# Patient Record
Sex: Male | Born: 1940 | Race: White | Hispanic: No | Marital: Married | Smoking: Former smoker
Health system: Southern US, Community
[De-identification: ages and names within clinical notes are randomized; demographics above are authoritative.]

## PROBLEM LIST (undated history)

## (undated) DIAGNOSIS — G8194 Hemiplegia, unspecified affecting left nondominant side: Secondary | ICD-10-CM

## (undated) DIAGNOSIS — I4891 Unspecified atrial fibrillation: Secondary | ICD-10-CM

## (undated) DIAGNOSIS — J45909 Unspecified asthma, uncomplicated: Secondary | ICD-10-CM

## (undated) DIAGNOSIS — N200 Calculus of kidney: Secondary | ICD-10-CM

## (undated) DIAGNOSIS — G4730 Sleep apnea, unspecified: Secondary | ICD-10-CM

## (undated) DIAGNOSIS — R0600 Dyspnea, unspecified: Secondary | ICD-10-CM

## (undated) DIAGNOSIS — I10 Essential (primary) hypertension: Secondary | ICD-10-CM

## (undated) DIAGNOSIS — J449 Chronic obstructive pulmonary disease, unspecified: Secondary | ICD-10-CM

## (undated) DIAGNOSIS — G839 Paralytic syndrome, unspecified: Secondary | ICD-10-CM

## (undated) DIAGNOSIS — F419 Anxiety disorder, unspecified: Secondary | ICD-10-CM

## (undated) DIAGNOSIS — E785 Hyperlipidemia, unspecified: Secondary | ICD-10-CM

## (undated) DIAGNOSIS — M1990 Unspecified osteoarthritis, unspecified site: Secondary | ICD-10-CM

## (undated) DIAGNOSIS — R519 Headache, unspecified: Secondary | ICD-10-CM

## (undated) DIAGNOSIS — I63239 Cerebral infarction due to unspecified occlusion or stenosis of unspecified carotid arteries: Secondary | ICD-10-CM

## (undated) DIAGNOSIS — Z950 Presence of cardiac pacemaker: Secondary | ICD-10-CM

## (undated) DIAGNOSIS — E119 Type 2 diabetes mellitus without complications: Secondary | ICD-10-CM

## (undated) DIAGNOSIS — I639 Cerebral infarction, unspecified: Secondary | ICD-10-CM

## (undated) DIAGNOSIS — F0390 Unspecified dementia without behavioral disturbance: Secondary | ICD-10-CM

## (undated) DIAGNOSIS — K219 Gastro-esophageal reflux disease without esophagitis: Secondary | ICD-10-CM

## (undated) DIAGNOSIS — R251 Tremor, unspecified: Secondary | ICD-10-CM

## (undated) HISTORY — DX: Essential (primary) hypertension: I10

## (undated) HISTORY — PX: PEG TUBE PLACEMENT: SUR1034

## (undated) HISTORY — PX: CORONARY ANGIOPLASTY: SHX604

## (undated) HISTORY — DX: Tremor, unspecified: R25.1

## (undated) HISTORY — DX: Hemiplegia, unspecified affecting left nondominant side: G81.94

## (undated) HISTORY — PX: APPENDECTOMY: SHX54

## (undated) HISTORY — PX: HERNIA REPAIR: SHX51

## (undated) HISTORY — PX: ESOPHAGOGASTRODUODENOSCOPY: SHX1529

## (undated) HISTORY — DX: Cerebral infarction due to unspecified occlusion or stenosis of unspecified carotid artery: I63.239

## (undated) HISTORY — DX: Hyperlipidemia, unspecified: E78.5

## (undated) HISTORY — PX: PACEMAKER INSERTION: SHX728

---

## 2006-04-06 ENCOUNTER — Ambulatory Visit: Payer: Self-pay | Admitting: Surgery

## 2006-04-11 ENCOUNTER — Other Ambulatory Visit: Payer: Self-pay

## 2006-04-11 ENCOUNTER — Ambulatory Visit: Payer: Self-pay | Admitting: Surgery

## 2007-11-20 ENCOUNTER — Ambulatory Visit: Payer: Self-pay | Admitting: Surgery

## 2008-12-31 ENCOUNTER — Ambulatory Visit: Payer: Self-pay | Admitting: Internal Medicine

## 2009-01-20 ENCOUNTER — Ambulatory Visit: Payer: Self-pay | Admitting: Internal Medicine

## 2009-02-18 DIAGNOSIS — E785 Hyperlipidemia, unspecified: Secondary | ICD-10-CM

## 2009-02-18 HISTORY — DX: Hyperlipidemia, unspecified: E78.5

## 2011-01-28 ENCOUNTER — Inpatient Hospital Stay: Payer: Self-pay | Admitting: Surgery

## 2011-01-31 ENCOUNTER — Ambulatory Visit: Payer: Self-pay | Admitting: Surgery

## 2011-01-31 LAB — PATHOLOGY REPORT

## 2011-05-24 ENCOUNTER — Ambulatory Visit: Payer: Self-pay | Admitting: Internal Medicine

## 2011-06-13 ENCOUNTER — Ambulatory Visit: Payer: Self-pay | Admitting: Internal Medicine

## 2011-08-02 LAB — CBC
HGB: 13.7 g/dL (ref 13.0–18.0)
MCH: 30.1 pg (ref 26.0–34.0)
MCHC: 33.7 g/dL (ref 32.0–36.0)
MCV: 90 fL (ref 80–100)
RBC: 4.54 10*6/uL (ref 4.40–5.90)
RDW: 14.9 % — ABNORMAL HIGH (ref 11.5–14.5)

## 2011-08-02 LAB — CK TOTAL AND CKMB (NOT AT ARMC)
CK, Total: 83 U/L (ref 35–232)
CK-MB: 1.4 ng/mL (ref 0.5–3.6)

## 2011-08-02 LAB — BASIC METABOLIC PANEL
Calcium, Total: 8.6 mg/dL (ref 8.5–10.1)
Chloride: 108 mmol/L — ABNORMAL HIGH (ref 98–107)
EGFR (African American): >60
EGFR (Non-African Amer.): >60
Glucose: 118 mg/dL — ABNORMAL HIGH (ref 65–99)
Osmolality: 283 (ref 275–301)
Potassium: 4.5 mmol/L (ref 3.5–5.1)
Sodium: 141 mmol/L (ref 136–145)

## 2011-08-02 LAB — TROPONIN I: Troponin-I: <0.02 ng/mL

## 2011-08-03 ENCOUNTER — Inpatient Hospital Stay: Payer: Self-pay | Admitting: Internal Medicine

## 2011-08-03 LAB — URINALYSIS, COMPLETE
Bacteria: NONE SEEN
Glucose,UR: NEGATIVE mg/dL (ref 0–75)
Ketone: NEGATIVE
Ph: 6 (ref 4.5–8.0)
Protein: NEGATIVE
RBC,UR: <1 /HPF (ref 0–5)
Specific Gravity: 1.005 (ref 1.003–1.030)
Squamous Epithelial: NONE SEEN

## 2011-08-03 LAB — PROTIME-INR
INR: 1.8
Prothrombin Time: 21.2 secs — ABNORMAL HIGH (ref 11.5–14.7)

## 2011-08-03 LAB — TROPONIN I: Troponin-I: <0.02 ng/mL

## 2011-08-03 LAB — TSH: Thyroid Stimulating Horm: 5.6 u[IU]/mL — ABNORMAL HIGH

## 2011-08-04 LAB — PROTIME-INR: INR: 1.9

## 2011-09-19 ENCOUNTER — Inpatient Hospital Stay: Payer: Self-pay | Admitting: Internal Medicine

## 2011-09-19 LAB — COMPREHENSIVE METABOLIC PANEL
Albumin: 3.9 g/dL (ref 3.4–5.0)
Anion Gap: 8 (ref 7–16)
BUN: 15 mg/dL (ref 7–18)
Co2: 26 mmol/L (ref 21–32)
Glucose: 102 mg/dL — ABNORMAL HIGH (ref 65–99)
Potassium: 4.1 mmol/L (ref 3.5–5.1)
SGOT(AST): 29 U/L (ref 15–37)
Total Protein: 7.4 g/dL (ref 6.4–8.2)

## 2011-09-19 LAB — CK TOTAL AND CKMB (NOT AT ARMC)
CK, Total: 135 U/L (ref 35–232)
CK-MB: 1.3 ng/mL (ref 0.5–3.6)

## 2011-09-19 LAB — CBC
HGB: 12.4 g/dL — ABNORMAL LOW (ref 13.0–18.0)
MCH: 30.4 pg (ref 26.0–34.0)
MCV: 92 fL (ref 80–100)
RBC: 4.07 10*6/uL — ABNORMAL LOW (ref 4.40–5.90)

## 2011-09-19 LAB — PROTIME-INR
INR: 2.1
Prothrombin Time: 24 secs — ABNORMAL HIGH (ref 11.5–14.7)

## 2011-09-19 LAB — TROPONIN I: Troponin-I: <0.02 ng/mL

## 2011-09-20 LAB — CBC WITH DIFFERENTIAL/PLATELET
Basophil #: 0.1 10*3/uL (ref 0.0–0.1)
Basophil %: 1.1 %
Eosinophil #: 0.1 10*3/uL (ref 0.0–0.7)
Eosinophil %: 2.1 %
HGB: 11.7 g/dL — ABNORMAL LOW (ref 13.0–18.0)
Lymphocyte #: 1.8 10*3/uL (ref 1.0–3.6)
Lymphocyte %: 32.6 %
MCHC: 33.8 g/dL (ref 32.0–36.0)
Neutrophil %: 53.7 %
Platelet: 138 10*3/uL — ABNORMAL LOW (ref 150–440)
RBC: 3.83 10*6/uL — ABNORMAL LOW (ref 4.40–5.90)
RDW: 15.2 % — ABNORMAL HIGH (ref 11.5–14.5)

## 2011-09-20 LAB — LIPID PANEL
Cholesterol: 109 mg/dL (ref 0–200)
HDL Cholesterol: 42 mg/dL (ref 40–60)
Ldl Cholesterol, Calc: 56 mg/dL (ref 0–100)
Triglycerides: 54 mg/dL (ref 0–200)
VLDL Cholesterol, Calc: 11 mg/dL (ref 5–40)

## 2011-09-20 LAB — BASIC METABOLIC PANEL
Anion Gap: 10 (ref 7–16)
Calcium, Total: 8.6 mg/dL (ref 8.5–10.1)
Chloride: 108 mmol/L — ABNORMAL HIGH (ref 98–107)
Creatinine: 0.87 mg/dL (ref 0.60–1.30)
EGFR (African American): >60
EGFR (Non-African Amer.): >60
Glucose: 106 mg/dL — ABNORMAL HIGH (ref 65–99)
Osmolality: 286 (ref 275–301)

## 2011-09-20 LAB — MAGNESIUM: Magnesium: 2 mg/dL

## 2011-09-20 LAB — HEMOGLOBIN A1C: Hemoglobin A1C: 7.5 % — ABNORMAL HIGH (ref 4.2–6.3)

## 2013-10-13 DIAGNOSIS — M0579 Rheumatoid arthritis with rheumatoid factor of multiple sites without organ or systems involvement: Secondary | ICD-10-CM | POA: Insufficient documentation

## 2013-10-13 DIAGNOSIS — E7800 Pure hypercholesterolemia, unspecified: Secondary | ICD-10-CM | POA: Insufficient documentation

## 2013-10-16 DIAGNOSIS — Z79899 Other long term (current) drug therapy: Secondary | ICD-10-CM | POA: Insufficient documentation

## 2014-01-24 DIAGNOSIS — Z7901 Long term (current) use of anticoagulants: Secondary | ICD-10-CM | POA: Insufficient documentation

## 2014-07-06 NOTE — Consult Note (Signed)
PATIENT NAME:  Oscar, Brock MR#:  [id number] DATE OF BIRTH:  [birth year]  DATE OF CONSULTATION:  08/03/2011  REFERRING PHYSICIAN:   CONSULTING PHYSICIAN:  Marcina Millard, MD  PRIMARY CARE PHYSICIAN: Mickey Farber, MD   PRIMARY CARDIOLOGIST: Arnoldo Hooker, MD   ELECTROPHYSIOLOGIST: Dr. Tedra Senegal, [facility]   CHIEF COMPLAINT: Palpitations.   HISTORY OF PRESENT ILLNESS: The patient is a 74 year old gentleman with known history of paroxysmal atrial fibrillation. The patient has had a longstanding history of paroxysmal atrial fibrillation. He has undergone previous cardioversions. He has most recently been on Tikosyn for the past three years. He has done relatively well with intermittent palpitations. He presented on 08/03/2011 with tachycardia with the patient knew was atrial fibrillation. When he presented to [facility] Emergency Room, EKG revealed atrial fibrillation with a rapid ventricular response with rates in the 120's to 130 bpm. The patient was admitted to telemetry where his rate has improved on his current outpatient medications including Cardizem. The patient still feels that he is in atrial fibrillation, appears to be symptomatic. He is anticoagulated with warfarin.   PAST MEDICAL HISTORY:  1. Paroxysmal atrial fibrillation.  2. Hypertension.  3. History of coronary artery disease.  4. Hyperlipidemia.  5. Rheumatoid arthritis.   MEDICATIONS:  1. Aspirin 81 mg daily.  2. Coumadin 7 mg daily.  3. Atorvastatin 20 mg daily.  4. Tikosyn 500 mg b.i.d.  5. Benadryl 25 mg t.i.d. p.r.n.  6. Finasteride 5 mg daily.  7. Magnesium gluconate 250 mg b.i.d.  8. Methotrexate 2.5 mg 7 tablets q. weekly.  9. Vicodin 5/500 1 to 2 tablets q.6 p.r.n.   SOCIAL HISTORY: The patient is married. Lives with his wife. He denies tobacco or EtOH abuse.   FAMILY HISTORY: Mother and father both with a history of congestive heart failure.   REVIEW OF SYSTEMS: CONSTITUTIONAL: No fever or chills.  EYES: No blurry vision. EARS: No hearing loss. RESPIRATORY: No shortness of breath. CARDIOVASCULAR: Palpitations and atrial fibrillation as stated above. GI: No nausea, vomiting, diarrhea, or constipation. GU: No dysuria or hematuria. ENDOCRINE: No polyuria or polydipsia. MUSCULOSKELETAL: No arthralgias or myalgias. NEUROLOGICAL: No focal muscle weakness or numbness. PSYCHOLOGICAL: No depression or anxiety.   PHYSICAL EXAMINATION:   VITAL SIGNS: Blood pressure 116/70, pulse 72, respirations 18, temperature 98.6, pulse oximetry 96%.   HEENT: Pupils equal and reactive to light and accommodation.   NECK: Supple without thyromegaly.   LUNGS: Clear.   CARDIOVASCULAR: Normal JVP. Normal PMI. Irregular irregular rhythm. Normal S1, S2. No appreciable gallop, murmur, or rub.   ABDOMEN: Soft, nontender. Pulses were intact bilaterally.   MUSCULOSKELETAL: Normal muscle tone.   NEUROLOGIC: The patient is alert and oriented x3. Motor and sensory both grossly intact.   IMPRESSION: This is a 74 year old gentleman with a history of paroxysmal atrial fibrillation who presents with a prolonged episode of atrial fibrillation. The patient has ruled out for myocardial infarction by CPK isoenzymes and troponin. The patient is subtherapeutic on warfarin with an INR of 1.8.   RECOMMENDATIONS:  1. Adjust warfarin dose to achieve target INR of 2 to 3. 2. Agree with low dose Cardizem which appears to be beneficial in improving ventricular rate.  3. Consider repeat cardioversion while patient is still on Tikosyn with the addition of low dose Cardizem. 4. Consider referral to Dr. Tedra Senegal for possible catheter ablation of atrial fibrillation which has failed Tikosyn.  5. Will discuss further options with Dr. Gwen Pounds who is his primary cardiologist.  ____________________________  Marcina Millard, MD ap:drc D: 08/03/2011 13:53:20 ET T: 08/03/2011 14:20:18 ET JOB#: [id number]  cc: Marcina Millard, MD,  <Dictator> Marcina Millard MD ELECTRONICALLY SIGNED 08/25/2011 17:39

## 2014-07-06 NOTE — Consult Note (Signed)
PATIENT NAME:  Oscar Brock, Oscar Brock MR#:  [id number] DATE OF BIRTH:  03/14/1941  DATE OF CONSULTATION:  09/20/2011  REFERRING PHYSICIAN:  Dr. Shaune Pollack  CONSULTING PHYSICIAN:  Hemang K. Sherryll Burger, MD  PRIMARY CARE PHYSICIAN: Dr. Mickey Farber CARDIOLOGIST: Dr. Gwen Pounds  REASON FOR CONSULTATION: Left upper extremity weakness.   HISTORY OF PRESENT ILLNESS: Mr. Bangs is a 74 year old Caucasian gentleman with a history of proximal atrial fibrillation, had an acute onset of left upper extremity unusual sensation such as something running through his vein around 11:30 on 09/19/2011.   All of a sudden his left upper extremity became limp and he could not move it for almost 20 minutes and he then started having some strength back.   At that time he also had some blurred vision and felt a little bit dizzy.   He did not have any palpitation sensation at that time. His hand stayed a little bit weak all day but the next morning it has been almost back to normal.   Patient has been on anticoagulation with Coumadin due to paroxysmal atrial fibrillation since around 2003 or so.   He also takes 81 mg aspirin but he has frequent bruising.   Patient denied any history suggestive of transient ischemic attack or stroke before that.   PAST MEDICAL HISTORY:  1. Paroxysmal atrial fibrillation on Tikosyn. 2. Hypertension. 3. Coronary artery disease. 4. Rheumatoid arthritis.  5. Hyperlipidemia.  6. Diet-controlled diabetes. 7. Valvular heart disease.   PAST SURGICAL HISTORY:  1. Hernia repair.  2. Appendectomy.  3. History of angioplasty.   SOCIAL HISTORY: Significant that he is married. He lives with his wife. He is retired from [employer]. He does not use any tobacco, alcohol or drugs.   FAMILY HISTORY: Significant for both the parents with congestive heart failure, brother with a stroke, and sister with transient ischemic attack.   ALLERGIES: He is allergic to Darvocet and it causes distress.    MEDICATIONS: I reviewed home medication list.   REVIEW OF SYSTEMS: I asked him 10 system review of system which was negative except history of present illness.   PHYSICAL EXAMINATION:  GENERAL: He is tall Caucasian gentleman here with his wife wearing glasses in a hospital gown.   VITAL SIGNS: Temperature 98, pulse 48, respiratory rate 16, blood pressure 126/68, pulse oximetry 98%.   LUNGS: Clear to auscultation.   HEART: S1, S2 heart sounds, regular. Carotid exam did not reveal any bruit.   Funduscopic exam was attempted.   MENTAL STATUS EXAMINATION: He was alert, oriented. He followed two-step inverted commands. His language seems to be intact. His attention, concentration, and memory seem to be appropriate for his age and medical condition.   On his cranial nerves his pupils were equal, round, and reactive. Extraocular movements are intact. His visual fields seemed full. He has decreased hearing bilaterally. His face was symmetric. Tongue was midline. Facial sensations were intact.   His shoulder shrug was 5/5.   On his motor examination, he has normal tone and strength of 5/5 including his left upper extremity. He was not able to snap his fingers because of his long-standing arthritis.   His sensations were intact to light touch except some decreased sensation in both feet and hammer toes.   His reflexes were intact except he has decreased ankle jerk bilaterally.   His gait was slow and a little bit unsteady but otherwise unremarkable and he feels like his baseline.   LABORATORY, DIAGNOSTIC AND RADIOLOGICAL DATA: I  reviewed his MRI of the brain which showed a very small punctate area of DWI hyperintensity corresponding with ADC hypointensity.   He also has some other white matter microvascular ischemic changes.   I reviewed his other radiological data which was unremarkable.   His labs were also okay.   He has mild elevation of his chloride. His hemoglobin A1c was 7.5.    He is mildly anemic.   His INR was 2.1.   ASSESSMENT AND PLAN:  1. Ischemic stroke, acute onset of left upper extremity weakness lasting for 20 minutes, complete paralysis but having some apraxia and some weakness for almost more than 12 hours likely represent stroke. He also has a very small DWI change on his MRI of the brain which will classify him as a "minor stroke".    I will hopeful for him that he will recover completely from this injury but I am afraid that he is at high risk of having recurrent infarct.   I talked to the patient about signs and symptoms of stroke and importance of calling 911.   He will be a tough TPA decision because his INR is usually therapeutic and therapeutic INR he won't be able to get TPA even though he has acute onset of strokelike symptoms.   I explained this dilemma to the patient.   Right now his ischemia seemed to be in his right middle cerebral artery territory small branch infarct.   I tried to explain the pathophysiology to the patient of embolic phenomenon with self or spontaneous recanalization, etc.   Patient needs to have better control of his primary risk factors. He is already on Coumadin for his atrial fibrillation and now he is on sinus rhythm.   He still has some bradycardia but I will let his Dr. Gwen Pounds or his hospitalist physician make a decision on Cardizem.   His blood pressure is well controlled. He needs to have tighter control of his diabetes.   He has not been smoking for at least 23 years.   I talked to him about other ways of preventing his recurrent strokes.   2. Patient explained that he has occasional zigzag lines in front of his eyes, happens once a month or so, can last for around 2 to 5 minutes which sounds like migraine accompaniment.   He is already taking magnesium supplement. He can consider taking dolovent.   3. History of concussion. Wife wanted to know whether he has any signs of concussion on his MRI of  the brain but on asking further details it actually happened when he was [age] years of age and he had concussion symptoms for only one day.   I explained to the wife that we might not see any changes on his MRI of the brain now.   I will see him back in the clinic in 1 to 2 months.  Feel free to contact me with any further questions. Patient will continue to take his aspirin and Coumadin. There is no change in medication from my side.   ____________________________ Durene Cal. Sherryll Burger, MD hks:cms D: 09/20/2011 19:16:02 ET T: 09/21/2011 07:39:56 ET JOB#: [id number]  cc: Hemang K. Sherryll Burger, MD, <Dictator> Neomia Dear. Harrington Challenger, MD Lamar Blinks, MD Durene Cal [facility] MD ELECTRONICALLY SIGNED 09/28/2011 7:59

## 2014-07-06 NOTE — Consult Note (Signed)
Past Medical/Surgical Hx:  Hyperlipidemia:   MI - Myocardial Infarct:   Borderline Diabetes:   Rheumatoid Arthritis:   Atrial Fibrillation:   Appendectomy:   Hernia Repair:   Angioplasty:   Home Medications: Medication Instructions Last Modified Date/Time  Tikosyn 500 mcg oral capsule 1 cap(s) orally 2 times a day 08-Jul-13 20:35  atorvastatin 20 mg oral tablet 1 tab(s) orally once a day (at bedtime) 08-Jul-13 20:35  aspirin delayed release capsule 81 mg 1 cap(s) orally once a day 08-Jul-13 20:35  Benadryl Dye Free Allergy 25 mg oral tablet 1 tab(s) orally once a day (at bedtime) and As Needed for allergies  08-Jul-13 20:35  Cardizem CD 120 mg/24 hours oral capsule, extended release 1 cap(s) orally 2 times a day 08-Jul-13 20:35  Coumadin 6 mg oral tablet 1 tab(s) orally once a day 08-Jul-13 20:35  Coumadin 1 mg oral tablet 1 tab(s) orally once a day 08-Jul-13 20:35  finasteride 5 mg oral tablet 1 tab(s) orally once a day 08-Jul-13 20:35  magnesium gluconate 250 mg oral tablet 1 tab(s) orally 3 times a day 08-Jul-13 20:35  methotrexate tablet 2.5 mg 7 tab(s) orally once a week 08-Jul-13 20:35  Vicodin 5 mg-500 mg oral tablet 1 tab(s) orally every 6 hours, As Needed- for Pain  08-Jul-13 20:35  omeprazole 20 mg oral delayed release capsule 1 cap(s) orally once a day 08-Jul-13 20:35   Allergies:  Darvocet - N: GI Distress  Vital Signs: **Vital Signs.:   09-Jul-13 15:56   Temperature Temperature (F) 98   Celsius 36.6   Temperature Source oral   Pulse Pulse 48   Systolic BP Systolic BP 235   Diastolic BP (mmHg) Diastolic BP (mmHg) 68   Mean BP 87   Pulse Ox % Pulse Ox % 98   Pulse Ox Activity Level  At rest   Oxygen Delivery Room Air/ 21 %   Lab Results: Hepatic:  08-Jul-13 14:05    Bilirubin, Total 0.9   Alkaline Phosphatase 69   SGPT (ALT) 41 (12-78 NOTE: NEW REFERENCE RANGE 02/04/2011)   SGOT (AST) 29   Total Protein, Serum 7.4   Albumin, Serum 3.9  Routine Chem:   09-Jul-13 04:33    Glucose, Serum  106   BUN 13   Creatinine (comp) 0.87   Sodium, Serum 143   Potassium, Serum 4.4   Chloride, Serum  108   CO2, Serum 25   Calcium (Total), Serum 8.6   Anion Gap 10   Osmolality (calc) 286   eGFR (African American) >60   eGFR (Non-African American) >60 (eGFR values <60m/min/1.73 m2 may be an indication of chronic kidney disease (CKD). Calculated eGFR is useful in patients with stable renal function. The eGFR calculation will not be reliable in acutely ill patients when serum creatinine is changing rapidly. It is not useful in  patients on dialysis. The eGFR calculation may not be applicable to patients at the low and high extremes of body sizes, pregnant women, and vegetarians.)   Magnesium, Serum 2.0 (1.8-2.4 THERAPEUTIC RANGE: 4-7 mg/dL TOXIC: > 10 mg/dL  -----------------------)   Hemoglobin A1c ([facility])  7.5 (The American Diabetes Association recommends that a primary goal of therapy should be <7% and that physicians should reevaluate the treatment regimen in patients with HbA1c values consistently >8%.)   Cholesterol, Serum 109   Triglycerides, Serum 54   HDL (INHOUSE) 42   VLDL Cholesterol Calculated 11   LDL Cholesterol Calculated 56 (Result(s) reported on 20 Sep 2011 at 05:35AM.)  Cardiac:  08-Jul-13 14:05    Troponin I < 0.02 (0.00-0.05 0.05 ng/mL or less: NEGATIVE  Repeat testing in 3-6 hrs  if clinically indicated. >0.05 ng/mL: POTENTIAL  MYOCARDIAL INJURY. Repeat  testing in 3-6 hrs if  clinically indicated. NOTE: An increase or decrease  of 30% or more on serial  testing suggests a  clinically important change)   CK, Total 135   CPK-MB, Serum 1.3 (Result(s) reported on 19 Sep 2011 at 02:52PM.)  Routine Coag:  08-Jul-13 14:05    Prothrombin  24.0   INR 2.1 (INR reference interval applies to patients on anticoagulant therapy. A single INR therapeutic range for coumarins is not optimal for all indications; however, the  suggested range for most indications is 2.0 - 3.0. Exceptions to the INR Reference Range may include: Prosthetic heart valves, acute myocardial infarction, prevention of myocardial infarction, and combinations of aspirin and anticoagulant. The need for a higher or lower target INR must be assessed individually. Reference: The Pharmacology and Management of the Vitamin K  antagonists: the seventh ACCP Conference on Antithrombotic and Thrombolytic Therapy. BWIOM.3559 Sept:126 (3suppl): [id number]. A HCT value >55% may artifactually increase the PT.  In one study,  the increase was an average of 25%. Reference:  "Effect on Routine and Special Coagulation Testing Values of Citrate Anticoagulant Adjustment in Patients with High HCT Values." American Journal of Clinical Pathology 2006;126:400-405.)  Routine Hem:  09-Jul-13 04:33    WBC (CBC) 5.6   RBC (CBC)  3.83   Hemoglobin (CBC)  11.7   Hematocrit (CBC)  34.6   Platelet Count (CBC)  138   MCV 90   MCH 30.5   MCHC 33.8   RDW  15.2   Neutrophil % 53.7   Lymphocyte % 32.6   Monocyte % 10.5   Eosinophil % 2.1   Basophil % 1.1   Neutrophil # 3.0   Lymphocyte # 1.8   Monocyte # 0.6   Eosinophil # 0.1   Basophil # 0.1 (Result(s) reported on 20 Sep 2011 at 05:15AM.)   Radiology Results: [place]:    08-Jul-13 17:32, US Carotid Doppler Bilateral   US Carotid Doppler Bilateral    REASON FOR EXAM:    eval for stenosis  COMMENTS:       PROCEDURE: [place]  - US CAROTID DOPPLER BILATERAL  - Sep 19 2011  5:32PM     RESULT: Comparison: None    Technique: Gray-scale, color Doppler, and spectral Doppler images were   obtained of the extracranial carotid artery systems and vertebral   arteries in the neck.    Findings:  Mild atherosclerotic plaques are demonstrated in the carotid bulbs and   internal carotid arteries bilaterally. By visual inspection, there is   less than 50% stenosis.The peak systolic velocities are not elevated.     The right  ICA to CCA ratio is 1.3. The left ICA to CCA ratio is 0.7.    The vertebral arteries are patent bilaterally.    IMPRESSION:    No evidence of hemodynamically significant stenosis in the extracranial   carotid arteries.      Dictation Site: 8          Verified By: Gregor Hams, M.D., MD  MRI:    09-Jul-13 13:43, MRI Brain Without Contrast   MRI Brain Without Contrast    REASON FOR EXAM:    CVA  COMMENTS:       PROCEDURE: MR  - MR BRAIN WO CONTRAST  -  Sep 20 2011  1:43PM     RESULT: History: CVA.    Comparison Study: Head CT of 09/19/2011 .    Findings: Multiplanar, multisequence imaging the brain is obtained.   Diffusion-weighted images reveal punctate region of  acute ischemia in   the posterior right parietal cortex seen on image #21. Posterior fossa is   unremarkable. White matter changes noted consistent chronic ischemia   noted.    IMPRESSION:  Punctate area of acute ischemia noted in the posterior right   parietal cortex .          Verified By: Osa Craver, M.D., MD  CT:    08-Jul-13 15:03, CT Head Without Contrast   CT Head Without Contrast    REASON FOR EXAM:    left arm weakness  - on coumadin   eval for cva/bleed  COMMENTS:       PROCEDURE: CT  - CT HEAD WITHOUT CONTRAST  - Sep 19 2011  3:03PM     RESULT: Comparison:  None    Technique: Multiple axial images from the foramen magnum to the vertex   were obtained without IV contrast.    Findings:      There is no evidence of mass effect, midline shift, or extra-axial fluid   collections.  There is no evidence of a space-occupying lesion or   intracranial hemorrhage. There is no evidence of a cortical-based area of     acute infarction.      The ventricles and sulci are appropriate for the patient's age. The basal   cisterns are patent.    Visualized portions of the orbits are unremarkable. The visualized   portions of the paranasal sinuses and mastoid air cells are unremarkable.      The osseous structures are unremarkable.    IMPRESSION:      No acute intracranial process.      Dictation Site: 1          Verified By: Jennette Banker, M.D., MD   Impression/Recommendations:  Recommendations:   Please see my dictation for details.Tt. MCA very small (punctate) infract - causing left UE weakness which has resolved, known A.Fib on coumadin and ASA.of the work up unremarkable.explained pt stroke S/S and need to call 911 if he has any new stroke.He will be difficult tPA decision as he is usually therapeutic dose of comadin. Migraine accompnainmenth/o concussion - stablefor the opportunity to participate in care of your patient.free to contact me with any further questions on this pt, I will follow this patient on PRN basis. pt f/up 1-2 months.  Electronic Signatures: Ray Church (MD)  (Signed 09-Jul-13 17:50)  Authored: PAST MEDICAL/SURGICAL HISTORY, HOME MEDICATIONS, ALLERGIES, NURSING VITAL SIGNS, LAB RESULTS, RADIOLOGY RESULTS, Recommendations   Last Updated: 09-Jul-13 17:50 by Ray Church (MD)

## 2014-07-06 NOTE — H&P (Signed)
PATIENT NAME:  Oscar Brock, GANN MR#:  [id number] DATE OF BIRTH:  Jan 22, 1941  DATE OF ADMISSION:  09/19/2011  REFERRING PHYSICIAN: Jene Every, MD  PRIMARY CARE PHYSICIAN: Mickey Farber, MD  CARDIOLOGIST: Arnoldo Hooker, MD  CHIEF COMPLAINT: Left upper extremity weakness.   HISTORY OF PRESENT ILLNESS: The patient is a pleasant 74 year old Caucasian male with history of paroxysmal atrial fibrillation on Tikosyn and a calcium channel blocker for rate control and on Coumadin as well with therapeutic INR who presents with left upper extremity weakness. The patient started having the weakness about 11:30 and there were no other limbs involved. He also had some numbness. There was no speech disturbances or facial asymmetry per wife who was there. He went to his PCP who noted significant left upper extremity weakness, probably 2 out of 5, per the ER physician. EMS was called and the patient was brought here. He has a CT of the head which is negative for any acute intracranial pathology and INR is 2.1. His troponin is negative. The patient stated that he did feel some palpitations and skipping of his heart rate while he had the symptoms. Symptoms started to improve after 20 minutes and currently is much improved. EKG also shows sinus rhythm. Hospitalist service was contacted for further evaluation and management.   PAST MEDICAL HISTORY:  1. Paroxysmal atrial fibrillation, on Tikosyn.  2. Hypertension. 3. Coronary artery disease. 4. Rheumatoid arthritis.  5. Hyperlipidemia.  6. Diet-controlled diabetes. 7. Valvular heart disease.   PAST SURGICAL HISTORY:  1. Hernia repair. 2. Appendectomy. 3. History of angioplasty.   SOCIAL HISTORY: He is married and living with wife, retired from the [employer]. No tobacco, alcohol, or drug use.   FAMILY HISTORY: Both parents with congestive heart failure. Brother with stroke and sister with a transient ischemic attack.   ALLERGIES: Darvocet causes I  distress.   MEDICATIONS:  1. Aspirin 81 mg daily.  2. Lipitor 20 mg daily.  3. Benadryl dye free 25 mg once a day at bedtime as needed for allergies.  4. Cardizem CD 120 mg twice a day. 5. Coumadin 7 mg once a day. 6. Finasteride 5 mg daily.  7. Magnesium gluconate 250 mg three times daily. 8. Methotrexate 2.5 mg 7 tablets once a week, last took 09/15/2011. 9. Omeprazole 20 mg daily.  10. Tikosyn 500 mcg one cap twice a day. 11. Vicodin 5/500 mg one tab every six hours as needed for pain.   REVIEW OF SYSTEMS: CONSTITUTIONAL: No fever or fatigue. Some global weakness including weakness in the left upper extremity. No weight changes. EYES: Transient blurry vision, resolved. No glaucoma. ENT: No tinnitus or hearing loss. RESPIRATORY: No cough, wheezing, hemoptysis, or chronic obstructive pulmonary disease. CARDIOVASCULAR: No chest pain or orthopnea. Did have some palpitations earlier. Has history of high blood pressure and paroxysmal atrial fibrillation. GI: No nausea, vomiting, diarrhea, abdominal pain, or rectal bleeding. GENITOURINARY: Denies dysuria. Has some nocturia. No incontinence. ENDOCRINE: No polyuria, nocturia, or thyroid problems. HEME/LYMPH: No anemia or easy bruising. SKIN: No new rashes. MUSCULOSKELETAL: Has chronic arthritis and rheumatoid arthritis. NEURO: Numbness in the left upper extremity, much improved. Also weakness in the same limb. No history of cerebrovascular accident or transient ischemic attack. No seizures or dementia. PSYCH: No anxiety. Some insomnia.      PHYSICAL EXAMINATION:   VITAL SIGNS: Temperature on arrival 98.2, pulse rate 59, respiratory rate 18, blood pressure 131/70, and oxygen saturation 96% on room air.   GENERAL: The patient is  a pleasant Caucasian male lying in bed in no obvious distress talking in full sentences.   HEENT: Normocephalic, atraumatic. Pupils are equal and reactive. Extraocular muscles intact. Anicteric sclerae. Moist mucous  membranes.   NECK: Supple. No thyroid tenderness.   CARDIOVASCULAR: S1 and S2 bradycardic. No murmurs appreciated.   LUNGS: Clear to auscultation without wheezing or rales.   ABDOMEN: Soft, nontender, and nondistended. Normoactive bowel sounds in all quadrants. No organomegaly noted.   SKIN: No obvious rashes.   EXTREMITIES: No significant lower extremity edema.   NEUROLOGICAL: Cranial nerves II through XII appear to be grossly intact. Strength in the left upper extremity is 4+/5, otherwise 5 out of 5 in all the other extremities. Sensation is intact to light touch and equivocal. Rapid alternating movements intact. Intact nose-to-finger touch.  PSYCH: Awake, alert, and oriented x3. Pleasant and conversant.  LABORATORY, DIAGNOSTIC AND RADIOLOGIC DATA: Glucose 102, creatinine 1.03, sodium 142, potassium 4.1, chloride 108, and CO2 26. LFTs within normal limits. Troponin negative. CK-MB 1.3. WBC 6.9, hemoglobin 12.4, hematocrit 37.3, and platelets 159. INR is 2.1.   EKG shows sinus bradycardia at rate 54. No acute ST elevations or depressions. QTc 491 ms.   CT of the head without contrast shows no acute intracranial process.  ASSESSMENT AND PLAN: We have a pleasant 74 year old Caucasian male with paroxysmal atrial fibrillation, hypertension, hyperlipidemia, and rheumatoid arthritis presenting with left upper extremity weakness and numbness much improved, cerebrovascular accident likely versus transient ischemic attack. At this point, his strength is much improved, is 4 out of 5 without any other neurological deficits, the face is symmetrical and a CT of the head is negative. The patient is on Coumadin and INR is therapeutic. The patient did have some palpitations at the time of the symptoms. I will place him on telemetry to evaluate any arrhythmias. I would check carotid ultrasounds as well as a MRI of the brain to look for evidence for CVA. I did discuss the case with Dr. Gwen Pounds, his  cardiologist, and at this point he is in sinus, rate controlled, has a therapeutic INR and resolving symptoms. He recommended continuing the Coumadin for now and would also check a MRI as well as ultrasound to look for any blockages and resume the statin. The blood pressure is stable. Would check lipids. Would also check a hemoglobin A1c. We would also consult with cardiology for further recommendations.  CODE STATUS: THE PATIENT IS FULL CODE.   TOTAL TIME SPENT: 50 minutes.  ____________________________ Krystal Eaton, MD sa:slb D: 09/19/2011 16:53:08 ET T: 09/19/2011 17:09:54 ET JOB#: [id number]  cc: Krystal Eaton, MD, <Dictator> Neomia Dear. Harrington Challenger, MD Krystal Eaton MD ELECTRONICALLY SIGNED 10/03/2011 16:16

## 2014-07-06 NOTE — Discharge Summary (Signed)
PATIENT NAME:  Oscar Brock, Oscar Brock MR#:  [id number] DATE OF BIRTH:  June 15, 1940  DATE OF ADMISSION:  08/03/2011 DATE OF DISCHARGE:  08/04/2011  PRESENTING COMPLAINT: Palpitations and chest pain along with lightheadedness.   DISCHARGE DIAGNOSES:  1. Recurrent atrial fibrillation, now in sinus rhythm.    MEDICATIONS: 1. Finasteride 5 mg p.o. daily.  2. Vicodin 5/500, 2 tablets q. 6 p.r.n.  3. Methotrexate 2.5 mg, 7 tablets once a week.  4. Aspirin 81 mg daily.  5. Coumadin 7 mg daily.  6. Tikosyn 500 mcg p.o. b.i.d.  7. Magnesium gluconate 250 mg p.o. twice a day.  8. Atorvastatin 20 mg p.o. at bedtime.  9. Benadryl 25 mg 1 tablet 3 times a day as needed.  10. Cardizem CD 120 mg p.o. daily.   FOLLOWUP: Follow up with Dr. Gwen Pounds Monday June 03 at 3:00 p.m.   PROCEDURES: None.   CONSULTATIONS:  Cardiology consultation with Dr. Gwen Pounds.   LABORATORY DATA: EKG at discharge: Normal sinus rhythm. PT-INR 22.5-1.9. Cardiac enzymes times three negative.  Chest x-ray unremarkable. Urinalysis negative for urinary tract infection. CBC within normal limits. D-dimer less than 0.22.   HISTORY OF PRESENT ILLNESS: Oscar Brock is a pleasant 74 year old Caucasian gentleman with a history of atrial fibrillation in the past who has undergone cardioversion in the past comes in with:  1. Palpitations, dizziness, and lightheadedness: He was found to have recurrence of atrial fibrillation with rapid ventricular response. The patient had been on Tikosyn started by physiologist at [facility] for the last three years. He started having dizziness along with chest pain. He was started on p.o. Cardizem t.i.d. dose, which was increased to Cardizem CD 120 mg along with p.o. Tikosyn, and he was back in normal sinus rhythm. There was no indication for cardioversion for now. The patient was seen by Dr. Gwen Pounds and will follow up with him as an outpatient. He will continue his Coumadin as above, his home dose. His INR was mildly  subtherapeutic. Lovenox was continued in the hospital.  2. Chest pain, which appeared atypical in the setting of atrial fibrillation: Cardiac enzymes remained negative. P.r.n. morphine was given.  3. History of coronary artery disease: The patient remained on aspirin.  4. Hypercholesterolemia: Atorvastatin was continued.  5. Rheumatoid arthritis: The patient is on methotrexate.   Hospital stay otherwise remained stable.   CODE STATUS: The patient remained a FULL CODE.   TIME SPENT: 40 minutes.      ____________________________ Wylie Hail Allena Katz, MD sap:bjt D: 08/04/2011 16:43:50 ET T: 08/08/2011 09:39:06 ET JOB#: [id number]  cc: Thailan Sava A. Allena Katz, MD, <Dictator> Lamar Blinks, MD Willow Ora MD ELECTRONICALLY SIGNED 08/12/2011 22:47

## 2014-07-06 NOTE — H&P (Signed)
PATIENT NAME:  Oscar Brock, Oscar Brock MR#:  [id number] DATE OF BIRTH:  April 15, 1940  DATE OF ADMISSION:  08/03/2011  PRIMARY CARE PHYSICIAN: Dr. Mickey Farber    CHIEF COMPLAINT: Palpitations and chest pain and lightheadedness.   HISTORY OF PRESENT ILLNESS: Oscar Brock is a 74 year old pleasant Caucasian male with past medical history of paroxysmal atrial fibrillation converted chemically to normal sinus rhythm and he maintained his rhythm in sinus for a few years. He reported over the last one month he will have very brief palpitations feeling but it will subside and then he will have regular rhythm, however, since yesterday morning he is feeling palpitations, irregular heartbeats, skipping beats feeling associated with dizziness. He indicates that he will have slight shortness of breath whenever his heart is racing in addition to the dizziness. Additionally, in the late afternoon he also describes some chest pain that starts from the back of the left side of the chest and radiates anteriorly. It is dull type of pain. The severity was 5 on a scale of 10. It is a steady pain. He is unsure if it is related to his arthritis. He denies having any cough. No hemoptysis. No muscular pain. No calf tenderness or swelling and no edema. Patient came to the Emergency Department for evaluation and he was found to be in atrial fibrillation with rapid ventricular rate. Patient was admitted for further evaluation and treatment after receiving intravenous metoprolol.    REVIEW OF SYSTEMS: CONSTITUTIONAL: Denies any fever. No chills. No fatigue. EYES: No blurring of vision. No double vision. ENT: No hearing impairment. No sore throat. No dysphagia. CARDIOVASCULAR: Reports the chest pain. He has shortness of breath only when his heat is racing. No edema. No syncope. RESPIRATORY: No cough. No sputum production. No hemoptysis. He had some chest pain. GASTROINTESTINAL: No abdominal pain. No vomiting. No diarrhea. GENITOURINARY: No dysuria. No  frequency of urination. MUSCULOSKELETAL: No joint swelling but he has arthritis. No muscular pain or swelling. INTEGUMENTARY: No skin rash. No ulcers. NEUROLOGY: No focal weakness. No seizure activity. No headache. PSYCHIATRY: No anxiety. No depression. ENDOCRINE: No heat or cold intolerance. No polyuria or polydipsia.   PAST MEDICAL HISTORY:  1. History of paroxysmal atrial fibrillation maintained in sinus rhythm on Tikosyn. 2. Systemic hypertension. 3. Coronary disease.  4. Rheumatoid arthritis.  5. Hyperlipidemia.   PAST SURGICAL HISTORY:  1. Hernia repair. 2. Appendectomy last year. 3. History of angioplasty.   SOCIAL HABITS: Nonsmoker. No history of alcohol or drug abuse.   SOCIAL HISTORY: He is married, living with his wife. 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   FAMILY HISTORY: Both parents are deceased and they suffered from congestive heart failure.   ADMISSION MEDICATIONS:  1. Aspirin 81 mg a day. 2. Atorvastatin or Lipitor 20 mg a day.  3. Benadryl 25 mg 3 times a day p.r.n.  4. Coumadin 7 mg once a day. 5. Finasteride 5 mg once a day.  6. Magnesium gluconate 250 mg twice a day.  7. Methotrexate 2.5 mg he takes 7 tablets once a week. 8. Tikosyn 500 mg twice a day. 9. Vicodin 5/500, 1 to 2 tablets q.6 hours p.r.n.   ALLERGIES: Darvocet causing some GI upset.   PHYSICAL EXAMINATION:  VITAL SIGNS: Blood pressure 134/77, respiratory rate 16, pulse earlier was 117, now is down to 70 to 80, temperature 98.7, oxygen saturation 96%.   GENERAL APPEARANCE: Elderly male laying in bed in no acute distress.  HEAD AND NECK EXAMINATION: No pallor. No icterus. No cyanosis.   ENT: Hearing was normal. Nasal mucosa, lips, tongue were normal. He has dentures.   EYES: Normal eyelids and conjunctiva. Pupils about 6 mm, equal and reactive to light.   NECK: Supple. Trachea at midline. No thyromegaly. No cervical lymphadenopathy.   HEART: Regular  S1, S2. No S3, S4. No murmur. No gallop. No carotid bruits.   RESPIRATORY: Normal breathing pattern without use of accessory muscles. No rales. No wheezing.   ABDOMEN: Soft without tenderness. No hepatosplenomegaly. No masses. No hernias.   SKIN: No ulcers. No subcutaneous nodules.   MUSCULOSKELETAL: No joint swelling. No clubbing.   NEUROLOGIC: Cranial nerves II through XII are intact. No focal motor deficit.   PSYCHIATRY: Patient is alert, oriented x3. Mood and affect were normal.   LABORATORY, DIAGNOSTIC, AND RADIOLOGICAL DATA: EKG showed atrial fibrillation with rapid ventricular rate at 117 per minute. Tendency towards low voltage. Nonspecific T wave abnormalities in lead 3. Otherwise unremarkable EKG. Chest x-ray showed no acute cardiopulmonary abnormalities, no consolidation, no effusion. Serum glucose 118, BUN 14, creatinine 0.9, sodium 141, potassium 4.5, total CPK 83, troponin less than 0.02. CBC showed white count 7000, hemoglobin 13, hematocrit 40, platelet count 193. D-dimer less than 0.2. Urinalysis was unremarkable. Prothrombin time unfortunately was not done.   ASSESSMENT:  1. Recurrence of atrial fibrillation with rapid ventricular rate. 2. Atypical chest pain.  3. Systemic hypertension.  4. Coronary disease.  5. Rheumatoid arthritis.  6. Hypercholesterolemia.  7. History of appendectomy. 8. Hernia repair.   PLAN: Admit the patient to the telemetry unit. His heart rate is now down in the normal rate after receiving metoprolol. Consult cardiology, Dr. Gwen Pounds, who is the cardiologist of this patient. We need to know whether the patient is adequately anticoagulated or not. I ordered a STAT prothrombin time and INR. If he is subtherapeutic we need to start Lovenox or heparin. Check TSH. I will start him on diltiazem 30 mg 3 times a day and continue the rest of his medications as listed above. I asked the patient if he has any LIVING WILL. He does not have nor had appointed  anybody to have the power of attorney to make decisions.   TIME SPENT ON EVALUATING THIS PATIENT: Took more than 55 minutes.    ____________________________ Carney Corners. Rudene Re, MD amd:cms D: 08/03/2011 03:26:09 ET T: 08/03/2011 08:00:10 ET JOB#: [id number]  cc: Carney Corners. Rudene Re, MD, <Dictator> Neomia Dear. Harrington Challenger, MD Zollie Scale MD ELECTRONICALLY SIGNED 08/04/2011 5:17

## 2014-07-06 NOTE — Discharge Summary (Signed)
PATIENT NAME:  Oscar Brock, Oscar Brock MR#:  [id number] DATE OF BIRTH:  1940-06-08  DATE OF ADMISSION:  09/19/2011 DATE OF DISCHARGE:  09/20/2011  PRIMARY CARE PHYSICIAN: Dr. Mickey Farber.   CONSULTING PHYSICIANS:  1. Dr. Sherryll Burger. 2. Dr. Gwen Pounds.   DISCHARGE DIAGNOSES:  1. Transient ischemic attack and cerebrovascular accident.   2. Hypertension.  3. History of atrial fibrillation on Coumadin.  4. Hyperlipidemia.  5. Coronary artery disease.  6. Diabetes.   CORE MEASURE: Stroke.   HOME MEDICATIONS:  1. Tikosyn 500 mcg p.o. capsule one cap b.i.d.  2. Atorvastatin 20 mg p.o. at bedtime.  3. Aspirin 81 mg p.o. daily.  4. Benadryl Dye Free Allergy 25 mg p.o. tablets 1 tablet p.o. at bedtime p.r.n. for allergies.  5. Cardizem CD 120 mg one cap p.o. twice a day.  6. Coumadin 60 mg p.o. daily.  7. Coumadin 1 mg p.o. daily.  8. Finasteride 5 mg p.o. daily.  9. Magnesium Gluconate 250 mg p.o. one tablet t.i.d.  10. Methotrexate 2.5 mg p.o. seven tablets once a week.  11. Vicodin 5 mg/500 mg p.o. q.6h. p.r.n.  12. Omeprazole 20 mg p.o. one cap daily.   DIET: Low sodium ADA diet.   ACTIVITY: As tolerated.   FOLLOW-UP CARE:  1. Follow-up with PCP within one week.  2. Follow-up INR.  3. Follow-up with Dr. Sherryll Burger p.r.n.  4. Follow-up with Dr. Gwen Pounds.    REASON FOR ADMISSION: Left upper extremity weakness.   HOSPITAL COURSE: The patient is a 74 year old Caucasian male with a history of paroxysmal atrial fibrillation, on Tikosyn and calcium channel blocker for rate control and Coumadin with a supertherapeutic INR who presented to the ED with left upper arm extremity weakness which lasted about 20 minutes and resolved. The patient denies any slurred speech, facial droop or dysphagia. CT scan of head in the ED was negative. The patient's INR was 2.1. Troponin was negative. He was admitted for transient ischemic attack, rule out cerebrovascular accident. After admission, the patient had carotid duplex  which was negative for stenosis. The patient got a MRI which showed punctate acute ischemia. After admission, the patient was treated with Coumadin and aspirin. Dr. Gwen Pounds suggested no further cardiac work-up since the patient's MRI shows acute cerebrovascular accident. We requested Dr. Sherryll Burger for consult. According to Dr. Sherryll Burger, the patient may continue current treatment, discharge to home and follow-up in p.r.n. The patient is clinically stable and was discharged to home last night.   TIME SPENT: About 35 minutes.   ____________________________ Shaune Pollack, MD qc:ap D: 09/21/2011 16:27:17 ET T: 09/22/2011 10:30:06 ET JOB#: [id number]  cc: Shaune Pollack, MD, <Dictator> Neomia Dear. Harrington Challenger, MD Shaune Pollack MD ELECTRONICALLY SIGNED 09/22/2011 14:55

## 2014-07-06 NOTE — Consult Note (Signed)
PATIENT NAME:  Oscar Brock, PORZIO MR#:  [id number] DATE OF BIRTH:  16-Apr-1940  DATE OF CONSULTATION:  09/20/2011  REFERRING PHYSICIAN:  Dr. Jacques Navy  CONSULTING PHYSICIAN:  Lamar Blinks, MD  REASON FOR CONSULTATION: TIA.   CHIEF COMPLAINT: I had weakness.  HISTORY OF PRESENT ILLNESS: This is a 74 year old male with known cardiovascular disease, atrial fibrillation/flutter with hypertension who has had appropriate treatment with anticoagulation. The patient has had new onset of weakness in his left side which completely resolved over a one hour period consistent with a TIA. The patient had a CT scan showing no evidence of significant abnormalities and he feels much better. He has had appropriate heart rate control of atrial flutter and currently in normal sinus rhythm. The patient has had an EKG at this time showing sinus bradycardia with a QTc of 491. The patient has not had any other significant issues at this time. The patient remains with no evidence of further symptoms or TIA today.   Remainder of review of systems negative for vision change, ringing in the ears, hearing loss, cough, congestion, heartburn, nausea, vomiting, diarrhea, bloody stools, stomach pain, extremity pain, leg weakness, cramping of the buttocks, known blood clots, headaches, blackouts, dizzy spells, nosebleed, congestion, trouble swallowing, frequent urination, urination at night, muscle weakness, numbness, anxiety, depression, skin lesions, skin rashes.   PAST MEDICAL HISTORY:  1. TIA.  2. Atrial flutter.  3. Hypertension.  4. Valvular heart disease.   FAMILY HISTORY: No family members with early onset of cardiovascular disease or hypertension.   SOCIAL HISTORY: Currently denies alcohol or tobacco use.   ALLERGIES: He has no known drug allergies.   CURRENT MEDICATIONS: As listed.   PHYSICAL EXAMINATION:   VITAL SIGNS: Blood pressure 126/68 bilaterally, heart rate 72 upright, reclining, and regular.    GENERAL: He is a well appearing male in no acute distress.   HEENT: No icterus, thyromegaly, ulcers, hemorrhage, or xanthelasma.   CARDIOVASCULAR: Regular rate and rhythm. Normal S1, S2 without murmur, gallop, or rub. PMI is normal size and placement. Carotid upstroke normal without bruit. Jugular venous pressure normal.   LUNGS: Few basilar crackles with normal respirations.   ABDOMEN: Soft, nontender without hepatosplenomegaly or masses. Abdominal aorta is normal size without bruit.   EXTREMITIES: 2+ bilateral pulses in dorsal, pedal, radial, and femoral arteries with no lower extremity edema, cyanosis, clubbing, or ulcers.   NEUROLOGIC: He is oriented to time, place, and person with normal mood and affect.   ASSESSMENT: This is a 74 year old male with hypertension, hyperlipidemia, valvular heart disease, previous atrial fibrillation now in normal rhythm with a TIA completely resolved feeling quite well needing further treatment options.   RECOMMENDATIONS:  1. Continue anticoagulation with goal INR between 2 to 3 for further risk reduction in stroke with atrial fibrillation.  2. No treatment or changes in heart rate control and medication management for maintenance of normal sinus rhythm.  3. No intervention including echocardiogram with recent echocardiogram showing normal LV systolic function.  4. Ambulate and follow for improvement of symptoms.    5. Neurologic assessment and possible MRI for possible stroke and further treatment thereof as necessary including aspirin. 6. Further treatment options after above.    ____________________________ Lamar Blinks, MD bjk:drc D: 09/25/2011 08:44:41 ET T: 09/25/2011 11:08:58 ET JOB#: [id number]  cc: Lamar Blinks, MD, <Dictator> Lamar Blinks MD ELECTRONICALLY SIGNED 10/03/2011 16:07

## 2014-12-19 DIAGNOSIS — E032 Hypothyroidism due to medicaments and other exogenous substances: Secondary | ICD-10-CM | POA: Insufficient documentation

## 2016-05-22 ENCOUNTER — Emergency Department
Admission: EM | Admit: 2016-05-22 | Discharge: 2016-05-23 | Disposition: A | Discharge status: Home / Self Care | Payer: Medicare Other | Attending: Emergency Medicine | Admitting: Emergency Medicine

## 2016-05-22 ENCOUNTER — Emergency Department: Payer: Medicare Other

## 2016-05-22 DIAGNOSIS — I10 Essential (primary) hypertension: Secondary | ICD-10-CM | POA: Insufficient documentation

## 2016-05-22 DIAGNOSIS — R509 Fever, unspecified: Secondary | ICD-10-CM | POA: Diagnosis present

## 2016-05-22 DIAGNOSIS — J189 Pneumonia, unspecified organism: Secondary | ICD-10-CM | POA: Diagnosis not present

## 2016-05-22 DIAGNOSIS — J111 Influenza due to unidentified influenza virus with other respiratory manifestations: Secondary | ICD-10-CM | POA: Diagnosis not present

## 2016-05-22 DIAGNOSIS — J449 Chronic obstructive pulmonary disease, unspecified: Secondary | ICD-10-CM | POA: Diagnosis not present

## 2016-05-22 DIAGNOSIS — Z87891 Personal history of nicotine dependence: Secondary | ICD-10-CM | POA: Diagnosis not present

## 2016-05-22 DIAGNOSIS — E119 Type 2 diabetes mellitus without complications: Secondary | ICD-10-CM | POA: Insufficient documentation

## 2016-05-22 HISTORY — DX: Essential (primary) hypertension: I10

## 2016-05-22 HISTORY — DX: Type 2 diabetes mellitus without complications: E11.9

## 2016-05-22 HISTORY — DX: Unspecified osteoarthritis, unspecified site: M19.90

## 2016-05-22 HISTORY — DX: Chronic obstructive pulmonary disease, unspecified: J44.9

## 2016-05-22 HISTORY — DX: Unspecified atrial fibrillation: I48.91

## 2016-05-22 LAB — COMPREHENSIVE METABOLIC PANEL
ALT: 37 U/L (ref 17–63)
ANION GAP: 7 (ref 5–15)
AST: 45 U/L — ABNORMAL HIGH (ref 15–41)
Albumin: 3.6 g/dL (ref 3.5–5.0)
Alkaline Phosphatase: 49 U/L (ref 38–126)
BUN: 16 mg/dL (ref 6–20)
CHLORIDE: 101 mmol/L (ref 101–111)
CO2: 23 mmol/L (ref 22–32)
Calcium: 8 mg/dL — ABNORMAL LOW (ref 8.9–10.3)
Creatinine, Ser: 0.88 mg/dL (ref 0.61–1.24)
GFR calc non Af Amer: >60 mL/min (ref >60)
Glucose, Bld: 214 mg/dL — ABNORMAL HIGH (ref 65–99)
POTASSIUM: 4.1 mmol/L (ref 3.5–5.1)
SODIUM: 131 mmol/L — AB (ref 135–145)
Total Bilirubin: 0.6 mg/dL (ref 0.3–1.2)
Total Protein: 6.7 g/dL (ref 6.5–8.1)

## 2016-05-22 LAB — CBC WITH DIFFERENTIAL/PLATELET
Basophils Absolute: 0 10*3/uL (ref 0–0.1)
Basophils Relative: 0 %
EOS ABS: 0 10*3/uL (ref 0–0.7)
EOS PCT: 0 %
HCT: 36.1 % — ABNORMAL LOW (ref 40.0–52.0)
Hemoglobin: 12.7 g/dL — ABNORMAL LOW (ref 13.0–18.0)
LYMPHS ABS: 0.5 10*3/uL — AB (ref 1.0–3.6)
Lymphocytes Relative: 8 %
MCH: 32.5 pg (ref 26.0–34.0)
MCHC: 35 g/dL (ref 32.0–36.0)
MCV: 92.8 fL (ref 80.0–100.0)
MONO ABS: 0.7 10*3/uL (ref 0.2–1.0)
Monocytes Relative: 10 %
Neutro Abs: 5.5 10*3/uL (ref 1.4–6.5)
Neutrophils Relative %: 82 %
Platelets: 135 10*3/uL — ABNORMAL LOW (ref 150–440)
RBC: 3.89 MIL/uL — AB (ref 4.40–5.90)
RDW: 14.2 % (ref 11.5–14.5)
WBC: 6.7 10*3/uL (ref 3.8–10.6)

## 2016-05-22 LAB — URINALYSIS, COMPLETE (UACMP) WITH MICROSCOPIC
BILIRUBIN URINE: NEGATIVE
Bacteria, UA: NONE SEEN
Glucose, UA: 150 mg/dL — AB
HGB URINE DIPSTICK: NEGATIVE
Ketones, ur: NEGATIVE mg/dL
LEUKOCYTES UA: NEGATIVE
NITRITE: NEGATIVE
PROTEIN: 30 mg/dL — AB
Specific Gravity, Urine: 1.018 (ref 1.005–1.030)
Squamous Epithelial / LPF: NONE SEEN
pH: 8 (ref 5.0–8.0)

## 2016-05-22 LAB — TROPONIN I: TROPONIN I: 0.03 ng/mL — AB (ref <0.03)

## 2016-05-22 MED ORDER — SODIUM CHLORIDE 0.9 % IV SOLN
1000.0000 mL | Freq: Once | INTRAVENOUS | Status: AC
  Administered 2016-05-22: 1000 mL via INTRAVENOUS

## 2016-05-22 NOTE — ED Triage Notes (Signed)
Pt presents to ED via [facility] from home for flu-like symptoms. Pt states symptoms began Tuesday- fever, chills, sore throat, body aches. Pt states dx with flu at doctor on Friday, taken 4 days of tamiflu. Not feeling better. 102.6 fever at home, took tylneol at 9p.

## 2016-05-23 ENCOUNTER — Telehealth: Payer: Self-pay | Admitting: Emergency Medicine

## 2016-05-23 LAB — CK: Total CK: 76 U/L (ref 49–397)

## 2016-05-23 MED ORDER — TRAMADOL HCL 50 MG PO TABS
50.0000 mg | ORAL_TABLET | Freq: Once | ORAL | Status: AC
  Administered 2016-05-23: 50 mg via ORAL

## 2016-05-23 MED ORDER — AZITHROMYCIN 250 MG PO TABS: ORAL_TABLET | ORAL | 0 refills | Status: DC

## 2016-05-23 MED ORDER — TRAMADOL HCL 50 MG PO TABS
ORAL_TABLET | ORAL | Status: AC
  Administered 2016-05-23: 50 mg via ORAL
  Filled 2016-05-23: qty 1

## 2016-05-23 MED ORDER — AZITHROMYCIN 500 MG PO TABS
500.0000 mg | ORAL_TABLET | Freq: Once | ORAL | Status: AC
  Administered 2016-05-23: 500 mg via ORAL
  Filled 2016-05-23: qty 1

## 2016-05-23 NOTE — ED Provider Notes (Addendum)
[facility] Emergency Department Provider Note  ____________________________________________  Time seen: Approximately 1:43 AM  I have reviewed the triage vital signs and the nursing notes.   HISTORY  Chief Complaint Influenza    HPI Oscar Brock is a 76 y.o. male comes to the ED for evaluation due to persistent fever. He was recently diagnosed with influenza 3 days ago by his primary care doctor. His symptoms started 6 days ago. He was started on Tamiflu which she has been taking, but has had persistent fever, nonproductive cough, generalized abdominal discomfort. No chest pain or shortness of breath. No vomiting or diarrhea.     Past Medical History:  Diagnosis Date  . Arthritis   . Atrial fibrillation (HCC)   . COPD (chronic obstructive pulmonary disease) (HCC)   . Diabetes mellitus without complication (HCC)   . Hypertension      There are no active problems to display for this patient.    Past Surgical History:  Procedure Laterality Date  . APPENDECTOMY    . HERNIA REPAIR       Prior to Admission medications   Medication Sig Start Date End Date Taking? Authorizing Provider  azithromycin (ZITHROMAX Z-PAK) 250 MG tablet Take 2 tablets (500 mg) on  Day 1,  followed by 1 tablet (250 mg) once daily on Days 2 through 5. 05/23/16   Sharman Cheek, MD     Allergies Effexor xr [venlafaxine hcl er]; Flovent hfa [fluticasone]; Lisinopril; Propoxyphene; and Symbicort [budesonide-formoterol fumarate]   History reviewed. No pertinent family history.  Social History Social History  Substance Use Topics  . Smoking status: Former Games developer  . Smokeless tobacco: Not on file  . Alcohol use No    Review of Systems  Constitutional:   Positive fever and chills.  ENT:   Positive sore throat. No rhinorrhea. Cardiovascular:   No chest pain. Respiratory:   No dyspnea positive cough. Gastrointestinal:   Positive generalized abdominal pain without  vomiting and diarrhea.  Genitourinary:   Negative for dysuria or difficulty urinating. Musculoskeletal:   Negative for focal pain or swelling. Positive myalgia Neurological:   Negative for headaches 10-point ROS otherwise negative.  ____________________________________________   PHYSICAL EXAM:  VITAL SIGNS: ED Triage Vitals [05/22/16 2221]  Enc Vitals Group     BP (!) 145/67     Pulse Rate 66     Resp 16     Temp (!) 101.6 F (38.7 C)     Temp Source Oral     SpO2 92 %     Weight      Height      Head Circumference      Peak Flow      Pain Score 6     Pain Loc      Pain Edu?      Excl. in GC?     Vital signs reviewed, nursing assessments reviewed.   Constitutional:   Alert and oriented. Well appearing and in no distress. Eyes:   No scleral icterus. No conjunctival pallor. PERRL. EOMI.  No nystagmus. ENT   Head:   Normocephalic and atraumatic.   Nose:   No congestion/rhinnorhea. No septal hematoma   Mouth/Throat:   MMM, no pharyngeal erythema. No peritonsillar mass.    Neck:   No stridor. No SubQ emphysema. No meningismus. Hematological/Lymphatic/Immunilogical:   No cervical lymphadenopathy. Cardiovascular:   RRR. Symmetric bilateral radial and DP pulses.  No murmurs.  Respiratory:   Normal respiratory effort without tachypnea nor retractions.  Coarse expiratory breath sounds diffusely Gastrointestinal:   Soft without focal tenderness. Mildly distended. There is no CVA tenderness.  No rebound, rigidity, or guarding. Genitourinary:   deferred Musculoskeletal:   Normal range of motion in all extremities. No joint effusions.  No lower extremity tenderness.  No edema. Neurologic:   Normal speech and language.  CN 2-10 normal. Motor grossly intact. No gross focal neurologic deficits are appreciated.  Skin:    Skin is warm, dry and intact. No rash noted.  No petechiae, purpura, or bullae.  ____________________________________________    LABS (pertinent  positives/negatives) (all labs ordered are listed, but only abnormal results are displayed) Labs Reviewed  CBC WITH DIFFERENTIAL/PLATELET - Abnormal; Notable for the following:       Result Value   RBC 3.89 (*)    Hemoglobin 12.7 (*)    HCT 36.1 (*)    Platelets 135 (*)    Lymphs Abs 0.5 (*)    All other components within normal limits  COMPREHENSIVE METABOLIC PANEL - Abnormal; Notable for the following:    Sodium 131 (*)    Glucose, Bld 214 (*)    Calcium 8.0 (*)    AST 45 (*)    All other components within normal limits  TROPONIN I - Abnormal; Notable for the following:    Troponin I 0.03 (*)    All other components within normal limits  URINALYSIS, COMPLETE (UACMP) WITH MICROSCOPIC - Abnormal; Notable for the following:    Color, Urine YELLOW (*)    APPearance CLOUDY (*)    Glucose, UA 150 (*)    Protein, ur 30 (*)    All other components within normal limits  CK   ____________________________________________   EKG  Interpreted by me Sinus rhythm rate of 68, normal axis intervals QRS and ST segments. Isolated T-wave inversion in V3 which is nonspecific  ____________________________________________    RADIOLOGY  Dg Abdomen Acute W/chest  Result Date: 05/23/2016 CLINICAL DATA:  Fever, chills, sore throat, body aches. Diagnosed with flu on Friday and has had Tamiflu for 4 days. Not feeling better. Now with fever and abdominal distention. Productive cough. EXAM: DG ABDOMEN ACUTE W/ 1V CHEST COMPARISON:  Chest 09/19/2011 FINDINGS: Normal heart size and pulmonary vascularity. Nodular interstitial pattern to the lung bases may represent interstitial pneumonia or edema. No consolidation or volume loss. No blunting of costophrenic angles. No pneumothorax. Mediastinal contours appear intact. Calcification of the aorta. Cardiac pacemaker. Scattered gas and stool throughout the colon. No small or large bowel distention. No free intra- abdominal air. No abnormal air-fluid levels. No  radiopaque stones. Degenerative changes in the spine and hips. Postoperative changes consistent with inguinal hernia repairs. Surgical clips in the right lower quadrant likely represent appendectomy changes. IMPRESSION: Nodular interstitial infiltrative changes in the lung bases could indicate interstitial pneumonia or edema. Normal nonobstructive bowel gas pattern. Electronically Signed   By: Burman Nieves M.D.   On: 05/23/2016 00:44    ____________________________________________   PROCEDURES Procedures  ____________________________________________   INITIAL IMPRESSION / ASSESSMENT AND PLAN / ED COURSE  Pertinent labs & imaging results that were available during my care of the patient were reviewed by me and considered in my medical decision making (see chart for details).  Patient well appearing no acute distress. Presents for reassessment of flulike symptoms that have been going on for about a week. Has been following up with his doctor. CK normal, x-ray shows possible atypical infiltrate but no clear consolidation. Vital signs otherwise unremarkable. Start  the patient on azithromycin especially due to his underlying lung disease, follow-up with primary care. Return precautions given. Presentation is not consistent with sepsis.Considering the patient's symptoms, medical history, and physical examination today, I have low suspicion for cholecystitis or biliary pathology, pancreatitis, perforation or bowel obstruction, hernia, intra-abdominal abscess, AAA or dissection, volvulus or intussusception, mesenteric ischemia, or appendicitis.  No evidence of soft tissue infection meningitis or encephalitis. Engaged and shared decision making with patient and family at bedside, patient agreeable to outpatient management and close follow-up with primary care   Initial troponin was sent due to triage protocols. It's essentially negative. His symptoms are completely inconsistent with ACS PE  dissection or carditis. Does not require further cardiac workup at this time      ____________________________________________   FINAL CLINICAL IMPRESSION(S) / ED DIAGNOSES  Final diagnoses:  Influenza  Atypical pneumonia      New Prescriptions   AZITHROMYCIN (ZITHROMAX Z-PAK) 250 MG TABLET    Take 2 tablets (500 mg) on  Day 1,  followed by 1 tablet (250 mg) once daily on Days 2 through 5.     Portions of this note were generated with dragon dictation software. Dictation errors may occur despite best attempts at proofreading.    Sharman Cheek, MD 05/23/16 0148    Sharman Cheek, MD 05/23/16 [phone]

## 2016-05-23 NOTE — ED Notes (Signed)
Pt states his pain is increasing to a 8 and is requesting tramadol 50mg  which he reports is his home medication.

## 2016-05-23 NOTE — Telephone Encounter (Signed)
Wife called to clarify azithromycin.  Patient had received 500mg  while in the ED and then was given a z pack rx.  I explained that he should take 1 a day for 4 days and then stop.

## 2016-07-12 DIAGNOSIS — R251 Tremor, unspecified: Secondary | ICD-10-CM | POA: Insufficient documentation

## 2016-07-12 DIAGNOSIS — R2681 Unsteadiness on feet: Secondary | ICD-10-CM | POA: Insufficient documentation

## 2016-07-12 HISTORY — DX: Tremor, unspecified: R25.1

## 2016-09-23 DIAGNOSIS — R262 Difficulty in walking, not elsewhere classified: Secondary | ICD-10-CM | POA: Insufficient documentation

## 2016-10-10 DIAGNOSIS — K5904 Chronic idiopathic constipation: Secondary | ICD-10-CM | POA: Insufficient documentation

## 2016-10-10 DIAGNOSIS — E1142 Type 2 diabetes mellitus with diabetic polyneuropathy: Secondary | ICD-10-CM | POA: Insufficient documentation

## 2017-02-13 DIAGNOSIS — I63239 Cerebral infarction due to unspecified occlusion or stenosis of unspecified carotid arteries: Secondary | ICD-10-CM | POA: Insufficient documentation

## 2017-02-13 HISTORY — DX: Cerebral infarction due to unspecified occlusion or stenosis of unspecified carotid artery: I63.239

## 2017-04-10 ENCOUNTER — Encounter: Payer: Self-pay | Admitting: Emergency Medicine

## 2017-04-10 ENCOUNTER — Observation Stay
Admission: EM | Admit: 2017-04-10 | Discharge: 2017-04-13 | Disposition: A | Discharge status: Skilled Nursing Facility | Payer: Medicare Other | Attending: Internal Medicine | Admitting: Internal Medicine

## 2017-04-10 ENCOUNTER — Other Ambulatory Visit: Payer: Self-pay

## 2017-04-10 DIAGNOSIS — F320 Major depressive disorder, single episode, mild: Secondary | ICD-10-CM | POA: Insufficient documentation

## 2017-04-10 DIAGNOSIS — I4891 Unspecified atrial fibrillation: Secondary | ICD-10-CM | POA: Insufficient documentation

## 2017-04-10 DIAGNOSIS — Z888 Allergy status to other drugs, medicaments and biological substances status: Secondary | ICD-10-CM | POA: Insufficient documentation

## 2017-04-10 DIAGNOSIS — G8929 Other chronic pain: Secondary | ICD-10-CM | POA: Insufficient documentation

## 2017-04-10 DIAGNOSIS — E162 Hypoglycemia, unspecified: Secondary | ICD-10-CM | POA: Diagnosis present

## 2017-04-10 DIAGNOSIS — Z79899 Other long term (current) drug therapy: Secondary | ICD-10-CM | POA: Insufficient documentation

## 2017-04-10 DIAGNOSIS — Z7901 Long term (current) use of anticoagulants: Secondary | ICD-10-CM | POA: Diagnosis not present

## 2017-04-10 DIAGNOSIS — E11649 Type 2 diabetes mellitus with hypoglycemia without coma: Secondary | ICD-10-CM | POA: Diagnosis not present

## 2017-04-10 DIAGNOSIS — Z950 Presence of cardiac pacemaker: Secondary | ICD-10-CM | POA: Insufficient documentation

## 2017-04-10 DIAGNOSIS — I495 Sick sinus syndrome: Secondary | ICD-10-CM | POA: Diagnosis present

## 2017-04-10 DIAGNOSIS — I69354 Hemiplegia and hemiparesis following cerebral infarction affecting left non-dominant side: Secondary | ICD-10-CM | POA: Insufficient documentation

## 2017-04-10 DIAGNOSIS — F321 Major depressive disorder, single episode, moderate: Secondary | ICD-10-CM

## 2017-04-10 DIAGNOSIS — I10 Essential (primary) hypertension: Secondary | ICD-10-CM | POA: Diagnosis not present

## 2017-04-10 DIAGNOSIS — Z87891 Personal history of nicotine dependence: Secondary | ICD-10-CM | POA: Insufficient documentation

## 2017-04-10 DIAGNOSIS — E119 Type 2 diabetes mellitus without complications: Secondary | ICD-10-CM

## 2017-04-10 DIAGNOSIS — Z8673 Personal history of transient ischemic attack (TIA), and cerebral infarction without residual deficits: Secondary | ICD-10-CM

## 2017-04-10 DIAGNOSIS — J449 Chronic obstructive pulmonary disease, unspecified: Secondary | ICD-10-CM | POA: Diagnosis present

## 2017-04-10 HISTORY — DX: Cerebral infarction, unspecified: I63.9

## 2017-04-10 LAB — URINALYSIS, COMPLETE (UACMP) WITH MICROSCOPIC
Bacteria, UA: NONE SEEN
Bilirubin Urine: NEGATIVE
GLUCOSE, UA: 50 mg/dL — AB
Hgb urine dipstick: NEGATIVE
KETONES UR: NEGATIVE mg/dL
Leukocytes, UA: NEGATIVE
NITRITE: NEGATIVE
PH: 6 (ref 5.0–8.0)
Protein, ur: NEGATIVE mg/dL
SPECIFIC GRAVITY, URINE: 1.018 (ref 1.005–1.030)
Squamous Epithelial / LPF: NONE SEEN

## 2017-04-10 LAB — CBC WITH DIFFERENTIAL/PLATELET
BASOS ABS: 0.1 10*3/uL (ref 0–0.1)
Basophils Relative: 0 %
Eosinophils Absolute: 0.1 10*3/uL (ref 0–0.7)
Eosinophils Relative: 1 %
HEMATOCRIT: 37.9 % — AB (ref 40.0–52.0)
HEMOGLOBIN: 12.5 g/dL — AB (ref 13.0–18.0)
Lymphocytes Relative: 14 %
Lymphs Abs: 2 10*3/uL (ref 1.0–3.6)
MCH: 31.2 pg (ref 26.0–34.0)
MCHC: 33 g/dL (ref 32.0–36.0)
MCV: 94.7 fL (ref 80.0–100.0)
Monocytes Absolute: 1.6 10*3/uL — ABNORMAL HIGH (ref 0.2–1.0)
Monocytes Relative: 12 %
NEUTROS ABS: 10 10*3/uL — AB (ref 1.4–6.5)
NEUTROS PCT: 73 %
Platelets: 277 10*3/uL (ref 150–440)
RBC: 4.01 MIL/uL — ABNORMAL LOW (ref 4.40–5.90)
RDW: 15.4 % — ABNORMAL HIGH (ref 11.5–14.5)
WBC: 13.7 10*3/uL — AB (ref 3.8–10.6)

## 2017-04-10 LAB — BASIC METABOLIC PANEL
ANION GAP: 7 (ref 5–15)
BUN: 15 mg/dL (ref 6–20)
CALCIUM: 8.7 mg/dL — AB (ref 8.9–10.3)
CHLORIDE: 103 mmol/L (ref 101–111)
CO2: 27 mmol/L (ref 22–32)
Creatinine, Ser: 0.77 mg/dL (ref 0.61–1.24)
GFR calc non Af Amer: >60 mL/min (ref >60)
Glucose, Bld: 103 mg/dL — ABNORMAL HIGH (ref 65–99)
Potassium: 3.5 mmol/L (ref 3.5–5.1)
Sodium: 137 mmol/L (ref 135–145)

## 2017-04-10 LAB — GLUCOSE, CAPILLARY
Glucose-Capillary: 140 mg/dL — ABNORMAL HIGH (ref 65–99)
Glucose-Capillary: 34 mg/dL — CL (ref 65–99)
Glucose-Capillary: 67 mg/dL (ref 65–99)
Glucose-Capillary: 78 mg/dL (ref 65–99)
Glucose-Capillary: 86 mg/dL (ref 65–99)
Glucose-Capillary: 57 mg/dL — ABNORMAL LOW (ref 65–99)

## 2017-04-10 LAB — TROPONIN I: Troponin I: 0.03 ng/mL (ref <0.03)

## 2017-04-10 MED ORDER — DEXTROSE 50 % IV SOLN
INTRAVENOUS | Status: AC
  Administered 2017-04-10: 50 mL via INTRAVENOUS
  Filled 2017-04-10: qty 50

## 2017-04-10 MED ORDER — DEXTROSE 50 % IV SOLN
1.0000 | Freq: Once | INTRAVENOUS | Status: AC
  Administered 2017-04-10: 50 mL via INTRAVENOUS

## 2017-04-10 MED ORDER — DEXTROSE 5 % IV SOLN
Freq: Once | INTRAVENOUS | Status: AC
  Administered 2017-04-10: 20:00:00 via INTRAVENOUS

## 2017-04-10 MED ORDER — DEXTROSE 50 % IV SOLN
INTRAVENOUS | Status: AC
  Filled 2017-04-10: qty 50

## 2017-04-10 NOTE — ED Triage Notes (Signed)
Pt presents to ED via [facility] with c/o hypoglycemia. Pt has hx of diabetes however up until recent stroke pt was not an insulin dependent diabetic. EMS reports pt CBG 255 earlier today, was given 6 units of Novolin, pt's recheck 49, given 2 cups of cranberry juice, recheck CBG 70, upon EMS arrival CBG 99. While this RN received report from EMS pt became notedly diaphoretic and flushed, initial CBG 39, recheck 34. MD notified and 1amp D50 given, recheck CBG 140.

## 2017-04-10 NOTE — ED Notes (Signed)
Family and pt notified of plan to recheck BS at 2250 and if blood sugar is stable we will d/c. If blood sugar falls again, he will need to be admitted.

## 2017-04-10 NOTE — ED Provider Notes (Signed)
[facility] Emergency Department Provider Note  ____________________________________________   I have reviewed the triage vital signs and the nursing notes.   HISTORY  Chief Complaint Low blood sugar  History limited by: Not Limited   HPI Oscar Brock is a 77 y.o. male who presents to the emergency department today because of concern for low blood sugar. 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 The patient came home from [facility] yesterday. Apparently he has diabetes but had previously been managing it by diet and exercise until the CVA when they put him on insulin. Family states that they have been having a hard time managing the sugars after coming home. Last night they got low into the forties and he was given juice which brought it up into the eighties. It then got low again today. He has only had 6 units of insulin today. He denies any fevers, chest pain or shortness of breath. Has had sweating when his blood sugars go low.     Per medical record review patient has a history of DM, CVA  Past Medical History:  Diagnosis Date  . Arthritis   . Atrial fibrillation (HCC)   . COPD (chronic obstructive pulmonary disease) (HCC)   . Diabetes mellitus without complication (HCC)   . Hypertension     There are no active problems to display for this patient.   Past Surgical History:  Procedure Laterality Date  . APPENDECTOMY    . HERNIA REPAIR      Prior to Admission medications   Medication Sig Start Date End Date Taking? Authorizing Provider  azithromycin (ZITHROMAX Z-PAK) 250 MG tablet Take 2 tablets (500 mg) on  Day 1,  followed by 1 tablet (250 mg) once daily on Days 2 through 5. 05/23/16   Sharman Cheek, MD    Allergies Effexor xr [venlafaxine hcl er]; Flovent hfa [fluticasone]; Lisinopril; Propoxyphene; and Symbicort [budesonide-formoterol fumarate]  No family history on file.  Social History Social History   Tobacco  Use  . Smoking status: Former Smoker  Substance Use Topics  . Alcohol use: No  . Drug use: Not on file    Review of Systems Constitutional: No fever/chills Eyes: No visual changes. ENT: No sore throat. Cardiovascular: Denies chest pain. Respiratory: Denies shortness of breath. Gastrointestinal: No abdominal pain.  No nausea, no vomiting.  No diarrhea.   Genitourinary: Negative for dysuria. Musculoskeletal: Negative for back pain. Skin: Negative for rash. Neurological: Negative for headaches, focal weakness or numbness.  ____________________________________________   PHYSICAL EXAM:  VITAL SIGNS: ED Triage Vitals [04/10/17 1813]  Enc Vitals Group     BP 131/62     Pulse Rate 68     Resp 16     Temp (!) 97.5 F (36.4 C)     Temp Source Oral     SpO2 95 %     Weight 204 lb (92.5 kg)     Height 6\' 3"  (1.905 m)   Constitutional: Alert and oriented. Diaphoretic. Eyes: Conjunctivae are normal.  ENT   Head: Normocephalic and atraumatic.   Nose: No congestion/rhinnorhea.   Mouth/Throat: Mucous membranes are moist.   Neck: No stridor. Hematological/Lymphatic/Immunilogical: No cervical lymphadenopathy. Cardiovascular: Normal rate, regular rhythm.  No murmurs, rubs, or gallops.  Respiratory: Normal respiratory effort without tachypnea nor retractions. Breath sounds are clear and equal bilaterally. No wheezes/rales/rhonchi. Gastrointestinal: Soft and non tender. No rebound. No guarding.  Genitourinary: Deferred Musculoskeletal: Normal range of motion in all extremities. No lower  extremity edema. Neurologic: Left sided weakness.  Skin:  Skin is diaphoretic. Psychiatric: Mood and affect are normal. Speech and behavior are normal. Patient exhibits appropriate insight and judgment.  ____________________________________________    LABS (pertinent positives/negatives)  UA not consistent with infection Trop 0.03 CBC wbc 13.7, hgb 12.5, plt 277 BMP na 137, k 3.5,  glu 103, 0.77  ____________________________________________   EKG  None  ____________________________________________    RADIOLOGY  None  ____________________________________________   PROCEDURES  Procedures  ____________________________________________   INITIAL IMPRESSION / ASSESSMENT AND PLAN / ED COURSE  Pertinent labs & imaging results that were available during my care of the patient were reviewed by me and considered in my medical decision making (see chart for details).  Patient presented to the emergency department today because of concerns for low blood sugars.  Patient recently arrived home for [facility] where he was after stroke.  Sounds like he was recently started on insulin having previously manage his diabetes with diet and exercise.  Concern for possible infection or medication causing the hypoglycemia.  Patient not with any respiratory complaints.  Urine without signs of infection.  Patient was started on a D5 drip and was allowed to eat and drink.  We did trial the patient after D5 after did stabilized however it then decreased back down to 57.  Given continued appearances of hypoglycemia will plan on admission to the hospital service.  Discussed this with patient and family.    ____________________________________________   FINAL CLINICAL IMPRESSION(S) / ED DIAGNOSES  Final diagnoses:  Hypoglycemia     Note: This dictation was prepared with Dragon dictation. Any transcriptional errors that result from this process are unintentional     Phineas Semen, MD 04/11/17 0004

## 2017-04-10 NOTE — ED Notes (Signed)
Date and time results received: 04/10/17 1915 (use smartphrase ".now" to insert current time)  Test: Troponin Critical Value: 0.03  Name of Provider Notified: Derrill Kay  Orders Received? Or Actions Taken?: acknowledged

## 2017-04-10 NOTE — ED Notes (Signed)
Blood sugar is 86.

## 2017-04-10 NOTE — ED Notes (Signed)
Family at bedside. 

## 2017-04-11 ENCOUNTER — Encounter: Payer: Self-pay | Admitting: Internal Medicine

## 2017-04-11 ENCOUNTER — Other Ambulatory Visit: Payer: Self-pay

## 2017-04-11 DIAGNOSIS — E119 Type 2 diabetes mellitus without complications: Secondary | ICD-10-CM

## 2017-04-11 DIAGNOSIS — F331 Major depressive disorder, recurrent, moderate: Secondary | ICD-10-CM | POA: Insufficient documentation

## 2017-04-11 DIAGNOSIS — Z8673 Personal history of transient ischemic attack (TIA), and cerebral infarction without residual deficits: Secondary | ICD-10-CM

## 2017-04-11 DIAGNOSIS — E162 Hypoglycemia, unspecified: Secondary | ICD-10-CM | POA: Diagnosis present

## 2017-04-11 DIAGNOSIS — F321 Major depressive disorder, single episode, moderate: Secondary | ICD-10-CM

## 2017-04-11 DIAGNOSIS — J449 Chronic obstructive pulmonary disease, unspecified: Secondary | ICD-10-CM | POA: Diagnosis present

## 2017-04-11 DIAGNOSIS — I10 Essential (primary) hypertension: Secondary | ICD-10-CM | POA: Diagnosis present

## 2017-04-11 DIAGNOSIS — F320 Major depressive disorder, single episode, mild: Secondary | ICD-10-CM

## 2017-04-11 DIAGNOSIS — I495 Sick sinus syndrome: Secondary | ICD-10-CM | POA: Diagnosis present

## 2017-04-11 HISTORY — DX: Essential (primary) hypertension: I10

## 2017-04-11 LAB — BASIC METABOLIC PANEL
Anion gap: 9 (ref 5–15)
BUN: 11 mg/dL (ref 6–20)
CALCIUM: 8.8 mg/dL — AB (ref 8.9–10.3)
CO2: 24 mmol/L (ref 22–32)
CREATININE: 0.63 mg/dL (ref 0.61–1.24)
Chloride: 101 mmol/L (ref 101–111)
GFR calc non Af Amer: >60 mL/min (ref >60)
GLUCOSE: 169 mg/dL — AB (ref 65–99)
Potassium: 4.1 mmol/L (ref 3.5–5.1)
Sodium: 134 mmol/L — ABNORMAL LOW (ref 135–145)

## 2017-04-11 LAB — GLUCOSE, CAPILLARY
GLUCOSE-CAPILLARY: 165 mg/dL — AB (ref 65–99)
GLUCOSE-CAPILLARY: 152 mg/dL — AB (ref 65–99)
GLUCOSE-CAPILLARY: 153 mg/dL — AB (ref 65–99)
GLUCOSE-CAPILLARY: 164 mg/dL — AB (ref 65–99)
GLUCOSE-CAPILLARY: 232 mg/dL — AB (ref 65–99)
GLUCOSE-CAPILLARY: 156 mg/dL — AB (ref 65–99)
Glucose-Capillary: 38 mg/dL — CL (ref 65–99)
Glucose-Capillary: 93 mg/dL (ref 65–99)
Glucose-Capillary: 206 mg/dL — ABNORMAL HIGH (ref 65–99)
Glucose-Capillary: 208 mg/dL — ABNORMAL HIGH (ref 65–99)

## 2017-04-11 LAB — CBC
HCT: 37.2 % — ABNORMAL LOW (ref 40.0–52.0)
Hemoglobin: 12.5 g/dL — ABNORMAL LOW (ref 13.0–18.0)
MCH: 31.2 pg (ref 26.0–34.0)
MCHC: 33.5 g/dL (ref 32.0–36.0)
MCV: 93.3 fL (ref 80.0–100.0)
PLATELETS: 235 10*3/uL (ref 150–440)
RBC: 3.99 MIL/uL — AB (ref 4.40–5.90)
RDW: 15.5 % — ABNORMAL HIGH (ref 11.5–14.5)
WBC: 12 10*3/uL — ABNORMAL HIGH (ref 3.8–10.6)

## 2017-04-11 MED ORDER — LOSARTAN POTASSIUM 25 MG PO TABS
25.0000 mg | ORAL_TABLET | Freq: Every day | ORAL | Status: DC
  Administered 2017-04-11 – 2017-04-13 (×3): 25 mg via ORAL
  Filled 2017-04-11 (×3): qty 1

## 2017-04-11 MED ORDER — GABAPENTIN 100 MG PO CAPS
200.0000 mg | ORAL_CAPSULE | Freq: Three times a day (TID) | ORAL | Status: DC
  Administered 2017-04-11 – 2017-04-13 (×8): 200 mg via ORAL
  Filled 2017-04-11 (×8): qty 2

## 2017-04-11 MED ORDER — TRAMADOL HCL 50 MG PO TABS: 50.0000 mg | ORAL_TABLET | Freq: Four times a day (QID) | ORAL | Status: DC | PRN

## 2017-04-11 MED ORDER — ALUM & MAG HYDROXIDE-SIMETH 200-200-20 MG/5ML PO SUSP
30.0000 mL | Freq: Four times a day (QID) | ORAL | Status: DC | PRN
  Administered 2017-04-11 – 2017-04-12 (×2): 30 mL via ORAL
  Filled 2017-04-11 (×2): qty 30

## 2017-04-11 MED ORDER — DIPHENHYDRAMINE HCL 25 MG PO CAPS
25.0000 mg | ORAL_CAPSULE | Freq: Every evening | ORAL | Status: DC | PRN
  Administered 2017-04-11 (×2): 25 mg via ORAL
  Filled 2017-04-11: qty 1

## 2017-04-11 MED ORDER — ACETAMINOPHEN 325 MG PO TABS
650.0000 mg | ORAL_TABLET | Freq: Four times a day (QID) | ORAL | Status: DC | PRN
  Administered 2017-04-12 – 2017-04-13 (×3): 650 mg via ORAL
  Filled 2017-04-11 (×3): qty 2

## 2017-04-11 MED ORDER — ALUM & MAG HYDROXIDE-SIMETH 200-200-20 MG/5ML PO SUSP
30.0000 mL | Freq: Once | ORAL | Status: AC
  Administered 2017-04-11: 30 mL via ORAL

## 2017-04-11 MED ORDER — LEVOTHYROXINE SODIUM 100 MCG PO TABS
100.0000 ug | ORAL_TABLET | Freq: Every day | ORAL | Status: DC
  Administered 2017-04-11 – 2017-04-12 (×2): 100 ug
  Filled 2017-04-11 (×2): qty 1

## 2017-04-11 MED ORDER — MELATONIN 5 MG PO TABS
5.0000 mg | ORAL_TABLET | Freq: Every evening | ORAL | Status: DC | PRN
  Administered 2017-04-11 – 2017-04-12 (×2): 5 mg via ORAL
  Filled 2017-04-11 (×3): qty 1

## 2017-04-11 MED ORDER — DEXTROSE-NACL 5-0.45 % IV SOLN
INTRAVENOUS | Status: AC
  Administered 2017-04-11: 06:00:00 via INTRAVENOUS

## 2017-04-11 MED ORDER — ONDANSETRON HCL 4 MG PO TABS
4.0000 mg | ORAL_TABLET | Freq: Four times a day (QID) | ORAL | Status: DC | PRN
Start: 2017-04-11 — End: 2017-04-13

## 2017-04-11 MED ORDER — APIXABAN 5 MG PO TABS
5.0000 mg | ORAL_TABLET | Freq: Two times a day (BID) | ORAL | Status: DC
  Administered 2017-04-11 – 2017-04-13 (×5): 5 mg via ORAL
  Filled 2017-04-11 (×5): qty 1

## 2017-04-11 MED ORDER — METHOTREXATE SODIUM (PF) CHEMO INJECTION 200 MG/8ML: 20.0000 mg | Freq: Once | INTRAMUSCULAR | Status: DC

## 2017-04-11 MED ORDER — ONDANSETRON HCL 4 MG/2ML IJ SOLN
4.0000 mg | Freq: Four times a day (QID) | INTRAMUSCULAR | Status: DC | PRN
Start: 2017-04-11 — End: 2017-04-13

## 2017-04-11 MED ORDER — INSULIN ASPART 100 UNIT/ML ~~LOC~~ SOLN
0.0000 [IU] | Freq: Three times a day (TID) | SUBCUTANEOUS | Status: DC
  Administered 2017-04-11 – 2017-04-12 (×2): 2 [IU] via SUBCUTANEOUS
  Administered 2017-04-12 – 2017-04-13 (×2): 1 [IU] via SUBCUTANEOUS
  Filled 2017-04-11 (×4): qty 1

## 2017-04-11 MED ORDER — DIPHENHYDRAMINE HCL 25 MG PO CAPS
ORAL_CAPSULE | ORAL | Status: AC
  Administered 2017-04-11: 25 mg via ORAL
  Filled 2017-04-11: qty 1

## 2017-04-11 MED ORDER — DOCUSATE SODIUM 100 MG PO CAPS
100.0000 mg | ORAL_CAPSULE | Freq: Two times a day (BID) | ORAL | Status: DC
  Administered 2017-04-11 – 2017-04-13 (×4): 100 mg via ORAL
  Filled 2017-04-11 (×4): qty 1

## 2017-04-11 MED ORDER — FOLIC ACID 1 MG PO TABS
1.0000 mg | ORAL_TABLET | Freq: Every day | ORAL | Status: DC
  Administered 2017-04-11 – 2017-04-13 (×3): 1 mg via ORAL
  Filled 2017-04-11 (×3): qty 1

## 2017-04-11 MED ORDER — FAMOTIDINE 20 MG PO TABS
20.0000 mg | ORAL_TABLET | Freq: Every day | ORAL | Status: DC
  Administered 2017-04-11 – 2017-04-12 (×2): 20 mg
  Filled 2017-04-11 (×2): qty 1

## 2017-04-11 MED ORDER — DULOXETINE HCL 20 MG PO CPEP
20.0000 mg | ORAL_CAPSULE | Freq: Two times a day (BID) | ORAL | Status: DC
  Administered 2017-04-11 – 2017-04-13 (×5): 20 mg via ORAL
  Filled 2017-04-11 (×6): qty 1

## 2017-04-11 MED ORDER — FINASTERIDE 5 MG PO TABS
5.0000 mg | ORAL_TABLET | Freq: Every day | ORAL | Status: DC
  Administered 2017-04-11 – 2017-04-13 (×3): 5 mg via ORAL
  Filled 2017-04-11 (×3): qty 1

## 2017-04-11 MED ORDER — INSULIN ASPART 100 UNIT/ML ~~LOC~~ SOLN: 0.0000 [IU] | Freq: Three times a day (TID) | SUBCUTANEOUS | Status: DC

## 2017-04-11 MED ORDER — AMIODARONE HCL 200 MG PO TABS
200.0000 mg | ORAL_TABLET | Freq: Every day | ORAL | Status: DC
  Administered 2017-04-11 – 2017-04-13 (×3): 200 mg via ORAL
  Filled 2017-04-11 (×3): qty 1

## 2017-04-11 MED ORDER — ATORVASTATIN CALCIUM 20 MG PO TABS
40.0000 mg | ORAL_TABLET | Freq: Every day | ORAL | Status: DC
  Administered 2017-04-11: 40 mg
  Filled 2017-04-11: qty 2

## 2017-04-11 MED ORDER — ACETAMINOPHEN 650 MG RE SUPP: 650.0000 mg | Freq: Four times a day (QID) | RECTAL | Status: DC | PRN

## 2017-04-11 MED ORDER — OXYCODONE HCL 5 MG PO TABS
5.0000 mg | ORAL_TABLET | ORAL | Status: DC | PRN
  Administered 2017-04-11 – 2017-04-13 (×3): 5 mg via ORAL
  Filled 2017-04-11 (×5): qty 1

## 2017-04-11 MED ORDER — ALUM & MAG HYDROXIDE-SIMETH 200-200-20 MG/5ML PO SUSP
ORAL | Status: AC
  Filled 2017-04-11: qty 30

## 2017-04-11 NOTE — Consult Note (Signed)
[facility] Consult   Reason for Consult: Consult for 77 year old man currently in the hospital because of an episode of hypoglycemia.  Concern about depression Referring Physician:  Gouru Patient Identification: Oscar Brock MRN:  [id number] Principal Diagnosis: Mild major depression, single episode [facility]) Diagnosis:   Patient Active Problem List   Diagnosis Date Noted  . HTN (hypertension) [I10] 04/11/2017  . Diabetes ([place]) [E11.9] 04/11/2017  . History of stroke [Z86.73] 04/11/2017  . COPD (chronic obstructive pulmonary disease) ([place]) [J44.9] 04/11/2017  . Atrial fibrillation ([place]) [I48.91] 04/11/2017  . Hypoglycemia [E16.2] 04/11/2017  . Mild major depression, single episode ([place]) [F32.0] 04/11/2017    Total Time spent with patient: 1 hour  Subjective:   Oscar Brock is a 77 y.o. male patient admitted with "I guess I have been overwhelmed".  HPI: Patient interviewed chart reviewed.  Patient's daughter was also present.  66 year old man currently in the hospital for an episode of hypoglycemia causing altered mental status but prior to that he had been in rehab for several weeks after a stroke that he suffered near the end of November.  Patient says that his medical problems are beginning to feel overwhelming to him.  He feels irritable and his mood is down.  He feels he loses his temper more easily.  Sleep is not very good.  Appetite is okay although he is having some problems with gastric reflux symptoms.  He is troubled by chronic pain which has been worse since his stroke.  He has some occasional feelings of hopelessness but denies any suicidal or homicidal thoughts.  Not having any psychotic symptoms.  Patient is not currently receiving any psychiatric treatment.  He is able to identify multiple positive things in his life.  Social history: Patient [place] his [place] farm.  Has multiple members of his extended family around.  Seems to have a good relationship  with all of them.  The stroke has caused him to have a lot of loss of function.  Medical history: History of diabetes hypertension and now status post a right-sided stroke causing significant change in his function.  Substance abuse history: No alcohol or drug abuse  Past Psychiatric History: No past psychiatric history.  Never seen a mental health provider.  No history of psychiatric medicine or hospitalization.  No history of suicide attempts.  Risk to Self: Is patient at risk for suicide?: No Risk to Others:   Prior Inpatient Therapy:   Prior Outpatient Therapy:    Past Medical History:  Past Medical History:  Diagnosis Date  . Arthritis   . Atrial fibrillation ([place])   . COPD (chronic obstructive pulmonary disease) ([place])   . Diabetes mellitus without complication ([place])   . Hypertension   . Stroke [facility])     Past Surgical History:  Procedure Laterality Date  . APPENDECTOMY    . HERNIA REPAIR    . PACEMAKER INSERTION    . PEG TUBE PLACEMENT     Family History:  Family History  Problem Relation Age of Onset  . Heart failure Mother   . Clotting disorder Mother   . Heart failure Father   . Hypertension Father   . Diabetes Father   . Stroke Brother   . Lung cancer Sister    Family Psychiatric  History: Patient had a brother with some anxiety problems Social History:  Social History   Substance and Sexual Activity  Alcohol Use No     Social History  Substance and Sexual Activity  Drug Use No    Social History   Socioeconomic History  . Marital status: Married    Spouse name: None  . Number of children: None  . Years of education: None  . Highest education level: None  Social Needs  . Financial resource strain: Not hard at all  . Food insecurity - worry: Patient refused  . Food insecurity - inability: Patient refused  . Transportation needs - medical: Patient refused  . Transportation needs - non-medical: Patient refused  Occupational History  . None   Tobacco Use  . Smoking status: Former Smoker    Last attempt to quit: 1990    Years since quitting: 29.0  . Smokeless tobacco: Never Used  Substance and Sexual Activity  . Alcohol use: No  . Drug use: No  . Sexual activity: Not Currently  Other Topics Concern  . None  Social History Narrative  . None   Additional Social History:    Allergies:   Allergies  Allergen Reactions  . Effexor Xr [Venlafaxine Hcl Er]   . Flovent Hfa [Fluticasone]   . Lisinopril   . Propoxyphene   . Symbicort [Budesonide-Formoterol Fumarate]     Labs:  Results for orders placed or performed during the hospital encounter of 04/10/17 (from the past 48 hour(s))  Glucose, capillary     Status: Abnormal   Collection Time: 04/10/17  5:57 PM  Result Value Ref Range   Glucose-Capillary 38 (LL) 65 - 99 mg/dL    Comment: REPEATED TO VERIFY, CHARGE CREDITED Performed at [facility], [address] [place], [place] [address]    Comment 1 Repeat Test   Glucose, capillary     Status: Abnormal   Collection Time: 04/10/17  6:00 PM  Result Value Ref Range   Glucose-Capillary 34 (LL) 65 - 99 mg/dL   Comment 1 Call MD NNP PA CNM   CBC with Differential     Status: Abnormal   Collection Time: 04/10/17  6:16 PM  Result Value Ref Range   WBC 13.7 (H) 3.8 - 10.6 K/uL   RBC 4.01 (L) 4.40 - 5.90 MIL/uL   Hemoglobin 12.5 (L) 13.0 - 18.0 g/dL   HCT 37.9 (L) 40.0 - 52.0 %   MCV 94.7 80.0 - 100.0 fL   MCH 31.2 26.0 - 34.0 pg   MCHC 33.0 32.0 - 36.0 g/dL   RDW 15.4 (H) 11.5 - 14.5 %   Platelets 277 150 - 440 K/uL   Neutrophils Relative % 73 %   Neutro Abs 10.0 (H) 1.4 - 6.5 K/uL   Lymphocytes Relative 14 %   Lymphs Abs 2.0 1.0 - 3.6 K/uL   Monocytes Relative 12 %   Monocytes Absolute 1.6 (H) 0.2 - 1.0 K/uL   Eosinophils Relative 1 %   Eosinophils Absolute 0.1 0 - 0.7 K/uL   Basophils Relative 0 %   Basophils Absolute 0.1 0 - 0.1 K/uL    Comment: Performed at [facility], [address], [place],  [address]  Basic metabolic panel     Status: Abnormal   Collection Time: 04/10/17  6:16 PM  Result Value Ref Range   Sodium 137 135 - 145 mmol/L   Potassium 3.5 3.5 - 5.1 mmol/L   Chloride 103 101 - 111 mmol/L   CO2 27 22 - 32 mmol/L   Glucose, Bld 103 (H) 65 - 99 mg/dL   BUN 15 6 - 20 mg/dL   Creatinine,  Ser 0.77 0.61 - 1.24 mg/dL   Calcium 8.7 (L) 8.9 - 10.3 mg/dL   GFR calc non Af Amer >60 >60 mL/min   GFR calc Af Amer >60 >60 mL/min    Comment: (NOTE) The eGFR has been calculated using the CKD EPI equation. This calculation has not been validated in all clinical situations. eGFR's persistently <60 mL/min signify possible Chronic Kidney Disease.    Anion gap 7 5 - 15    Comment: Performed at [facility], [address] [place], [place] [address]  Troponin I     Status: Abnormal   Collection Time: 04/10/17  6:16 PM  Result Value Ref Range   Troponin I 0.03 (HH) <0.03 ng/mL    Comment: CRITICAL RESULT CALLED TO, READ BACK BY AND VERIFIED WITH MEGAN JONES @ 1916 ON 04/10/2017 BY CAF Performed at [facility], [address] [place], [place] [address]   Glucose, capillary     Status: Abnormal   Collection Time: 04/10/17  6:17 PM  Result Value Ref Range   Glucose-Capillary 140 (H) 65 - 99 mg/dL  Glucose, capillary     Status: None   Collection Time: 04/10/17  6:58 PM  Result Value Ref Range   Glucose-Capillary 67 65 - 99 mg/dL  Glucose, capillary     Status: None   Collection Time: 04/10/17  8:01 PM  Result Value Ref Range   Glucose-Capillary 78 65 - 99 mg/dL  Urinalysis, Complete w Microscopic     Status: Abnormal   Collection Time: 04/10/17  8:40 PM  Result Value Ref Range   Color, Urine YELLOW (A) YELLOW   APPearance HAZY (A) CLEAR   Specific Gravity, Urine 1.018 1.005 - 1.030   pH 6.0 5.0 - 8.0   Glucose, UA 50 (A) NEGATIVE mg/dL   Hgb urine dipstick NEGATIVE NEGATIVE   Bilirubin Urine NEGATIVE NEGATIVE   Ketones, ur NEGATIVE  NEGATIVE mg/dL   Protein, ur NEGATIVE NEGATIVE mg/dL   Nitrite NEGATIVE NEGATIVE   Leukocytes, UA NEGATIVE NEGATIVE   RBC / HPF 0-5 0 - 5 RBC/hpf   WBC, UA 0-5 0 - 5 WBC/hpf   Bacteria, UA NONE SEEN NONE SEEN   Squamous Epithelial / LPF NONE SEEN NONE SEEN   Mucus PRESENT    Amorphous Crystal PRESENT     Comment: Performed at [facility], [address] [place], [place] [address]  Glucose, capillary     Status: None   Collection Time: 04/10/17  9:25 PM  Result Value Ref Range   Glucose-Capillary 86 65 - 99 mg/dL  Glucose, capillary     Status: Abnormal   Collection Time: 04/10/17 10:55 PM  Result Value Ref Range   Glucose-Capillary 57 (L) 65 - 99 mg/dL  Glucose, capillary     Status: None   Collection Time: 04/11/17  1:13 AM  Result Value Ref Range   Glucose-Capillary 93 65 - 99 mg/dL  Glucose, capillary     Status: Abnormal   Collection Time: 04/11/17  2:29 AM  Result Value Ref Range   Glucose-Capillary 165 (H) 65 - 99 mg/dL  Glucose, capillary     Status: Abnormal   Collection Time: 04/11/17  3:29 AM  Result Value Ref Range   Glucose-Capillary 152 (H) 65 - 99 mg/dL  Glucose, capillary     Status: Abnormal   Collection Time: 04/11/17  5:06 AM  Result Value Ref Range   Glucose-Capillary 153 (H) 65 - 99 mg/dL  Basic metabolic panel  Status: Abnormal   Collection Time: 04/11/17  7:12 AM  Result Value Ref Range   Sodium 134 (L) 135 - 145 mmol/L   Potassium 4.1 3.5 - 5.1 mmol/L   Chloride 101 101 - 111 mmol/L   CO2 24 22 - 32 mmol/L   Glucose, Bld 169 (H) 65 - 99 mg/dL   BUN 11 6 - 20 mg/dL   Creatinine, Ser 0.63 0.61 - 1.24 mg/dL   Calcium 8.8 (L) 8.9 - 10.3 mg/dL   GFR calc non Af Amer >60 >60 mL/min   GFR calc Af Amer >60 >60 mL/min    Comment: (NOTE) The eGFR has been calculated using the CKD EPI equation. This calculation has not been validated in all clinical situations. eGFR's persistently <60 mL/min signify possible Chronic Kidney Disease.     Anion gap 9 5 - 15    Comment: Performed at [facility], [address] [place], [place] [address]  CBC     Status: Abnormal   Collection Time: 04/11/17  7:12 AM  Result Value Ref Range   WBC 12.0 (H) 3.8 - 10.6 K/uL   RBC 3.99 (L) 4.40 - 5.90 MIL/uL   Hemoglobin 12.5 (L) 13.0 - 18.0 g/dL   HCT 37.2 (L) 40.0 - 52.0 %   MCV 93.3 80.0 - 100.0 fL   MCH 31.2 26.0 - 34.0 pg   MCHC 33.5 32.0 - 36.0 g/dL   RDW 15.5 (H) 11.5 - 14.5 %   Platelets 235 150 - 440 K/uL    Comment: Performed at [facility], [address] [place], [place] [address]  Glucose, capillary     Status: Abnormal   Collection Time: 04/11/17  7:30 AM  Result Value Ref Range   Glucose-Capillary 164 (H) 65 - 99 mg/dL  Glucose, capillary     Status: Abnormal   Collection Time: 04/11/17 11:43 AM  Result Value Ref Range   Glucose-Capillary 206 (H) 65 - 99 mg/dL    Current Facility-Administered Medications  Medication Dose Route Frequency Provider Last Rate Last Dose  . acetaminophen (TYLENOL) tablet 650 mg  650 mg Oral Q6H PRN Lance Coon, MD       Or  . acetaminophen (TYLENOL) suppository 650 mg  650 mg Rectal Q6H PRN Lance Coon, MD      . amiodarone (PACERONE) tablet 200 mg  200 mg Oral Daily Lance Coon, MD   200 mg at 04/11/17 0819  . apixaban (ELIQUIS) tablet 5 mg  5 mg Oral BID Lance Coon, MD   5 mg at 04/11/17 0819  . atorvastatin (LIPITOR) tablet 40 mg  40 mg Per Tube Daily Lance Coon, MD      . DULoxetine (CYMBALTA) DR capsule 20 mg  20 mg Oral BID Cleo Villamizar T, MD      . famotidine (PEPCID) tablet 20 mg  20 mg Per Tube Daily Lance Coon, MD   20 mg at 04/11/17 0819  . finasteride (PROSCAR) tablet 5 mg  5 mg Oral Daily Lance Coon, MD   5 mg at 04/11/17 0819  . folic acid (FOLVITE) tablet 1 mg  1 mg Oral Daily Gouru, Aruna, MD      . gabapentin (NEURONTIN) capsule 200 mg  200 mg Oral TID Lance Coon, MD   200 mg at 04/11/17 0819  . insulin aspart (novoLOG) injection  0-5 Units  0-5 Units Subcutaneous TID WC Gouru, Aruna, MD      . levothyroxine (SYNTHROID, LEVOTHROID) tablet 100 mcg  100 mcg Per Tube QAC breakfast Lance Coon, MD   100 mcg at 04/11/17 0819  . losartan (COZAAR) tablet 25 mg  25 mg Oral Daily Lance Coon, MD   25 mg at 04/11/17 0819  . Melatonin TABS 5 mg  5 mg Oral QHS PRN Gouru, Aruna, MD      . ondansetron (ZOFRAN) tablet 4 mg  4 mg Oral Q6H PRN Lance Coon, MD       Or  . ondansetron [facility]) injection 4 mg  4 mg Intravenous Q6H PRN Lance Coon, MD      . oxyCODONE (Oxy IR/ROXICODONE) immediate release tablet 5 mg  5 mg Oral Q4H PRN Lance Coon, MD   5 mg at 04/11/17 0535  . traMADol (ULTRAM) tablet 50 mg  50 mg Oral Q6H PRN Nicholes Mango, MD        Musculoskeletal: Strength & Muscle Tone: decreased Gait & Station: unable to stand Patient leans: Left  Psychiatric Specialty Exam: Physical Exam  Nursing note and vitals reviewed. Constitutional: He appears well-developed and well-nourished.  HENT:  Head: Normocephalic and atraumatic.  Eyes: Conjunctivae are normal. Pupils are equal, round, and reactive to light.  Neck: Normal range of motion.  Cardiovascular: Regular rhythm and normal heart sounds.  Respiratory: Effort normal. No respiratory distress.  GI: Soft.  Musculoskeletal: Normal range of motion.  Neurological: He is alert. He exhibits abnormal muscle tone.  Patient's left arm is not functional.  Did not do full testing but appears to have lost pretty much all function in his left upper extremity at this point.  Has some lack of tone on the left side of the face.  Skin: Skin is warm and dry.  Psychiatric: Judgment normal. His mood appears anxious. His speech is delayed. He is slowed. Thought content is not paranoid. Cognition and memory are normal. He expresses no homicidal and no suicidal ideation.    Review of Systems  Constitutional: Negative.   HENT: Negative.   Eyes: Negative.   Respiratory: Negative.    Cardiovascular: Negative.   Gastrointestinal: Negative.   Musculoskeletal: Positive for joint pain and myalgias.  Skin: Negative.   Neurological: Negative.   Psychiatric/Behavioral: Positive for depression. Negative for hallucinations, memory loss, substance abuse and suicidal ideas. The patient is nervous/anxious and has insomnia.     Blood pressure (!) 120/58, pulse (!) 59, temperature 98.9 F (37.2 C), temperature source Oral, resp. rate 20, height 6' 3"  (1.905 m), weight 94.3 kg (208 lb), SpO2 96 %.Body mass index is 26 kg/m.  General Appearance: Casual  Eye Contact:  Good  Speech:  Slow  Volume:  Decreased  Mood:  Dysphoric  Affect:  Congruent  Thought Process:  Goal Directed  Orientation:  Full (Time, Place, and Person)  Thought Content:  Logical  Suicidal Thoughts:  No  Homicidal Thoughts:  No  Memory:  Immediate;   Fair Recent;   Fair Remote;   Fair  Judgement:  Good  Insight:  Good  Psychomotor Activity:  Decreased  Concentration:  Concentration: Fair  Recall:  [place] of Knowledge:  Good  Language:  Good  Akathisia:  No  Handed:  Right  AIMS (if indicated):     Assets:  Communication Skills Desire for Improvement Financial Resources/Insurance Housing Resilience Social Support  ADL's:  Impaired  Cognition:  Impaired,  Mild  Sleep:        Treatment Plan Summary: Daily contact with patient to assess and evaluate symptoms and progress in treatment, Medication  management and Plan 77 year old man status post stroke now with multiple other medical problems.  Patient is insightful and conversant but is describing multiple symptoms consistent with depression.  No sign however of dangerousness.  Not suicidal.  Not psychotic.  Patient appears to have probably a mild to moderate degree of major depression complicating his stroke.  Spent some time doing some psychoeducation.  Discussed treatment options.  He also has chronic pain from his rheumatoid arthritis probably  made worse by the stroke.  Patient would probably benefit from starting an antidepressant.  I recommended starting with a trial of duloxetine to assist also with pain.  Side effects reviewed.  Start 20 mg twice a day.  Case reviewed with hospitalist.  We will follow up as needed.  Disposition: No evidence of imminent risk to self or others at present.   Patient does not meet criteria for psychiatric inpatient admission. Supportive therapy provided about ongoing stressors.  Alethia Berthold, MD 04/11/2017 3:50 PM

## 2017-04-11 NOTE — Progress Notes (Signed)
Inpatient Diabetes Program Recommendations  AACE/ADA: New Consensus Statement on Inpatient Glycemic Control (2015)  Target Ranges:  Prepandial:   less than 140 mg/dL      Peak postprandial:   less than 180 mg/dL (1-2 hours)      Critically ill patients:  140 - 180 mg/dL   Lab Results  Component Value Date   GLUCAP 206 (H) 04/11/2017   HGBA1C 7.5 (H) 09/20/2011    Review of Glycemic Control  Results for RIDER, ERMIS (MRN [id number]) as of 04/11/2017 14:39  Ref. Range 04/11/2017 02:29 04/11/2017 03:29 04/11/2017 05:06 04/11/2017 07:30 04/11/2017 11:43  Glucose-Capillary Latest Ref Range: 65 - 99 mg/dL 466 (H) 599 (H) 357 (H) 164 (H) 206 (H)    Diabetes history: DM2 Outpatient Diabetes medications: NPH 0-6 units TID Current orders for Inpatient glycemic control: CBG monitoring  Inpatient Diabetes Program Recommendations: Patient's son spoke to RN about his concern with sliding scale insulin.  Son was not available to discuss NPH vs. Novolog.  Patient is currently getting D5W 0.45% normal saline.  The patient is eating.     If the patient and his son are refusing current Novolog sliding scale, consider using -Custom Novolog correction scale 0-5 units tid and hs      151-200  1 unit      201-250  2 units      251-300  3 units      301-350  4 units      351-400  5 units  Patient will likely need Novolog 0-9 units tomorrow. Text paged Dr. Amado Coe.   Susette Racer, RN, BA, MHA, CDE Diabetes Coordinator Inpatient Diabetes Program  [phone] (Team Pager) [phone] [facility] Office) 04/11/2017 2:43 PM

## 2017-04-11 NOTE — ED Notes (Signed)
Patient is resting comfortably. 

## 2017-04-11 NOTE — Progress Notes (Signed)
Inpatient Diabetes Program Recommendations  AACE/ADA: New Consensus Statement on Inpatient Glycemic Control (2015)  Target Ranges:  Prepandial:   less than 140 mg/dL      Peak postprandial:   less than 180 mg/dL (1-2 hours)      Critically ill patients:  140 - 180 mg/dL   Results for Oscar Brock, Oscar Brock (MRN [id number]) as of 04/11/2017 12:58  Ref. Range 04/10/2017 18:58 04/10/2017 20:01 04/10/2017 21:25 04/10/2017 22:55 04/11/2017 01:13 04/11/2017 02:29 04/11/2017 03:29 04/11/2017 05:06 04/11/2017 07:30 04/11/2017 11:43  Glucose-Capillary Latest Ref Range: 65 - 99 mg/dL 67 78 86 57 (L) 93 829 (H) 152 (H) 153 (H) 164 (H) 206 (H)   Review of Glycemic Control  Diabetes history: DM2 Outpatient Diabetes medications: NPH 0-6 units TID Current orders for Inpatient glycemic control: CBG monitoring  Inpatient Diabetes Program Recommendations: Correction (SSI): Patient was initially hypoglycemic which was has now been resolved. Please consider ordering CBGs with Novolog 0-9 units TID with meals and Novolog 0-5 units QHS. Outpatient DM medications: Per home medication list, patient is receiving NPH three times a day. Likely needs outpatient DM medication regimen adjusted.   Thanks, Orlando Penner, RN, MSN, CDE Diabetes Coordinator Inpatient Diabetes Program [phone] (Team Pager from 8am to 5pm)

## 2017-04-11 NOTE — ED Notes (Signed)
Per Dr Anne Hahn, pt given sandwich tray, and gingerale

## 2017-04-11 NOTE — Progress Notes (Signed)
[facility] [place] [facility]   PATIENT NAME: Oscar Brock    MR#:  [id number]  DATE OF BIRTH:  Oct 16, 1940  SUBJECTIVE:  CHIEF COMPLAINT: Patient is eating well, feeling depressed. H/o recent CVAs x3, has left upper and lower extremity weakness as residual deficit.  Patient needs assistance and wheelchair bound.  Wife at bedside  REVIEW OF SYSTEMS:  CONSTITUTIONAL: No fever, fatigue or weakness.  EYES: No blurred or double vision.  EARS, NOSE, AND THROAT: No tinnitus or ear pain.  RESPIRATORY: No cough, shortness of breath, wheezing or hemoptysis.  CARDIOVASCULAR: No chest pain, orthopnea, edema.  GASTROINTESTINAL: No nausea, vomiting, diarrhea or abdominal pain.  GENITOURINARY: No dysuria, hematuria.  ENDOCRINE: No polyuria, nocturia,  HEMATOLOGY: No anemia, easy bruising or bleeding SKIN: No rash or lesion. MUSCULOSKELETAL: No joint pain or arthritis.   NEUROLOGIC: No tingling, numbness, weakness.  PSYCHIATRY: Reports depression.   DRUG ALLERGIES:   Allergies  Allergen Reactions  . Effexor Xr [Venlafaxine Hcl Er]   . Flovent Hfa [Fluticasone]   . Lisinopril   . Propoxyphene   . Symbicort [Budesonide-Formoterol Fumarate]     VITALS:  Blood pressure (!) 120/58, pulse (!) 59, temperature 98.9 F (37.2 C), temperature source Oral, resp. rate 20, height 6\' 3"  (1.905 m), weight 94.3 kg (208 lb), SpO2 96 %.  PHYSICAL EXAMINATION:  GENERAL:  77 y.o.-year-old patient lying in the bed with no acute distress.  EYES: Pupils equal, round, reactive to light and accommodation. No scleral icterus. Extraocular muscles intact.  HEENT: Head atraumatic, normocephalic. Oropharynx and nasopharynx clear.  NECK:  Supple, no jugular venous distention. No thyroid enlargement, no tenderness.  LUNGS: Normal breath sounds bilaterally, no wheezing, rales,rhonchi or crepitation. No use of accessory muscles of respiration.  CARDIOVASCULAR: S1, S2 normal. No murmurs,  rubs, or gallops.  ABDOMEN: Soft, nontender, nondistended. Bowel sounds present. No organomegaly or mass.  EXTREMITIES: No pedal edema, cyanosis, or clubbing.  NEUROLOGIC: Cranial nerves II through XII are intact.  Chronic left upper and lower extremity weakness, sensation intact. Gait not checked.  PSYCHIATRIC: The patient is alert and oriented x 3.  SKIN: No obvious rash, lesion, or ulcer.    LABORATORY PANEL:   CBC Recent Labs  Lab 04/11/17 0712  WBC 12.0*  HGB 12.5*  HCT 37.2*  PLT 235   ------------------------------------------------------------------------------------------------------------------  Chemistries  Recent Labs  Lab 04/11/17 0712  NA 134*  K 4.1  CL 101  CO2 24  GLUCOSE 169*  BUN 11  CREATININE 0.63  CALCIUM 8.8*   ------------------------------------------------------------------------------------------------------------------  Cardiac Enzymes Recent Labs  Lab 04/10/17 1816  TROPONINI 0.03*   ------------------------------------------------------------------------------------------------------------------  RADIOLOGY:  No results found.  EKG:   Orders placed or performed during the hospital encounter of 05/22/16  . ED EKG  . ED EKG  . EKG 12-Lead  . EKG 12-Lead  . EKG    ASSESSMENT AND PLAN:     Hypoglycemia - continue D5 infusion for now, wean off as tolerated  frequent glucose checks until his blood sugar starts to rise at which point the D5 can be weaned off   History of stroke x3 with left-sided hemiplegia PT consulted  Depression-patient seen by psychiatry started on Cymbalta 20 mg twice daily Appreciate psych recommendations Patient denies any suicidal or homicidal ideation    Diabetes (HCC) -avoid anti-glycemic's for now, sliding scale insulin    HTN (hypertension) -continue home meds Cozaar    COPD (chronic obstructive pulmonary disease) (HCC) - inhalers  Atrial fibrillation (HCC) -rate controlled.  Continue  Eliquis, amiodarone   PT consult placed-patient is wheelchair-bound at his baseline with left-sided hemiplegia    All the records are reviewed and case discussed with Care Management/Social Workerr. Management plans discussed with the patient, wife and they are in agreement.  CODE STATUS:  fc  TOTAL TIME TAKING CARE OF THIS PATIENT:  36  minutes.   POSSIBLE D/C IN  1-2DAYS, DEPENDING ON CLINICAL CONDITION.  Note: This dictation was prepared with Dragon dictation along with smaller phrase technology. Any transcriptional errors that result from this process are unintentional.   [name] M.D on 04/11/2017 at 3:36 PM  Between 7am to 6pm - Pager - [phone] After 6pm go to [email or web address] - password [email or web address] [facility]  [facility] [place] [facility]  Office  [phone]  CC: Primary care physician; Mickey Farber, MD

## 2017-04-11 NOTE — Progress Notes (Signed)
Family Meeting Note  Advance Directive:yes  Today a meeting took place with the Patient, wife at bedside    The following clinical team members were present during this meeting:MD  The following were discussed:Patient's diagnosis: Hypoglycemia, recurrent strokes with left-sided hemiplegia, depression and diabetes mellitus patient's progosis: > 12 months and Goals for treatment: Full Code, wife is the healthcare power of attorney  Additional follow-up to be provided: Hospitalist and psychiatrist  Time spent during discussion:18 min  [name], MD

## 2017-04-11 NOTE — H&P (Signed)
[facility] -  at [facility]   PATIENT NAME: Oscar Brock    MR#:  [id number]  DATE OF BIRTH:  [birth year]  DATE OF ADMISSION:  04/10/2017  PRIMARY CARE PHYSICIAN: Mickey Farber, MD   REQUESTING/REFERRING PHYSICIAN: Derrill Kay, MD  CHIEF COMPLAINT:   Chief Complaint  Patient presents with  . Hypoglycemia    HISTORY OF PRESENT ILLNESS:  Oscar Brock  is a 77 y.o. male who presents with cyst and hypoglycemia.  Patient recently had a stroke and was in the nursing facility for rehab where he was found to have significant hypoglycemia even despite minimal anti-glycemic medication.  Here in the ED he continues to have low blood sugars requiring dextrose infusion.  Hospitalist were called for admission  PAST MEDICAL HISTORY:   Past Medical History:  Diagnosis Date  . Arthritis   . Atrial fibrillation (HCC)   . COPD (chronic obstructive pulmonary disease) (HCC)   . Diabetes mellitus without complication (HCC)   . Hypertension   . Stroke [facility])     PAST SURGICAL HISTORY:   Past Surgical History:  Procedure Laterality Date  . APPENDECTOMY    . HERNIA REPAIR    . PACEMAKER INSERTION    . PEG TUBE PLACEMENT      SOCIAL HISTORY:   Social History   Tobacco Use  . Smoking status: Former Games developer  . Smokeless tobacco: Never Used  Substance Use Topics  . Alcohol use: No    FAMILY HISTORY:   Family History  Problem Relation Age of Onset  . Heart failure Mother   . Clotting disorder Mother   . Heart failure Father   . Hypertension Father   . Diabetes Father   . Stroke Brother   . Lung cancer Sister     DRUG ALLERGIES:   Allergies  Allergen Reactions  . Effexor Xr [Venlafaxine Hcl Er]   . Flovent Hfa [Fluticasone]   . Lisinopril   . Propoxyphene   . Symbicort [Budesonide-Formoterol Fumarate]     MEDICATIONS AT HOME:   Prior to Admission medications   Medication Sig Start Date End Date Taking? Authorizing Provider  acetaminophen  (TYLENOL) 500 MG tablet Take 500 mg by mouth every 6 (six) hours as needed.   Yes [provider]  amiodarone (PACERONE) 200 MG tablet Take 200 mg by mouth daily.   Yes [provider]  apixaban (ELIQUIS) 5 MG TABS tablet Take 5 mg by mouth 2 (two) times daily.   Yes [provider]  atorvastatin (LIPITOR) 40 MG tablet Place 1 tablet into feeding tube daily.  01/25/17  Yes [provider]  famotidine (PEPCID) 20 MG tablet Place 20 mg into feeding tube daily.   Yes [provider]  finasteride (PROSCAR) 5 MG tablet Take 1 tablet by mouth daily. 01/25/17  Yes [provider]  folic acid (FOLVITE) 1 MG tablet Take 1 mg by mouth daily.   Yes [provider]  gabapentin (NEURONTIN) 100 MG capsule Take 200 mg by mouth 3 (three) times daily. 01/26/17  Yes [provider]  insulin NPH Human (HUMULIN N,NOVOLIN N) 100 UNIT/ML injection Inject 0-6 Units into the skin 3 (three) times daily.  03/01/17  Yes [provider]  ipratropium (ATROVENT) 0.02 % nebulizer solution Take 0.5 mg by nebulization 4 (four) times daily.   Yes [provider]  levothyroxine (SYNTHROID, LEVOTHROID) 100 MCG tablet Place 1 tablet into feeding tube daily.  01/25/17  Yes [provider]  losartan (  COZAAR) 25 MG tablet Take 25 mg by mouth daily.   Yes [provider]  Melatonin 3 MG CAPS Take 1 capsule by mouth at bedtime.   Yes [provider]  Methotrexate Sodium (METHOTREXATE, PF,) 200 MG/8ML injection Inject 20 mg into the skin once a week. 04/11/16  Yes [provider]  oxyCODONE (OXY IR/ROXICODONE) 5 MG immediate release tablet Take 5 mg by mouth every 4 (four) hours as needed for severe pain.   Yes [provider]  azithromycin (ZITHROMAX Z-PAK) 250 MG tablet Take 2 tablets (500 mg) on  Day 1,  followed by 1 tablet (250 mg) once daily on Days 2 through 5. Patient not taking: Reported on 04/10/2017  05/23/16   Sharman Cheek, MD    REVIEW OF SYSTEMS:  Review of Systems  Constitutional: Negative for chills, fever, malaise/fatigue and weight loss.  HENT: Negative for ear pain, hearing loss and tinnitus.   Eyes: Negative for blurred vision, double vision, pain and redness.  Respiratory: Negative for cough, hemoptysis and shortness of breath.   Cardiovascular: Negative for chest pain, palpitations, orthopnea and leg swelling.  Gastrointestinal: Negative for abdominal pain, constipation, diarrhea, nausea and vomiting.  Genitourinary: Negative for dysuria, frequency and hematuria.  Musculoskeletal: Negative for back pain, joint pain and neck pain.  Skin:       No acne, rash, or lesions  Neurological: Negative for dizziness, tremors, focal weakness and weakness.  Endo/Heme/Allergies: Negative for polydipsia. Does not bruise/bleed easily.  Psychiatric/Behavioral: Negative for depression. The patient is not nervous/anxious and does not have insomnia.      VITAL SIGNS:   Vitals:   04/10/17 2130 04/10/17 2200 04/10/17 2300 04/10/17 2330  BP: (!) 141/72 133/68 139/65 (!) 143/68  Pulse: 66 66 66 64  Resp: 15 13 15 16   Temp:      TempSrc:      SpO2: 97% 95% 96% 96%  Weight:      Height:       Wt Readings from Last 3 Encounters:  04/10/17 92.5 kg (204 lb)    PHYSICAL EXAMINATION:  Physical Exam  Vitals reviewed. Constitutional: He is oriented to person, place, and time. He appears well-developed and well-nourished. No distress.  HENT:  Head: Normocephalic and atraumatic.  Mouth/Throat: Oropharynx is clear and moist.  Eyes: Conjunctivae and EOM are normal. Pupils are equal, round, and reactive to light. No scleral icterus.  Neck: Normal range of motion. Neck supple. No JVD present. No thyromegaly present.  Cardiovascular: Normal rate, regular rhythm and intact distal pulses. Exam reveals no gallop and no friction rub.  No murmur heard. Respiratory: Effort normal and breath  sounds normal. No respiratory distress. He has no wheezes. He has no rales.  GI: Soft. Bowel sounds are normal. He exhibits no distension. There is no tenderness.  Musculoskeletal: Normal range of motion. He exhibits no edema.  No arthritis, no gout  Lymphadenopathy:    He has no cervical adenopathy.  Neurological: He is alert and oriented to person, place, and time. No cranial nerve deficit.  No dysarthria, no aphasia  Skin: Skin is warm and dry. No rash noted. No erythema.  Psychiatric: He has a normal mood and affect. His behavior is normal. Judgment and thought content normal.    LABORATORY PANEL:   CBC Recent Labs  Lab 04/10/17 1816  WBC 13.7*  HGB 12.5*  HCT 37.9*  PLT 277   ------------------------------------------------------------------------------------------------------------------  Chemistries  Recent Labs  Lab 04/10/17 1816  NA 137  K 3.5  CL 103  CO2 27  GLUCOSE 103*  BUN 15  CREATININE 0.77  CALCIUM 8.7*   ------------------------------------------------------------------------------------------------------------------  Cardiac Enzymes Recent Labs  Lab 04/10/17 1816  TROPONINI 0.03*   ------------------------------------------------------------------------------------------------------------------  RADIOLOGY:  No results found.  EKG:   Orders placed or performed during the hospital encounter of 05/22/16  . ED EKG  . ED EKG  . EKG 12-Lead  . EKG 12-Lead  . EKG    IMPRESSION AND PLAN:  Principal Problem:   Hypoglycemia -continue D5 infusion for now, frequent glucose checks until his blood sugar starts to rise at which point the D5 can be weaned off along as he stays stable Active Problems:   Diabetes (HCC) -avoid anti-glycemic's for now, other treatment as above   HTN (hypertension) -continue home meds   COPD (chronic obstructive pulmonary disease) (HCC) -home dose inhalers   Atrial fibrillation (HCC) -home rate controlling  medications  All the records are reviewed and case discussed with ED provider. Management plans discussed with the patient and/or family.  DVT PROPHYLAXIS: Systemic anticoagulation  GI PROPHYLAXIS: PPI  ADMISSION STATUS: Observation  CODE STATUS: Full Code Status History    This patient does not have a recorded code status. Please follow your organizational policy for patients in this situation.      TOTAL TIME TAKING CARE OF THIS PATIENT: 40 minutes.   Luv Mish FIELDING 04/11/2017, 12:33 AM  [facility]  Office  [phone]  CC: Primary care physician; Mickey Farber, MD  Note:  This document was prepared using Dragon voice recognition software and may include unintentional dictation errors.

## 2017-04-11 NOTE — Care Management Obs Status (Signed)
MEDICARE OBSERVATION STATUS NOTIFICATION   Patient Details  Name: Oscar Brock MRN: [id number] Date of Birth: 03-16-1940   Medicare Observation Status Notification Given:  Yes    Gwenette Greet, RN 04/11/2017, 1:20 PM

## 2017-04-12 DIAGNOSIS — F320 Major depressive disorder, single episode, mild: Secondary | ICD-10-CM | POA: Diagnosis not present

## 2017-04-12 LAB — CBC
HCT: 36.8 % — ABNORMAL LOW (ref 40.0–52.0)
HEMOGLOBIN: 12.6 g/dL — AB (ref 13.0–18.0)
MCH: 31.9 pg (ref 26.0–34.0)
MCHC: 34.3 g/dL (ref 32.0–36.0)
MCV: 93 fL (ref 80.0–100.0)
Platelets: 206 10*3/uL (ref 150–440)
RBC: 3.96 MIL/uL — AB (ref 4.40–5.90)
RDW: 15.2 % — ABNORMAL HIGH (ref 11.5–14.5)
WBC: 10 10*3/uL (ref 3.8–10.6)

## 2017-04-12 LAB — GLUCOSE, CAPILLARY
GLUCOSE-CAPILLARY: 141 mg/dL — AB (ref 65–99)
GLUCOSE-CAPILLARY: 145 mg/dL — AB (ref 65–99)
GLUCOSE-CAPILLARY: 151 mg/dL — AB (ref 65–99)
GLUCOSE-CAPILLARY: 213 mg/dL — AB (ref 65–99)
Glucose-Capillary: 228 mg/dL — ABNORMAL HIGH (ref 65–99)

## 2017-04-12 MED ORDER — LEVOTHYROXINE SODIUM 100 MCG PO TABS
100.0000 ug | ORAL_TABLET | Freq: Every day | ORAL | Status: DC
  Administered 2017-04-13: 08:00:00 100 ug via ORAL
  Filled 2017-04-12: qty 1

## 2017-04-12 MED ORDER — ATORVASTATIN CALCIUM 20 MG PO TABS
40.0000 mg | ORAL_TABLET | Freq: Every day | ORAL | Status: DC
  Administered 2017-04-12: 18:00:00 40 mg via ORAL
  Filled 2017-04-12: qty 2

## 2017-04-12 MED ORDER — FAMOTIDINE 20 MG PO TABS
20.0000 mg | ORAL_TABLET | Freq: Every day | ORAL | Status: DC
  Administered 2017-04-13: 10:00:00 20 mg via ORAL
  Filled 2017-04-12: qty 1

## 2017-04-12 NOTE — NC FL2 (Signed)
[place] MEDICAID FL2 LEVEL OF CARE SCREENING TOOL     IDENTIFICATION  Patient Name: Oscar Brock Birthdate: 09-04-1940 Sex: male Admission Date (Current Location): 04/10/2017  [place] and [place] Number:  Chiropodist and Address:  [facility], [address] [place], [place] [address]      Provider Number: [id number]  Attending Physician Name and Address:  [name], MD  Relative Name and Phone Number:       Current Level of Care: Hospital Recommended Level of Care: Skilled Nursing Facility Prior Approval Number:    Date Approved/Denied:   PASRR Number:   [id number]  Discharge Plan: SNF    Current Diagnoses: Patient Active Problem List   Diagnosis Date Noted  . HTN (hypertension) 04/11/2017  . Diabetes (HCC) 04/11/2017  . History of stroke 04/11/2017  . COPD (chronic obstructive pulmonary disease) (HCC) 04/11/2017  . Atrial fibrillation (HCC) 04/11/2017  . Hypoglycemia 04/11/2017  . Mild major depression, single episode (HCC) 04/11/2017    Orientation RESPIRATION BLADDER Height & Weight     Self, Place  Normal Incontinent Weight: 208 lb (94.3 kg) Height:  6\' 3"  (190.5 cm)  BEHAVIORAL SYMPTOMS/MOOD NEUROLOGICAL BOWEL NUTRITION STATUS      Incontinent Diet  AMBULATORY STATUS COMMUNICATION OF NEEDS Skin   Extensive Assist Verbally Normal                       Personal Care Assistance Level of Assistance  Bathing, Feeding, Dressing Bathing Assistance: Limited assistance Feeding assistance: Limited assistance Dressing Assistance: Limited assistance     Functional Limitations Info  Sight, Hearing, Speech Sight Info: Adequate Hearing Info: Adequate Speech Info: Adequate    SPECIAL CARE FACTORS FREQUENCY  PT (By licensed PT), OT (By licensed OT)     PT Frequency: 5x OT Frequency: 5x            Contractures Contractures Info: Not present    Additional Factors Info  Code Status, Allergies Code  Status Info: Full Code Allergies Info: Effexor Xr Venlafaxine Hcl Er, Flovent Hfa Fluticasone, Lisinopril, Propoxyphene, Symbicort Budesonide-formoterol Fumarate           Current Medications (04/12/2017):  This is the current hospital active medication list Current Facility-Administered Medications  Medication Dose Route Frequency Provider Last Rate Last Dose  . acetaminophen (TYLENOL) tablet 650 mg  650 mg Oral Q6H PRN 04/14/2017, MD   650 mg at 04/12/17 0556   Or  . acetaminophen (TYLENOL) suppository 650 mg  650 mg Rectal Q6H PRN 04/14/17, MD      . alum & mag hydroxide-simeth (MAALOX/MYLANTA) 200-200-20 MG/5ML suspension 30 mL  30 mL Oral Q6H PRN 09-20-2000, MD   30 mL at 04/11/17 1950  . amiodarone (PACERONE) tablet 200 mg  200 mg Oral Daily 04/13/17, MD   200 mg at 04/12/17 0945  . apixaban (ELIQUIS) tablet 5 mg  5 mg Oral BID 04/14/17, MD   5 mg at 04/12/17 0945  . atorvastatin (LIPITOR) tablet 40 mg  40 mg Per Tube Daily 04/14/17, MD   40 mg at 04/11/17 1827  . docusate sodium (COLACE) capsule 100 mg  100 mg Oral BID 04/13/17, MD   100 mg at 04/12/17 0945  . DULoxetine (CYMBALTA) DR capsule 20 mg  20 mg Oral BID Clapacs, John T, MD   20 mg at 04/12/17 0945  . famotidine (PEPCID) tablet 20 mg  20 mg Per Tube  Daily Oralia Manis, MD   20 mg at 04/12/17 0945  . finasteride (PROSCAR) tablet 5 mg  5 mg Oral Daily Oralia Manis, MD   5 mg at 04/12/17 0944  . folic acid (FOLVITE) tablet 1 mg  1 mg Oral Daily Joely Losier, MD   1 mg at 04/12/17 0945  . gabapentin (NEURONTIN) capsule 200 mg  200 mg Oral TID Oralia Manis, MD   200 mg at 04/12/17 0944  . insulin aspart (novoLOG) injection 0-5 Units  0-5 Units Subcutaneous TID WC Elizabethann Lackey, MD   2 Units at 04/12/17 1235  . levothyroxine (SYNTHROID, LEVOTHROID) tablet 100 mcg  100 mcg Per Tube QAC breakfast Oralia Manis, MD   100 mcg at 04/12/17 0933  . losartan (COZAAR) tablet 25 mg  25 mg  Oral Daily Oralia Manis, MD   25 mg at 04/12/17 0945  . Melatonin TABS 5 mg  5 mg Oral QHS PRN Josia Cueva, MD   5 mg at 04/11/17 1954  . methotrexate (PF) chemo injection 20 mg  20 mg Intramuscular Once Kasandra Fehr, MD      . ondansetron (ZOFRAN) tablet 4 mg  4 mg Oral Q6H PRN Oralia Manis, MD       Or  . ondansetron [facility]) injection 4 mg  4 mg Intravenous Q6H PRN Oralia Manis, MD      . oxyCODONE (Oxy IR/ROXICODONE) immediate release tablet 5 mg  5 mg Oral Q4H PRN Oralia Manis, MD   5 mg at 04/11/17 0535  . traMADol (ULTRAM) tablet 50 mg  50 mg Oral Q6H PRN [name], MD         Discharge Medications: Please see discharge summary for a list of discharge medications.  Relevant Imaging Results:  Relevant Lab Results:   Additional Information SSN: [id number]  Raye Sorrow, [place]

## 2017-04-12 NOTE — Evaluation (Signed)
Physical Therapy Evaluation Patient Details Name: Oscar Brock MRN: [id number] DOB: Apr 19, 1940 Today's Date: 04/12/2017   History of Present Illness  Pt admitted for mild major depression.  PMH includes CVA, Htn, DM, COPD, atrial fibrillation and arthritis.  Clinical Impression  Pt is a 77 year old male who lives in a one story home with wife.  Pt presented with L hemiparesis with sensation mildly intact in foot and hand.  Pt has had a recent CVA and requires mod-max A for bed mobility and max A for sliding board transfer.  Pt required 2 person max A to transfer from bed to chair and stated that the sliding board felt easier to use than the board which they have been using at home.  Pt requires frequent VC's for posture and use of R side to transfer.  PT provided education concerning sliding board transfer, importance of frequent position change and strengthening and ROM exercises bilaterally and avoiding using Hoyer lift as much as possible since pt is able to actively assist in transfers.  Pt will continue to benefit from skilled PT with focus on strength, functional mobility, tolerance to activity and safe use of AD.    Follow Up Recommendations SNF    Equipment Recommendations  Wheelchair (measurements PT)(Family is unsure as to whether or not WC was ordered when pt was at [facility].)    Recommendations for Other Services       Precautions / Restrictions Precautions Precautions: Fall Restrictions Weight Bearing Restrictions: No      Mobility  Bed Mobility Overal bed mobility: Needs Assistance Bed Mobility: Rolling;Supine to Sit;Sit to Supine Rolling: Max assist   Supine to sit: Max assist;HOB elevated Sit to supine: Max assist;HOB elevated   General bed mobility comments: pt is able to initiate movement with R UE and use R LE to cross under L LE and initiate movement.  Pt requires heavy VC's throughout movement.  Pt demosnstrates lateral lean to L side when sitting at EOB. Is able  to correct if instructed.  Transfers Overall transfer level: Needs assistance Equipment used: (sliding board) Transfers: Lateral/Scoot Transfers          Lateral/Scoot Transfers: With slide board;+2 safety/equipment General transfer comment: PT provided multiple VC's for hand placement and posture during transfer.  Pt is able to use R side for support and mobility.  Ambulation/Gait Ambulation/Gait assistance: (Unable to assess.)              Stairs            Wheelchair Mobility    Modified Rankin (Stroke Patients Only)       Balance Overall balance assessment: Needs assistance Sitting-balance support: Single extremity supported     Postural control: Left lateral lean                                   Pertinent Vitals/Pain Pain Assessment: No/denies pain    Home Living Family/patient expects to be discharged to:: Private residence Living Arrangements: Spouse/significant other([facility] services) Available Help at Discharge: Family;Available PRN/intermittently(Grandsons and cousins available to help but not in the middle of the day.) Type of Home: House Home Access: Ramped entrance     Home Layout: One level Home Equipment: Wheelchair - manual(Needs [facility])      Prior Function Level of Independence: Needs assistance      ADL's / Homemaking Assistance Needed: Bed baths  Hand Dominance        Extremity/Trunk Assessment   Upper Extremity Assessment Upper Extremity Assessment: Generalized weakness;LUE deficits/detail LUE Deficits / Details: Hemiplegia related to CVA, pt is unable to initiate movement and reports sensation loss between shoulder and elbow. LUE Sensation: decreased light touch LUE Coordination: decreased fine motor;decreased gross motor    Lower Extremity Assessment Lower Extremity Assessment: Generalized weakness;LLE deficits/detail LLE Sensation: decreased light touch LLE Coordination: decreased gross  motor    Cervical / Trunk Assessment Cervical / Trunk Assessment: Kyphotic  Communication   Communication: HOH  Cognition Arousal/Alertness: Lethargic Behavior During Therapy: WFL for tasks assessed/performed Overall Cognitive Status: History of cognitive impairments - at baseline                                        General Comments      Exercises     Assessment/Plan    PT Assessment Patient needs continued PT services  PT Problem List Decreased strength;Decreased mobility;Decreased range of motion;Decreased coordination;Decreased activity tolerance;Decreased balance;Decreased knowledge of use of DME;Impaired sensation       PT Treatment Interventions DME instruction;Therapeutic activities;Gait training;Therapeutic exercise;Balance training;Functional mobility training;Neuromuscular re-education;Patient/family education    PT Goals (Current goals can be found in the Care Plan section)  Acute Rehab PT Goals Patient Stated Goal: To continue therapy and regain strength and mobility. PT Goal Formulation: With patient/family Time For Goal Achievement: 04/26/17 Potential to Achieve Goals: Good    Frequency Min 2X/week   Barriers to discharge        Co-evaluation               AM-PAC PT "6 Clicks" Daily Activity  Outcome Measure Difficulty turning over in bed (including adjusting bedclothes, sheets and blankets)?: A Lot Difficulty moving from lying on back to sitting on the side of the bed? : A Lot Difficulty sitting down on and standing up from a chair with arms (e.g., wheelchair, bedside commode, etc,.)?: A Lot Help needed moving to and from a bed to chair (including a wheelchair)?: Total Help needed walking in hospital room?: Total Help needed climbing 3-5 steps with a railing? : Total 6 Click Score: 9    End of Session Equipment Utilized During Treatment: Gait belt Activity Tolerance: Patient limited by lethargy;Patient limited by  fatigue Patient left: in chair;with call bell/phone within reach;with chair alarm set;with family/visitor present Nurse Communication: Mobility status PT Visit Diagnosis: Muscle weakness (generalized) (M62.81);Hemiplegia and hemiparesis Hemiplegia - Right/Left: Left Hemiplegia - dominant/non-dominant: Dominant Hemiplegia - caused by: Cerebral infarction    Time: 1025-1100 PT Time Calculation (min) (ACUTE ONLY): 35 min   Charges:   PT Evaluation $PT Eval Moderate Complexity: 1 Mod PT Treatments $Therapeutic Activity: 8-22 mins   PT G Codes:   PT G-Codes **NOT FOR INPATIENT CLASS** Functional Assessment Tool Used: AM-PAC 6 Clicks Basic Mobility    Glenetta Hew, PT, DPT   Glenetta Hew 04/12/2017, 11:10 AM

## 2017-04-12 NOTE — Progress Notes (Signed)
[facility] [place] [facility]   PATIENT NAME: Oscar Brock    MR#:  [id number]  DATE OF BIRTH:  14-Aug-1940  SUBJECTIVE:  CHIEF COMPLAINT: Patient is eating well,  H/o recent CVAs x3, has left upper and lower extremity weakness as residual deficit.  Patient needs assistance and wheelchair bound.  Wife at bedside  REVIEW OF SYSTEMS:  CONSTITUTIONAL: No fever, fatigue or weakness.  EYES: No blurred or double vision.  EARS, NOSE, AND THROAT: No tinnitus or ear pain.  RESPIRATORY: No cough, shortness of breath, wheezing or hemoptysis.  CARDIOVASCULAR: No chest pain, orthopnea, edema.  GASTROINTESTINAL: No nausea, vomiting, diarrhea or abdominal pain.  GENITOURINARY: No dysuria, hematuria.  ENDOCRINE: No polyuria, nocturia,  HEMATOLOGY: No anemia, easy bruising or bleeding SKIN: No rash or lesion. MUSCULOSKELETAL: No joint pain or arthritis.   NEUROLOGIC: No tingling, numbness, weakness.  PSYCHIATRY: Reports depression.   DRUG ALLERGIES:   Allergies  Allergen Reactions  . Effexor Xr [Venlafaxine Hcl Er]   . Flovent Hfa [Fluticasone]   . Lisinopril   . Propoxyphene   . Symbicort [Budesonide-Formoterol Fumarate]     VITALS:  Blood pressure 130/71, pulse 76, temperature 98.2 F (36.8 C), temperature source Oral, resp. rate 16, height 6\' 3"  (1.905 m), weight 94.3 kg (208 lb), SpO2 95 %.  PHYSICAL EXAMINATION:  GENERAL:  77 y.o.-year-old patient lying in the bed with no acute distress.  EYES: Pupils equal, round, reactive to light and accommodation. No scleral icterus. Extraocular muscles intact.  HEENT: Head atraumatic, normocephalic. Oropharynx and nasopharynx clear.  NECK:  Supple, no jugular venous distention. No thyroid enlargement, no tenderness.  LUNGS: Normal breath sounds bilaterally, no wheezing, rales,rhonchi or crepitation. No use of accessory muscles of respiration.  CARDIOVASCULAR: S1, S2 normal. No murmurs, rubs, or gallops.  ABDOMEN:  Soft, nontender, nondistended. Bowel sounds present. No organomegaly or mass.  EXTREMITIES: No pedal edema, cyanosis, or clubbing.  NEUROLOGIC: Cranial nerves II through XII are intact.  Chronic left upper and lower extremity weakness, sensation intact. Gait not checked.  PSYCHIATRIC: The patient is alert and oriented x 3.  SKIN: No obvious rash, lesion, or ulcer.    LABORATORY PANEL:   CBC Recent Labs  Lab 04/12/17 0644  WBC 10.0  HGB 12.6*  HCT 36.8*  PLT 206   ------------------------------------------------------------------------------------------------------------------  Chemistries  Recent Labs  Lab 04/11/17 0712  NA 134*  K 4.1  CL 101  CO2 24  GLUCOSE 169*  BUN 11  CREATININE 0.63  CALCIUM 8.8*   ------------------------------------------------------------------------------------------------------------------  Cardiac Enzymes Recent Labs  Lab 04/10/17 1816  TROPONINI 0.03*   ------------------------------------------------------------------------------------------------------------------  RADIOLOGY:  No results found.  EKG:   Orders placed or performed during the hospital encounter of 05/22/16  . ED EKG  . ED EKG  . EKG 12-Lead  . EKG 12-Lead  . EKG    ASSESSMENT AND PLAN:     Hypoglycemia -secondary to poor p.o. intake from depression resolved Weaned off  D5     History of stroke x3 with left-sided hemiplegia PT consulted-recommending skilled nursing care  Depression-patient seen by psychiatry started on Cymbalta 20 mg twice daily Appreciate psych recommendations Patient denies any suicidal or homicidal ideation    Diabetes (HCC) -avoid anti-glycemic's for now, sliding scale insulin    HTN (hypertension) -continue home meds Cozaar    COPD (chronic obstructive pulmonary disease) (HCC) - inhalers    Atrial fibrillation (HCC) -rate controlled.  Continue Eliquis, amiodarone   PT consult  placed-patient is wheelchair-bound at his  baseline with left-sided hemiplegia  Placement to skilled nursing facility  All the records are reviewed and case discussed with Care Management/Social Workerr. Management plans discussed with the patient, wife and they are in agreement.  CODE STATUS:  fc  TOTAL TIME TAKING CARE OF THIS PATIENT:  35 minutes.   POSSIBLE D/C IN  1DAYS, DEPENDING ON CLINICAL CONDITION.  Note: This dictation was prepared with Dragon dictation along with smaller phrase technology. Any transcriptional errors that result from this process are unintentional.   [name] M.D on 04/12/2017 at 4:39 PM  Between 7am to 6pm - Pager - [phone] After 6pm go to [email or web address] - password [email or web address] [facility]  [facility]  Office  [phone]  CC: Primary care physician; Mickey Farber, MD

## 2017-04-12 NOTE — Plan of Care (Signed)
Patient up to chair with PT today, visiting with family, no episodes of hypoglycemia. MD spoke with family regarding plan of care, patient being evaluated for SNF placement

## 2017-04-12 NOTE — Clinical Social Work Note (Signed)
Clinical Social Work Assessment  Patient Details  Name: Oscar Brock MRN: [id number] Date of Birth: 12-01-1940  Date of referral:  04/12/17               Reason for consult:  Facility Placement, Discharge Planning                Permission sought to share information with:  Case Manager, Facility Medical sales representative, Family Supports Permission granted to share information::     Name::        Agency::  [facility]  Relationship::  Wife at bedside  Contact Information:     Housing/Transportation Living arrangements for the past 2 months:  Skilled Holiday representative, Single Family Home Source of Information:  Medical Team, Facility, Spouse Patient Interpreter Needed:  None Criminal Activity/Legal Involvement Pertinent to Current Situation/Hospitalization:  No - Comment as needed Significant Relationships:  Merchandiser, retail, Other Family Members, Spouse Lives with:  Facility Resident, Spouse Do you feel safe going back to the place where you live?  No Need for family participation in patient care:  Yes (Comment)  Care giving concerns:  Patient admits from home as he was just discharged from SNF 1 day ago where he was receiving therapy for last 39 days. He discharged home and wife reports his blood sugar was unstable, thus they brought him back to hospital.  Family feels he was making good progress and would like him to return to SNF if possible. He has been at [facility].   Social Worker assessment / plan:  LCSW completed assessment and consult for short term placement. Family involved in care and agreeable with return to SNF. [facility] contacted, willing to accept patient back and assist with insurance authorization. Family aware of out of pocket cost due to co-pay days.  Awaiting review of referral and insurance. Plan at this time is SNF at discharge.  Employment status:  Retired Database administrator PT Recommendations:  Skilled Nursing Facility Information /  Referral to community resources:  Skilled Nursing Facility  Patient/Family's Response to care:  Understanding and agreeable  Patient/Family's Understanding of and Emotional Response to Diagnosis, Current Treatment, and Prognosis:  Wife is aware of patient's limitations and discussed his progress at [facility]. She is hopeful he will continue to get stronger as she feels he will be miserable in this condition with hopes patient can return to supervised independence.   Emotional Assessment Appearance:  Appears stated age Attitude/Demeanor/Rapport:    Affect (typically observed):  Calm Orientation:  Oriented to Self, Oriented to Place Alcohol / Substance use:  Not Applicable Psych involvement (Current and /or in the community):  No (Comment)  Discharge Needs  Concerns to be addressed:  Denies Needs/Concerns at this time Readmission within the last 30 days:  Yes Current discharge risk:  None Barriers to Discharge:  Continued Medical Work up   Raye Sorrow, LCSW 04/12/2017, 2:56 PM

## 2017-04-12 NOTE — Evaluation (Signed)
Clinical/Bedside Swallow Evaluation Patient Details  Name: Oscar Brock MRN: [id number] Date of Birth: Feb 15, 1941  Today's Date: 04/12/2017 Time: SLP Start Time (ACUTE ONLY): 1120 SLP Stop Time (ACUTE ONLY): 1220 SLP Time Calculation (min) (ACUTE ONLY): 60 min  Past Medical History:  Past Medical History:  Diagnosis Date  . Arthritis   . Atrial fibrillation (HCC)   . COPD (chronic obstructive pulmonary disease) (HCC)   . Diabetes mellitus without complication (HCC)   . Hypertension   . Stroke [facility])    Past Surgical History:  Past Surgical History:  Procedure Laterality Date  . APPENDECTOMY    . HERNIA REPAIR    . PACEMAKER INSERTION    . PEG TUBE PLACEMENT     HPI:  Pt is a 77 y.o. male w/ h/o multiple CVAs, DM, afib, HTN, COPD who presents to the emergency department today because of concern for low blood sugar. The patient had a recent CVA in November 2018 and was in [place] for several weeks post that until his d/c home ~2 days ago b/f his immediate admission here. Pt has Left sided weakness residual post the CVA, Rehab. Apparently he has diabetes but had previously been managing it by diet and exercise until the CVA when they put him on insulin. Family states that they have been having a hard time managing the sugars after coming home. Last night they got low into the forties and he was given juice which brought it up into the eighties. It then got low again today. He has only had 6 units of insulin today. He denies any fevers, chest pain or shortness of breath. Has had sweating when his blood sugars go low. Per CM note, patient admits from home as he was just discharged from SNF 1 day ago where he was receiving therapy for last 39 days. He discharged home and wife reports his blood sugar was unstable, thus they brought him back to hospital. Pt and wife stated he feels he was making good progress at [facility](SNF). Psychiatry is following pt while admitted.    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 Pt has been on a Nectar consistency liquid as part of his diet (baseline) post CVA per MBSS report in 02/2017 d/t aspiration w/ thin liquids(see chart notes for further details). Pt consumed trials of Nectar liquids during this session w/ no overt s/s of aspiration noted; clear vocal quality b/f trials, no decline in respiratory status post trials. Oral phase appeared grossly wfl for bolus management and oral clearing w/ trials given; wife stated pt eats a cut/chopped meats diet for ease of mastication. NSG and wife have reported no deficits w/ such at his meals. Recommend the modified diet of Dysphagia level 3(mech soft) w/ well-chopped meats; Nectar consistency liquids; aspiration precautions; Pills in Puree - whole as able. Pt appears at his baseline as per his discharge recommendations from recent Rehab at SNF, but if returning to Rehab, he could continue w/ therapy; may benefit from a repeat objective swallow study in the future to determine degree of dysphagia w/ thin liquids. NSG and wife updated; agreed. Education given on aspiration precautions; food/liquid consistencies in his diet; ordering information for thickening products/thickened liquids. Handouts given.  SLP Visit Diagnosis: Dysphagia, oropharyngeal phase (R13.12);Dysphagia, pharyngeal phase (R13.13)    Aspiration Risk  Mild aspiration risk;Moderate aspiration risk    Diet Recommendation  Dysphagia level 3(well-chopped meats, moistened); NECTAR consistency liquids.  Aspiration precautions; assistance at meals.   Medication Administration: Whole meds with puree    Other  Recommendations Recommended Consults: (Dietician f/u) Oral Care Recommendations: Oral care BID;Patient independent with oral care;Staff/trained caregiver to provide oral care Other Recommendations: Order thickener from pharmacy;Prohibited food (jello, ice  cream, thin soups);Remove water pitcher;Have oral suction available   Follow up Recommendations Skilled Nursing facility      Frequency and Duration (TBD)  (TBD)       Prognosis Prognosis for Safe Diet Advancement: Fair Barriers to Reach Goals: Severity of deficits      Swallow Study   General Date of Onset: 04/10/17 HPI: Pt is a 77 y.o. male w/ h/o multiple CVAs, DM, afib, HTN, COPD who presents to the emergency department today because of concern for low blood sugar. The patient had a recent CVA in November 2018 and was in [place] for several weeks post that until his d/c home ~2 days ago b/f his immediate admission here. Pt has Left sided weakness residual post the CVA, Rehab. Apparently he has diabetes but had previously been managing it by diet and exercise until the CVA when they put him on insulin. Family states that they have been having a hard time managing the sugars after coming home. Last night they got low into the forties and he was given juice which brought it up into the eighties. It then got low again today. He has only had 6 units of insulin today. He denies any fevers, chest pain or shortness of breath. Has had sweating when his blood sugars go low. Per CM note, patient admits from home as he was just discharged from SNF 1 day ago where he was receiving therapy for last 39 days. He discharged home and wife reports his blood sugar was unstable, thus they brought him back to hospital. Pt and wife stated he feels he was making good progress at [facility](SNF). Psychiatry is following pt while admitted.  Type of Study: Bedside Swallow Evaluation Previous Swallow Assessment: Nov. 2018; SNF f/u tx Diet Prior to this Study: Dysphagia 3 (soft);Nectar-thick liquids(at SNF) Temperature Spikes Noted: No(wbc 10.0) Respiratory Status: Room air History of Recent Intubation: No Behavior/Cognition: Alert;Cooperative;Pleasant mood Oral Cavity Assessment: Within Functional Limits Oral Care  Completed by SLP: Recent completion by staff Oral Cavity - Dentition: Adequate natural dentition Vision: Functional for self-feeding Self-Feeding Abilities: Able to feed self;Needs assist;Needs set up Patient Positioning: Upright in chair Baseline Vocal Quality: Low vocal intensity(slower rate) Volitional Cough: Strong Volitional Swallow: Able to elicit    Oral/Motor/Sensory Function Overall Oral Motor/Sensory Function: Mild impairment(Left labial-facial - slight)   Ice Chips Ice chips: Not tested   Thin Liquid Thin Liquid: Not tested    Nectar Thick Nectar Thick Liquid: Within functional limits Presentation: Cup;Self Fed(~3 ozs ) Other Comments: Nectar consistency is baseline liquid consistency post CVA per MBSS report   Honey Thick Honey Thick Liquid: Not tested   Puree Puree: Within functional limits Presentation: Spoon;Self Fed(2-3 trials)   Solid   GO   Solid: Impaired Other Comments: pt has meats cut well for ease of mastication/coordination per wife    Functional Assessment Tool Used: clinical judgement Functional Limitations: Swallowing Swallow Current Status ([id number]): At least 20 percent but less than 40 percent impaired, limited or restricted Swallow Goal Status [phone]): At least 20 percent but less than 40 percent impaired, limited or restricted Swallow Discharge Status [phone]): At least 20 percent but less than 40 percent impaired, limited or restricted  Jerilynn Som, MS, CCC-SLP Felicia Bloomquist 04/12/2017,5:38 PM

## 2017-04-12 NOTE — Consult Note (Signed)
[place] [facility] Consult   Reason for Consult: Follow-up consult for 77 year old man with recent stroke because of concern about depression and anxiety Referring Physician:  Gouru Patient Identification: Oscar Brock MRN:  [id number] Principal Diagnosis: Mild major depression, single episode [facility]) Diagnosis:   Patient Active Problem List   Diagnosis Date Noted  . HTN (hypertension) [I10] 04/11/2017  . Diabetes ([place]) [E11.9] 04/11/2017  . History of stroke [Z86.73] 04/11/2017  . COPD (chronic obstructive pulmonary disease) ([place]) [J44.9] 04/11/2017  . Atrial fibrillation ([place]) [I48.91] 04/11/2017  . Hypoglycemia [E16.2] 04/11/2017  . Mild major depression, single episode ([place]) [F32.0] 04/11/2017    Total Time spent with patient: 30 minutes  Subjective:   Oscar Brock is a 77 y.o. male patient admitted with "trying to have a positive attitude".  HPI: See note from yesterday.  77 year old man had a stroke a couple months ago.  When he went home he became hypoglycemic and had to come right back into the hospital.  Concern raised by the patient and his family that he has been more anxious and irritable and down.  Patient has no new complaints today.  He has been told that the recommendation is that he go back to rehab for a while.  He says that while it is not his first choice that he understands the point of it and that he is trying to have a positive attitude and will work hard so that he can regain as much function as possible.  Has no complaints of anything new that would be a side effect of his medicine.  No suicidal ideation no evidence of confusion  Past Psychiatric History: No prior psychiatric treatment.  See previous note  Risk to Self: Is patient at risk for suicide?: No Risk to Others:   Prior Inpatient Therapy:   Prior Outpatient Therapy:    Past Medical History:  Past Medical History:  Diagnosis Date  . Arthritis   . Atrial fibrillation ([place])   . COPD  (chronic obstructive pulmonary disease) ([place])   . Diabetes mellitus without complication ([place])   . Hypertension   . Stroke [facility])     Past Surgical History:  Procedure Laterality Date  . APPENDECTOMY    . HERNIA REPAIR    . PACEMAKER INSERTION    . PEG TUBE PLACEMENT     Family History:  Family History  Problem Relation Age of Onset  . Heart failure Mother   . Clotting disorder Mother   . Heart failure Father   . Hypertension Father   . Diabetes Father   . Stroke Brother   . Lung cancer Sister    Family Psychiatric  History: None except for some anxiety and a brother Social History:  Social History   Substance and Sexual Activity  Alcohol Use No     Social History   Substance and Sexual Activity  Drug Use No    Social History   Socioeconomic History  . Marital status: Married    Spouse name: None  . Number of children: None  . Years of education: None  . Highest education level: None  Social Needs  . Financial resource strain: Not hard at all  . Food insecurity - worry: Patient refused  . Food insecurity - inability: Patient refused  . Transportation needs - medical: Patient refused  . Transportation needs - non-medical: Patient refused  Occupational History  . None  Tobacco Use  . Smoking status: Former Smoker    Last attempt  to quit: 1990    Years since quitting: 29.0  . Smokeless tobacco: Never Used  Substance and Sexual Activity  . Alcohol use: No  . Drug use: No  . Sexual activity: Not Currently  Other Topics Concern  . None  Social History Narrative  . None   Additional Social History:    Allergies:   Allergies  Allergen Reactions  . Effexor Xr [Venlafaxine Hcl Er]   . Flovent Hfa [Fluticasone]   . Lisinopril   . Propoxyphene   . Symbicort [Budesonide-Formoterol Fumarate]     Labs:  Results for orders placed or performed during the hospital encounter of 04/10/17 (from the past 48 hour(s))  Glucose, capillary     Status: Abnormal    Collection Time: 04/10/17  5:57 PM  Result Value Ref Range   Glucose-Capillary 38 (LL) 65 - 99 mg/dL    Comment: REPEATED TO VERIFY, CHARGE CREDITED Performed at [facility], [address] [place], [place] [address]    Comment 1 Repeat Test   Glucose, capillary     Status: Abnormal   Collection Time: 04/10/17  6:00 PM  Result Value Ref Range   Glucose-Capillary 34 (LL) 65 - 99 mg/dL   Comment 1 Call MD NNP PA CNM   CBC with Differential     Status: Abnormal   Collection Time: 04/10/17  6:16 PM  Result Value Ref Range   WBC 13.7 (H) 3.8 - 10.6 K/uL   RBC 4.01 (L) 4.40 - 5.90 MIL/uL   Hemoglobin 12.5 (L) 13.0 - 18.0 g/dL   HCT 37.9 (L) 40.0 - 52.0 %   MCV 94.7 80.0 - 100.0 fL   MCH 31.2 26.0 - 34.0 pg   MCHC 33.0 32.0 - 36.0 g/dL   RDW 15.4 (H) 11.5 - 14.5 %   Platelets 277 150 - 440 K/uL   Neutrophils Relative % 73 %   Neutro Abs 10.0 (H) 1.4 - 6.5 K/uL   Lymphocytes Relative 14 %   Lymphs Abs 2.0 1.0 - 3.6 K/uL   Monocytes Relative 12 %   Monocytes Absolute 1.6 (H) 0.2 - 1.0 K/uL   Eosinophils Relative 1 %   Eosinophils Absolute 0.1 0 - 0.7 K/uL   Basophils Relative 0 %   Basophils Absolute 0.1 0 - 0.1 K/uL    Comment: Performed at [facility], [address], [place], [place] [address]  Basic metabolic panel     Status: Abnormal   Collection Time: 04/10/17  6:16 PM  Result Value Ref Range   Sodium 137 135 - 145 mmol/L   Potassium 3.5 3.5 - 5.1 mmol/L   Chloride 103 101 - 111 mmol/L   CO2 27 22 - 32 mmol/L   Glucose, Bld 103 (H) 65 - 99 mg/dL   BUN 15 6 - 20 mg/dL   Creatinine, Ser 0.77 0.61 - 1.24 mg/dL   Calcium 8.7 (L) 8.9 - 10.3 mg/dL   GFR calc non Af Amer >60 >60 mL/min   GFR calc Af Amer >60 >60 mL/min    Comment: (NOTE) The eGFR has been calculated using the CKD EPI equation. This calculation has not been validated in all clinical situations. eGFR's persistently <60 mL/min signify possible Chronic Kidney Disease.    Anion gap 7 5 - 15     Comment: Performed at [facility], [address] [place], [place] [address]  Troponin I     Status: Abnormal   Collection Time: 04/10/17  6:16 PM  Result Value Ref Range   Troponin I 0.03 (HH) <0.03 ng/mL    Comment: CRITICAL RESULT CALLED TO, READ BACK BY AND VERIFIED WITH MEGAN JONES @ 1916 ON 04/10/2017 BY CAF Performed at [facility], [address] [place], [place] [address]   Glucose, capillary     Status: Abnormal   Collection Time: 04/10/17  6:17 PM  Result Value Ref Range   Glucose-Capillary 140 (H) 65 - 99 mg/dL  Glucose, capillary     Status: None   Collection Time: 04/10/17  6:58 PM  Result Value Ref Range   Glucose-Capillary 67 65 - 99 mg/dL  Glucose, capillary     Status: None   Collection Time: 04/10/17  8:01 PM  Result Value Ref Range   Glucose-Capillary 78 65 - 99 mg/dL  Urinalysis, Complete w Microscopic     Status: Abnormal   Collection Time: 04/10/17  8:40 PM  Result Value Ref Range   Color, Urine YELLOW (A) YELLOW   APPearance HAZY (A) CLEAR   Specific Gravity, Urine 1.018 1.005 - 1.030   pH 6.0 5.0 - 8.0   Glucose, UA 50 (A) NEGATIVE mg/dL   Hgb urine dipstick NEGATIVE NEGATIVE   Bilirubin Urine NEGATIVE NEGATIVE   Ketones, ur NEGATIVE NEGATIVE mg/dL   Protein, ur NEGATIVE NEGATIVE mg/dL   Nitrite NEGATIVE NEGATIVE   Leukocytes, UA NEGATIVE NEGATIVE   RBC / HPF 0-5 0 - 5 RBC/hpf   WBC, UA 0-5 0 - 5 WBC/hpf   Bacteria, UA NONE SEEN NONE SEEN   Squamous Epithelial / LPF NONE SEEN NONE SEEN   Mucus PRESENT    Amorphous Crystal PRESENT     Comment: Performed at [facility], [address] [place], [place] [address]  Glucose, capillary     Status: None   Collection Time: 04/10/17  9:25 PM  Result Value Ref Range   Glucose-Capillary 86 65 - 99 mg/dL  Glucose, capillary     Status: Abnormal   Collection Time: 04/10/17 10:55 PM  Result Value Ref Range   Glucose-Capillary 57 (L) 65 - 99 mg/dL  Glucose, capillary      Status: None   Collection Time: 04/11/17  1:13 AM  Result Value Ref Range   Glucose-Capillary 93 65 - 99 mg/dL  Glucose, capillary     Status: Abnormal   Collection Time: 04/11/17  2:29 AM  Result Value Ref Range   Glucose-Capillary 165 (H) 65 - 99 mg/dL  Glucose, capillary     Status: Abnormal   Collection Time: 04/11/17  3:29 AM  Result Value Ref Range   Glucose-Capillary 152 (H) 65 - 99 mg/dL  Glucose, capillary     Status: Abnormal   Collection Time: 04/11/17  5:06 AM  Result Value Ref Range   Glucose-Capillary 153 (H) 65 - 99 mg/dL  Basic metabolic panel     Status: Abnormal   Collection Time: 04/11/17  7:12 AM  Result Value Ref Range   Sodium 134 (L) 135 - 145 mmol/L   Potassium 4.1 3.5 - 5.1 mmol/L   Chloride 101 101 - 111 mmol/L   CO2 24 22 - 32 mmol/L   Glucose, Bld 169 (H) 65 - 99 mg/dL   BUN 11 6 - 20 mg/dL   Creatinine, Ser 0.63 0.61 - 1.24 mg/dL   Calcium 8.8 (L) 8.9 - 10.3 mg/dL   GFR calc non Af Amer >60 >60 mL/min   GFR calc Af Amer >60 >60 mL/min    Comment: (  NOTE) The eGFR has been calculated using the CKD EPI equation. This calculation has not been validated in all clinical situations. eGFR's persistently <60 mL/min signify possible Chronic Kidney Disease.    Anion gap 9 5 - 15    Comment: Performed at [facility], [address] [place], [place] [address]  CBC     Status: Abnormal   Collection Time: 04/11/17  7:12 AM  Result Value Ref Range   WBC 12.0 (H) 3.8 - 10.6 K/uL   RBC 3.99 (L) 4.40 - 5.90 MIL/uL   Hemoglobin 12.5 (L) 13.0 - 18.0 g/dL   HCT 37.2 (L) 40.0 - 52.0 %   MCV 93.3 80.0 - 100.0 fL   MCH 31.2 26.0 - 34.0 pg   MCHC 33.5 32.0 - 36.0 g/dL   RDW 15.5 (H) 11.5 - 14.5 %   Platelets 235 150 - 440 K/uL    Comment: Performed at [facility], [address] [place], [place] [address]  Glucose, capillary     Status: Abnormal   Collection Time: 04/11/17  7:30 AM  Result Value Ref Range   Glucose-Capillary 164 (H) 65  - 99 mg/dL  Glucose, capillary     Status: Abnormal   Collection Time: 04/11/17 11:43 AM  Result Value Ref Range   Glucose-Capillary 206 (H) 65 - 99 mg/dL  Glucose, capillary     Status: Abnormal   Collection Time: 04/11/17  3:59 PM  Result Value Ref Range   Glucose-Capillary 232 (H) 65 - 99 mg/dL  Glucose, capillary     Status: Abnormal   Collection Time: 04/11/17  6:11 PM  Result Value Ref Range   Glucose-Capillary 208 (H) 65 - 99 mg/dL  Glucose, capillary     Status: Abnormal   Collection Time: 04/11/17  9:22 PM  Result Value Ref Range   Glucose-Capillary 156 (H) 65 - 99 mg/dL  CBC     Status: Abnormal   Collection Time: 04/12/17  6:44 AM  Result Value Ref Range   WBC 10.0 3.8 - 10.6 K/uL   RBC 3.96 (L) 4.40 - 5.90 MIL/uL   Hemoglobin 12.6 (L) 13.0 - 18.0 g/dL   HCT 36.8 (L) 40.0 - 52.0 %   MCV 93.0 80.0 - 100.0 fL   MCH 31.9 26.0 - 34.0 pg   MCHC 34.3 32.0 - 36.0 g/dL   RDW 15.2 (H) 11.5 - 14.5 %   Platelets 206 150 - 440 K/uL    Comment: Performed at [facility] [address] [place], [place] [address]  Glucose, capillary     Status: Abnormal   Collection Time: 04/12/17  7:41 AM  Result Value Ref Range   Glucose-Capillary 141 (H) 65 - 99 mg/dL  Glucose, capillary     Status: Abnormal   Collection Time: 04/12/17  8:47 AM  Result Value Ref Range   Glucose-Capillary 145 (H) 65 - 99 mg/dL  Glucose, capillary     Status: Abnormal   Collection Time: 04/12/17 11:46 AM  Result Value Ref Range   Glucose-Capillary 228 (H) 65 - 99 mg/dL    Current Facility-Administered Medications  Medication Dose Route Frequency Provider Last Rate Last Dose  . acetaminophen (TYLENOL) tablet 650 mg  650 mg Oral Q6H PRN Lance Coon, MD   650 mg at 04/12/17 0556   Or  . acetaminophen (TYLENOL) suppository 650 mg  650 mg Rectal Q6H PRN Lance Coon, MD      . alum & mag hydroxide-simeth (MAALOX/MYLANTA) 200-200-20 MG/5ML  suspension 30 mL  30 mL Oral Q6H PRN Vaughan Basta, MD   30 mL at 04/11/17 1950  . amiodarone (PACERONE) tablet 200 mg  200 mg Oral Daily Lance Coon, MD   200 mg at 04/12/17 0945  . apixaban (ELIQUIS) tablet 5 mg  5 mg Oral BID Lance Coon, MD   5 mg at 04/12/17 0945  . atorvastatin (LIPITOR) tablet 40 mg  40 mg Per Tube Daily Lance Coon, MD   40 mg at 04/11/17 1827  . docusate sodium (COLACE) capsule 100 mg  100 mg Oral BID Vaughan Basta, MD   100 mg at 04/12/17 0945  . DULoxetine (CYMBALTA) DR capsule 20 mg  20 mg Oral BID Calton Harshfield T, MD   20 mg at 04/12/17 0945  . famotidine (PEPCID) tablet 20 mg  20 mg Per Tube Daily Lance Coon, MD   20 mg at 04/12/17 0945  . finasteride (PROSCAR) tablet 5 mg  5 mg Oral Daily Lance Coon, MD   5 mg at 04/12/17 0944  . folic acid (FOLVITE) tablet 1 mg  1 mg Oral Daily Gouru, Aruna, MD   1 mg at 04/12/17 0945  . gabapentin (NEURONTIN) capsule 200 mg  200 mg Oral TID Lance Coon, MD   200 mg at 04/12/17 1633  . insulin aspart (novoLOG) injection 0-5 Units  0-5 Units Subcutaneous TID WC Gouru, Aruna, MD   2 Units at 04/12/17 1235  . levothyroxine (SYNTHROID, LEVOTHROID) tablet 100 mcg  100 mcg Per Tube QAC breakfast Lance Coon, MD   100 mcg at 04/12/17 0933  . losartan (COZAAR) tablet 25 mg  25 mg Oral Daily Lance Coon, MD   25 mg at 04/12/17 0945  . Melatonin TABS 5 mg  5 mg Oral QHS PRN Gouru, Aruna, MD   5 mg at 04/11/17 1954  . methotrexate (PF) chemo injection 20 mg  20 mg Intramuscular Once Gouru, Aruna, MD      . ondansetron (ZOFRAN) tablet 4 mg  4 mg Oral Q6H PRN Lance Coon, MD       Or  . ondansetron [facility]) injection 4 mg  4 mg Intravenous Q6H PRN Lance Coon, MD      . oxyCODONE (Oxy IR/ROXICODONE) immediate release tablet 5 mg  5 mg Oral Q4H PRN Lance Coon, MD   5 mg at 04/11/17 0535  . traMADol (ULTRAM) tablet 50 mg  50 mg Oral Q6H PRN Nicholes Mango, MD        Musculoskeletal: Strength & Muscle Tone: decreased Gait & Station:  unsteady Patient leans: N/A  Psychiatric Specialty Exam: Physical Exam  Nursing note and vitals reviewed. Constitutional: He appears well-developed and well-nourished.  HENT:  Head: Normocephalic and atraumatic.  Eyes: Conjunctivae are normal. Pupils are equal, round, and reactive to light.  Neck: Normal range of motion.  Cardiovascular: Regular rhythm and normal heart sounds.  Respiratory: Effort normal. No respiratory distress.  GI: Soft.  Musculoskeletal: Normal range of motion.  Neurological: He is alert.  Left-sided weakness no change from yesterday  Skin: Skin is warm and dry.  Psychiatric: He has a normal mood and affect. Judgment normal. His speech is delayed. He is slowed. Thought content is not paranoid. Cognition and memory are impaired. He expresses no homicidal and no suicidal ideation.    Review of Systems  Constitutional: Negative.   HENT: Negative.   Eyes: Negative.   Respiratory: Negative.   Cardiovascular: Negative.   Gastrointestinal: Negative.   Musculoskeletal: Positive for  joint pain.  Skin: Negative.   Neurological: Negative.   Psychiatric/Behavioral: Positive for depression. Negative for hallucinations, memory loss, substance abuse and suicidal ideas. The patient is nervous/anxious. The patient does not have insomnia.     Blood pressure 130/71, pulse 76, temperature 98.2 F (36.8 C), temperature source Oral, resp. rate 16, height '6\' 3"'  (1.905 m), weight 94.3 kg (208 lb), SpO2 95 %.Body mass index is 26 kg/m.  General Appearance: Fairly Groomed  Eye Contact:  Good  Speech:  Clear and Coherent and Slow  Volume:  Decreased  Mood:  Euthymic  Affect:  Constricted  Thought Process:  Goal Directed  Orientation:  Full (Time, Place, and Person)  Thought Content:  Logical  Suicidal Thoughts:  No  Homicidal Thoughts:  No  Memory:  Immediate;   Good Recent;   Fair Remote;   Fair  Judgement:  Good  Insight:  Good  Psychomotor Activity:  Decreased   Concentration:  Concentration: Fair  Recall:  [employer] of Knowledge:  Fair  Language:  Fair  Akathisia:  No  Handed:  Right  AIMS (if indicated):     Assets:  Desire for Improvement Financial Resources/Insurance Housing Resilience Social Support  ADL's:  Impaired  Cognition:  Impaired,  Mild  Sleep:        Treatment Plan Summary: Medication management and Plan 77 year old man was started on Cymbalta yesterday.  So far no sign of any problems.  Would not expect to see any improvement after a single day.  Repeated to the patient and to his wife and son who are present today the purpose of the medication.  For now I would recommend continuing the 20 mg twice a day.  He will need to have this monitored and followed up at rehab and thereafter.  Dose might need to be increased after a week or 2.  Might need to be changed if no improvement or if the medicine is not tolerated.  No change in medicine recommended for today however.  Supportive counseling and a lot of encouragement to keep his positive attitude.  Disposition: No evidence of imminent risk to self or others at present.   Patient does not meet criteria for psychiatric inpatient admission. Supportive therapy provided about ongoing stressors.  Alethia Berthold, MD 04/12/2017 4:41 PM

## 2017-04-13 LAB — GLUCOSE, CAPILLARY
GLUCOSE-CAPILLARY: 193 mg/dL — AB (ref 65–99)
Glucose-Capillary: 143 mg/dL — ABNORMAL HIGH (ref 65–99)
Glucose-Capillary: 169 mg/dL — ABNORMAL HIGH (ref 65–99)

## 2017-04-13 MED ORDER — METHOTREXATE SODIUM CHEMO INJECTION 50 MG/2ML
17.5000 mg | INTRAMUSCULAR | Status: DC
  Filled 2017-04-13: qty 0.7

## 2017-04-13 MED ORDER — DULOXETINE HCL 20 MG PO CPEP: 20.0000 mg | ORAL_CAPSULE | Freq: Two times a day (BID) | ORAL | 1 refills | Status: DC

## 2017-04-13 MED ORDER — DOCUSATE SODIUM 100 MG PO CAPS: 100.0000 mg | ORAL_CAPSULE | Freq: Two times a day (BID) | ORAL | 0 refills | Status: DC | PRN

## 2017-04-13 MED ORDER — INSULIN ASPART 100 UNIT/ML ~~LOC~~ SOLN: 0.0000 [IU] | Freq: Three times a day (TID) | SUBCUTANEOUS | 11 refills | Status: DC

## 2017-04-13 MED ORDER — OXYCODONE HCL 5 MG PO TABS: 5.0000 mg | ORAL_TABLET | ORAL | 0 refills | Status: DC | PRN

## 2017-04-13 MED ORDER — TRAMADOL HCL 50 MG PO TABS: 50.0000 mg | ORAL_TABLET | Freq: Four times a day (QID) | ORAL | 0 refills | Status: DC | PRN

## 2017-04-13 NOTE — Clinical Social Work Placement (Addendum)
Patient has been accepted to [facility] today. Patient can arrive after 2:00pm per facility. Patient will be going to 611 Report:  [phone] [name] Patient wife in room and understanding of plan and in agreement.   [place] agreeable to EMS. LCSW will arrange. NO other needs at this time. DC to SNF this afternoon.   CLINICAL SOCIAL WORK PLACEMENT  NOTE  Date:  04/13/2017  Patient Details  Name: Oscar Brock MRN: [id number] Date of Birth: 1940/12/12  Clinical Social Work is seeking post-discharge placement for this patient at the Skilled  Nursing Facility level of care (*CSW will initial, date and re-position this form in  chart as items are completed):  Yes   Patient/family provided with [facility] Clinical Social Work Department's list of facilities offering this level of care within the geographic area requested by the patient (or if unable, by the patient's family).  Yes   Patient/family informed of their freedom to choose among providers that offer the needed level of care, that participate in Medicare, Medicaid or managed care program needed by the patient, have an available bed and are willing to accept the patient.  Yes   Patient/family informed of [facility]'s ownership interest in [facility] and [facility], as well as of the fact that they are under no obligation to receive care at these facilities.  PASRR submitted to EDS on       PASRR number received on       Existing PASRR number confirmed on 04/12/17     FL2 transmitted to all facilities in geographic area requested by pt/family on 04/12/17     FL2 transmitted to all facilities within larger geographic area on       Patient informed that his/her managed care company has contracts with or will negotiate with certain facilities, including the following:            Patient/family informed of bed offers received. 04/13/2017   Patient chooses bed at     [facility]   Physician recommends and  patient chooses bed at      SNF Patient to be transferred to   on  .  04/13/2017   Patient to be transferred to facility by     EMS  Patient family notified on   of transfer. Wife  Name of family member notified:        PHYSICIAN Please sign FL2     Additional Comment:    _______________________________________________ Raye Sorrow, LCSW 04/13/2017, 11:48 AM

## 2017-04-13 NOTE — Discharge Summary (Addendum)
[facility] - [place] at [facility]   PATIENT NAME: Oscar Brock    MR#:  [id number]  DATE OF BIRTH:  12/20/1940  DATE OF ADMISSION:  04/10/2017 ADMITTING PHYSICIAN: Oralia Manis, MD  DATE OF DISCHARGE:  04/13/17   PRIMARY CARE PHYSICIAN: Mickey Farber, MD    ADMISSION DIAGNOSIS:  Hypoglycemia [E16.2]  DISCHARGE DIAGNOSIS:  Principal Problem:   Mild major depression, single episode (HCC) Active Problems:   HTN (hypertension)   Diabetes (HCC)   History of stroke   COPD (chronic obstructive pulmonary disease) (HCC)   Atrial fibrillation (HCC)   Hypoglycemia   SECONDARY DIAGNOSIS:   Past Medical History:  Diagnosis Date  . Arthritis   . Atrial fibrillation (HCC)   . COPD (chronic obstructive pulmonary disease) (HCC)   . Diabetes mellitus without complication (HCC)   . Hypertension   . Stroke [facility])     HOSPITAL COURSE:   HPI Oscar Brock  is a 77 y.o. male who presents with cyst and hypoglycemia.  Patient recently had a stroke and was in the nursing facility for rehab where he was found to have significant hypoglycemia even despite minimal anti-glycemic medication.  Here in the ED he continues to have low blood sugars requiring dextrose infusion.  Hospitalist were called for admission  Hypoglycemia -secondary to poor p.o. intake from depression resolved Weaned off  D5     History of stroke x3 with left-sided hemiplegia PT consulted-recommending skilled nursing care  Depression-patient seen by psychiatry started on Cymbalta 20 mg twice daily Appreciate psych recommendations Patient denies any suicidal or homicidal ideation  Diabetes (HCC) -avoid anti-glycemic's for now, CUSTOM sliding scale insulin  HTN (hypertension) -continue home meds Cozaar  COPD (chronic obstructive pulmonary disease) (HCC) - inhalers  Atrial fibrillation (HCC) -rate controlled.  Continue Eliquis, amiodarone   PT consult placed-patient is  wheelchair-bound at his baseline with left-sided hemiplegia  Placement to [facility] facility[facility]      DISCHARGE CONDITIONS:  FAIR  CONSULTS OBTAINED:  Treatment Team:  Audery Amel, MD   PROCEDURES  None   DRUG ALLERGIES:   Allergies  Allergen Reactions  . Effexor Xr [Venlafaxine Hcl Er]   . Flovent Hfa [Fluticasone]   . Lisinopril   . Propoxyphene   . Symbicort [Budesonide-Formoterol Fumarate]     DISCHARGE MEDICATIONS:   Allergies as of 04/13/2017      Reactions   Effexor Xr [venlafaxine Hcl Er]    Flovent Hfa [fluticasone]    Lisinopril    Propoxyphene    Symbicort [budesonide-formoterol Fumarate]       Medication List    STOP taking these medications   azithromycin 250 MG tablet Commonly known as:  ZITHROMAX Z-PAK   insulin NPH Human 100 UNIT/ML injection Commonly known as:  HUMULIN N,NOVOLIN N     TAKE these medications   acetaminophen 500 MG tablet Commonly known as:  TYLENOL Take 500 mg by mouth every 6 (six) hours as needed.   amiodarone 200 MG tablet Commonly known as:  PACERONE Take 200 mg by mouth daily.   apixaban 5 MG Tabs tablet Commonly known as:  ELIQUIS Take 5 mg by mouth 2 (two) times daily.   atorvastatin 40 MG tablet Commonly known as:  LIPITOR Place 1 tablet into feeding tube daily.   docusate sodium 100 MG capsule Commonly known as:  COLACE Take 1 capsule (100 mg total) by mouth 2 (two) times daily as needed for mild constipation.   DULoxetine 20  MG capsule Commonly known as:  CYMBALTA Take 1 capsule (20 mg total) by mouth 2 (two) times daily.   famotidine 20 MG tablet Commonly known as:  PEPCID Place 20 mg into feeding tube daily.   finasteride 5 MG tablet Commonly known as:  PROSCAR Take 1 tablet by mouth daily.   folic acid 1 MG tablet Commonly known as:  FOLVITE Take 1 mg by mouth daily.   gabapentin 100 MG capsule Commonly known as:  NEURONTIN Take 200 mg by mouth 3 (three) times  daily.   insulin aspart 100 UNIT/ML injection Commonly known as:  novoLOG Inject 0-5 Units into the skin 3 (three) times daily with meals. CBG < 70: Implement Hypoglycemia Protocol. CBG 70 - 120 (dose in units): 0 CBG 121 - 150 (dose in units): 0 CBG 151 - 200 (dose in units): 1 CBG 201 - 250 (dose in units): 2 CBG 251 - 300 (dose in units): 3 CBG 301 - 350 (dose in units): 4 CBG 351 - 400 (dose in units): 5   ipratropium 0.02 % nebulizer solution Commonly known as:  ATROVENT Take 0.5 mg by nebulization 4 (four) times daily.   levothyroxine 100 MCG tablet Commonly known as:  SYNTHROID, LEVOTHROID Place 1 tablet into feeding tube daily.   losartan 25 MG tablet Commonly known as:  COZAAR Take 25 mg by mouth daily.   Melatonin 3 MG Caps Take 1 capsule by mouth at bedtime.   methotrexate (PF) 200 MG/8ML injection Inject 20 mg into the skin once a week.   oxyCODONE 5 MG immediate release tablet Commonly known as:  Oxy IR/ROXICODONE Take 1 tablet (5 mg total) by mouth every 4 (four) hours as needed for severe pain.   traMADol 50 MG tablet Commonly known as:  ULTRAM Take 1 tablet (50 mg total) by mouth every 6 (six) hours as needed for moderate pain.        DISCHARGE INSTRUCTIONS:  FOLLOW-UP WITH PRIMARY CARE PHYSICIAN IN 5-7 DAYS AT THE FACILITY fOLLOW-UP WITH NEUROLOGY AS SCHEDULED OR IN A WEEK    DIET:  Cardiac diet and Diabetic diet  DISCHARGE CONDITION:  Fair  ACTIVITY:  Activity as tolerated per PT  OXYGEN:  Home Oxygen: No.   Oxygen Delivery: room air  DISCHARGE LOCATION:  nursing home   If you experience worsening of your admission symptoms, develop shortness of breath, life threatening emergency, suicidal or homicidal thoughts you must seek medical attention immediately by calling 911 or calling your MD immediately  if symptoms less severe.  You Must read complete instructions/literature along with all the possible adverse reactions/side effects  for all the Medicines you take and that have been prescribed to you. Take any new Medicines after you have completely understood and accpet all the possible adverse reactions/side effects.   Please note  You were cared for by a hospitalist during your hospital stay. If you have any questions about your discharge medications or the care you received while you were in the hospital after you are discharged, you can call the unit and asked to speak with the hospitalist on call if the hospitalist that took care of you is not available. Once you are discharged, your primary care physician will handle any further medical issues. Please note that NO REFILLS for any discharge medications will be authorized once you are discharged, as it is imperative that you return to your primary care physician (or establish a relationship with a primary care physician if you do not  have one) for your aftercare needs so that they can reassess your need for medications and monitor your lab values.     Today  Chief Complaint  Patient presents with  . Hypoglycemia   Patient is doing fine. Denies any complaints today. Wife at bedside  ROS:  CONSTITUTIONAL: Denies fevers, chills. Denies any fatigue, weakness.  EYES: Denies blurry vision, double vision, eye pain. EARS, NOSE, THROAT: Denies tinnitus, ear pain, hearing loss. RESPIRATORY: Denies cough, wheeze, shortness of breath.  CARDIOVASCULAR: Denies chest pain, palpitations, edema.  GASTROINTESTINAL: Denies nausea, vomiting, diarrhea, abdominal pain. Denies bright red blood per rectum. GENITOURINARY: Denies dysuria, hematuria. ENDOCRINE: Denies nocturia or thyroid problems. HEMATOLOGIC AND LYMPHATIC: Denies easy bruising or bleeding. SKIN: Denies rash or lesion. MUSCULOSKELETAL: Denies pain in neck, back, shoulder, knees, hips or arthritic symptoms.  NEUROLOGIC: Denies paralysis, paresthesias.History of stroke with the left upper and lower extremity weakness   PSYCHIATRIC: Denies anxiety or depressive symptoms.   VITAL SIGNS:  Blood pressure 127/71, pulse 60, temperature 98.7 F (37.1 C), temperature source Oral, resp. rate 16, height 6\' 3"  (1.905 m), weight 94.3 kg (208 lb), SpO2 98 %.  I/O:    Intake/Output Summary (Last 24 hours) at 04/13/2017 1229 Last data filed at 04/13/2017 1037 Gross per 24 hour  Intake 840 ml  Output -  Net 840 ml    PHYSICAL EXAMINATION:  GENERAL:  77 y.o.-year-old patient lying in the bed with no acute distress.  EYES: Pupils equal, round, reactive to light and accommodation. No scleral icterus. Extraocular muscles intact.  HEENT: Head atraumatic, normocephalic. Oropharynx and nasopharynx clear.  NECK:  Supple, no jugular venous distention. No thyroid enlargement, no tenderness.  LUNGS: Normal breath sounds bilaterally, no wheezing, rales,rhonchi or crepitation. No use of accessory muscles of respiration.  CARDIOVASCULAR: S1, S2 normal. No murmurs, rubs, or gallops.  ABDOMEN: Soft, non-tender, non-distended. Bowel sounds present. No organomegaly or mass.  EXTREMITIES: No pedal edema, cyanosis, or clubbing.  NEUROLOGIC: Cranial nerves II through XII are intact. Chronic upper and lower extremity weakness PSYCHIATRIC: The patient is alert and oriented x 3.  SKIN: No obvious rash, lesion, or ulcer.   DATA REVIEW:   CBC Recent Labs  Lab 04/12/17 0644  WBC 10.0  HGB 12.6*  HCT 36.8*  PLT 206    Chemistries  Recent Labs  Lab 04/11/17 0712  NA 134*  K 4.1  CL 101  CO2 24  GLUCOSE 169*  BUN 11  CREATININE 0.63  CALCIUM 8.8*    Cardiac Enzymes Recent Labs  Lab 04/10/17 1816  TROPONINI 0.03*    Microbiology Results  No results found for this or any previous visit.  RADIOLOGY:  No results found.  EKG:   Orders placed or performed during the hospital encounter of 05/22/16  . ED EKG  . ED EKG  . EKG 12-Lead  . EKG 12-Lead  . EKG      Management plans discussed with the patient,  family and they are in agreement.  CODE STATUS:     Code Status Orders  (From admission, onward)        Start     Ordered   04/11/17 0515  Full code  Continuous     04/11/17 0514    Code Status History    Date Active Date Inactive Code Status Order ID Comments User Context   This patient has a current code status but no historical code status.      TOTAL TIME TAKING CARE OF  THIS PATIENT: 45  minutes.   Note: This dictation was prepared with Dragon dictation along with smaller phrase technology. Any transcriptional errors that result from this process are unintentional.   @[facility] @  on 04/13/2017 at 12:29 PM  Between 7am to 6pm - Pager - [phone]  After 6pm go to [email or web address] - password [email or web address] [facility]  [place]  Hospitalists  Office  [phone]  CC: Primary care physician; Mickey Farber, MD

## 2017-04-13 NOTE — Care Management Important Message (Signed)
Important Message  Patient Details  Name: Oscar Brock MRN: [id number] Date of Birth: 13-May-1940   Medicare Important Message Given:  Yes Signed IM notice given   Eber Hong, RN 04/13/2017, 10:11 AM

## 2017-04-13 NOTE — Progress Notes (Signed)
Patient discharging to SNF. PAcket prepared by [employer]. Assessment as charted; no distress. Family at bedside. Report called to [facility].

## 2017-04-13 NOTE — Discharge Instructions (Addendum)
FOLLOW-UP WITH PRIMARY CARE PHYSICIAN IN 5-7 DAYS AT THE FACILITY fOLLOW-UP WITH NEUROLOGY AS SCHEDULED OR IN A WEEK

## 2017-04-13 NOTE — Progress Notes (Signed)
Pt alert and oriented with some confusion at times. C/o headache with relief with tylenol. No episodes of hypoglycemia noted. Continue to monitor

## 2017-04-24 DIAGNOSIS — N400 Enlarged prostate without lower urinary tract symptoms: Secondary | ICD-10-CM | POA: Insufficient documentation

## 2017-04-24 DIAGNOSIS — G4730 Sleep apnea, unspecified: Secondary | ICD-10-CM | POA: Insufficient documentation

## 2017-04-24 DIAGNOSIS — M1990 Unspecified osteoarthritis, unspecified site: Secondary | ICD-10-CM | POA: Insufficient documentation

## 2017-04-25 DIAGNOSIS — I69954 Hemiplegia and hemiparesis following unspecified cerebrovascular disease affecting left non-dominant side: Secondary | ICD-10-CM | POA: Insufficient documentation

## 2017-04-25 DIAGNOSIS — G8194 Hemiplegia, unspecified affecting left nondominant side: Secondary | ICD-10-CM | POA: Insufficient documentation

## 2017-04-25 HISTORY — DX: Hemiplegia, unspecified affecting left nondominant side: G81.94

## 2017-04-27 ENCOUNTER — Ambulatory Visit: Payer: Self-pay | Admitting: Surgery

## 2017-05-27 ENCOUNTER — Emergency Department: Payer: Medicare Other

## 2017-05-27 ENCOUNTER — Emergency Department
Admission: EM | Admit: 2017-05-27 | Discharge: 2017-05-27 | Disposition: A | Discharge status: Home / Self Care | Payer: Medicare Other | Attending: Emergency Medicine | Admitting: Emergency Medicine

## 2017-05-27 ENCOUNTER — Other Ambulatory Visit: Payer: Self-pay

## 2017-05-27 DIAGNOSIS — R51 Headache: Secondary | ICD-10-CM | POA: Diagnosis not present

## 2017-05-27 DIAGNOSIS — E038 Other specified hypothyroidism: Secondary | ICD-10-CM | POA: Insufficient documentation

## 2017-05-27 DIAGNOSIS — Z794 Long term (current) use of insulin: Secondary | ICD-10-CM | POA: Insufficient documentation

## 2017-05-27 DIAGNOSIS — Z7982 Long term (current) use of aspirin: Secondary | ICD-10-CM | POA: Insufficient documentation

## 2017-05-27 DIAGNOSIS — Z79899 Other long term (current) drug therapy: Secondary | ICD-10-CM | POA: Diagnosis not present

## 2017-05-27 DIAGNOSIS — J449 Chronic obstructive pulmonary disease, unspecified: Secondary | ICD-10-CM | POA: Insufficient documentation

## 2017-05-27 DIAGNOSIS — E1142 Type 2 diabetes mellitus with diabetic polyneuropathy: Secondary | ICD-10-CM | POA: Diagnosis not present

## 2017-05-27 DIAGNOSIS — Z7901 Long term (current) use of anticoagulants: Secondary | ICD-10-CM | POA: Insufficient documentation

## 2017-05-27 DIAGNOSIS — Z87891 Personal history of nicotine dependence: Secondary | ICD-10-CM | POA: Insufficient documentation

## 2017-05-27 DIAGNOSIS — I10 Essential (primary) hypertension: Secondary | ICD-10-CM | POA: Diagnosis not present

## 2017-05-27 DIAGNOSIS — Z950 Presence of cardiac pacemaker: Secondary | ICD-10-CM | POA: Diagnosis not present

## 2017-05-27 LAB — URINALYSIS, COMPLETE (UACMP) WITH MICROSCOPIC
Bacteria, UA: NONE SEEN
Bilirubin Urine: NEGATIVE
Glucose, UA: 50 mg/dL — AB
Hgb urine dipstick: NEGATIVE
KETONES UR: NEGATIVE mg/dL
Leukocytes, UA: NEGATIVE
Nitrite: NEGATIVE
PH: 7 (ref 5.0–8.0)
PROTEIN: NEGATIVE mg/dL
Specific Gravity, Urine: 1.011 (ref 1.005–1.030)
Squamous Epithelial / LPF: NONE SEEN

## 2017-05-27 LAB — CBC WITH DIFFERENTIAL/PLATELET
Basophils Absolute: 0 10*3/uL (ref 0–0.1)
Basophils Relative: 0 %
EOS ABS: 0.2 10*3/uL (ref 0–0.7)
EOS PCT: 2 %
HCT: 41.1 % (ref 40.0–52.0)
HEMOGLOBIN: 13.8 g/dL (ref 13.0–18.0)
LYMPHS ABS: 2.7 10*3/uL (ref 1.0–3.6)
Lymphocytes Relative: 27 %
MCH: 30.1 pg (ref 26.0–34.0)
MCHC: 33.7 g/dL (ref 32.0–36.0)
MCV: 89.4 fL (ref 80.0–100.0)
MONO ABS: 0.8 10*3/uL (ref 0.2–1.0)
MONOS PCT: 8 %
Neutro Abs: 6.3 10*3/uL (ref 1.4–6.5)
Neutrophils Relative %: 63 %
Platelets: 251 10*3/uL (ref 150–440)
RBC: 4.6 MIL/uL (ref 4.40–5.90)
RDW: 14.1 % (ref 11.5–14.5)
WBC: 10.1 10*3/uL (ref 3.8–10.6)

## 2017-05-27 LAB — BASIC METABOLIC PANEL
Anion gap: 7 (ref 5–15)
BUN: 8 mg/dL (ref 6–20)
CALCIUM: 9.1 mg/dL (ref 8.9–10.3)
CHLORIDE: 100 mmol/L — AB (ref 101–111)
CO2: 29 mmol/L (ref 22–32)
Creatinine, Ser: 0.71 mg/dL (ref 0.61–1.24)
GFR calc Af Amer: >60 mL/min (ref >60)
GFR calc non Af Amer: >60 mL/min (ref >60)
GLUCOSE: 197 mg/dL — AB (ref 65–99)
Potassium: 3.8 mmol/L (ref 3.5–5.1)
Sodium: 136 mmol/L (ref 135–145)

## 2017-05-27 MED ORDER — AMLODIPINE BESYLATE 5 MG PO TABS
2.5000 mg | ORAL_TABLET | Freq: Once | ORAL | Status: AC
  Administered 2017-05-27: 2.5 mg via ORAL
  Filled 2017-05-27: qty 1

## 2017-05-27 MED ORDER — AMLODIPINE BESYLATE 5 MG PO TABS: 2.5000 mg | ORAL_TABLET | Freq: Every day | ORAL | 0 refills | Status: DC

## 2017-05-27 NOTE — ED Provider Notes (Signed)
[facility] Emergency Department Provider Note  ____________________________________________  Time seen: Approximately 7:38 PM  I have reviewed the triage vital signs and the nursing notes.   HISTORY  Chief Complaint Hypertension and Headache  Level 5 Caveat: Portions of the History and Physical are unable to be obtained due to patient being a poor historian   HPI Oscar Brock is a 77 y.o. male who comes the ED for evaluation of hypertension. Blood pressure was as high as 200/90 earlier. He is been compliant with his medications. He takes Eliquis as well for chronic atrial fibrillation. He denies any acute symptoms. He has baseline chronic headache which is unchanged. No new vision changes paresthesias or weakness. He does have chronic left-sided weakness from a prior stroke.     Past Medical History:  Diagnosis Date  . Arthritis   . Atrial fibrillation (HCC)   . COPD (chronic obstructive pulmonary disease) (HCC)   . Diabetes mellitus without complication (HCC)   . Hypertension   . Stroke [facility])      Patient Active Problem List   Diagnosis Date Noted  . Benign prostatic hyperplasia 04/24/2017  . Degenerative arthritis 04/24/2017  . Sleep apnea 04/24/2017  . HTN (hypertension) 04/11/2017  . Diabetes (HCC) 04/11/2017  . History of stroke 04/11/2017  . COPD (chronic obstructive pulmonary disease) (HCC) 04/11/2017  . Sinoatrial node dysfunction (HCC) 04/11/2017  . Hypoglycemia 04/11/2017  . 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 Chronic idiopathic constipation 10/10/2016  . Diabetic polyneuropathy (HCC) 10/10/2016  . Difficulty walking 09/23/2016  . Tremor 07/12/2016  . Unstable gait 07/12/2016  . 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 Chronic anticoagulation 01/24/2014  . High risk medication use 10/16/2013  . Pure hypercholesterolemia 10/13/2013  .  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 Atherosclerotic heart disease of native coronary artery without angina pectoris 02/18/2009  . Gastro-esophageal reflux disease without esophagitis 02/18/2009  . Hyperlipidemia 02/18/2009  . Rheumatoid arthritis (HCC) 02/18/2009     Past Surgical History:  Procedure Laterality Date  . APPENDECTOMY    . HERNIA REPAIR    . PACEMAKER INSERTION    . PEG TUBE PLACEMENT       Prior to Admission medications   Medication Sig Start Date End Date Taking? Authorizing Provider  acetaminophen (TYLENOL) 500 MG tablet Take 500 mg by mouth every 6 (six) hours as needed.    [provider]  albuterol (PROVENTIL) (2.5 MG/3ML) 0.083% nebulizer solution 2.5 mg. 03/01/17   [provider]  amiodarone (PACERONE) 200 MG tablet Take 200 mg by mouth daily.    [provider]  amLODipine (NORVASC) 5 MG tablet Take 0.5 tablets (2.5 mg total) by mouth daily. 05/27/17   Sharman Cheek, MD  apixaban (ELIQUIS) 5 MG TABS tablet Take 5 mg by mouth 2 (two) times daily.    [provider]  aspirin EC 81 MG tablet Take by mouth.    [provider]  atorvastatin (LIPITOR) 40 MG tablet Place 1 tablet into feeding tube daily.  01/25/17   [provider]  bisacodyl (DULCOLAX) 5 MG EC tablet Take 5 mg by mouth.    [provider]  diphenhydrAMINE (BENADRYL) 25 mg capsule Take by mouth.    [provider]  docusate sodium (COLACE) 100 MG capsule Take 1 capsule (100 mg total) by mouth 2 (two) times daily as needed for mild constipation. 04/13/17  [name], MD  DULoxetine (CYMBALTA) 20 MG capsule Take 1 capsule (20 mg total) by mouth 2 (two) times daily. 04/13/17   [name], MD  famotidine (PEPCID) 20 MG tablet Place 20 mg into feeding tube daily.    [provider]  finasteride (PROSCAR) 5 MG tablet Take 1 tablet by mouth daily. 01/25/17   [provider]   folic acid (FOLVITE) 1 MG tablet Take 1 mg by mouth daily.    [provider]  gabapentin (NEURONTIN) 100 MG capsule Take 200 mg by mouth 3 (three) times daily. 01/26/17   [provider]  glucose blood (PRECISION QID TEST) test strip Use 1 each once daily. Use as instructed. 12/08/15   [provider]  insulin aspart (NOVOLOG) 100 UNIT/ML injection Inject 0-5 Units into the skin 3 (three) times daily with meals. CBG < 70: Implement Hypoglycemia Protocol. CBG 70 - 120 (dose in units): 0 CBG 121 - 150 (dose in units): 0 CBG 151 - 200 (dose in units): 1 CBG 201 - 250 (dose in units): 2 CBG 251 - 300 (dose in units): 3 CBG 301 - 350 (dose in units): 4 CBG 351 - 400 (dose in units): 5 04/13/17   Gouru, Aruna, MD  insulin regular (NOVOLIN R,HUMULIN R) 100 units/mL injection Inject into the skin. 03/01/17   [provider]  ipratropium (ATROVENT) 0.02 % nebulizer solution Take 0.5 mg by nebulization 4 (four) times daily.    [provider]  Lancets 28G MISC Use 1 each 2 (two) times daily. Use as instructed.    [provider]  levothyroxine (SYNTHROID, LEVOTHROID) 100 MCG tablet Place 1 tablet into feeding tube daily.  01/25/17   [provider]  losartan (COZAAR) 25 MG tablet Take 25 mg by mouth daily.    [provider]  magnesium oxide (MAG-OX) 400 MG tablet Take by mouth.    [provider]  Melatonin 3 MG CAPS Take 1 capsule by mouth at bedtime.    [provider]  Methotrexate Sodium (METHOTREXATE, PF,) 200 MG/8ML injection Inject 20 mg into the skin once a week. 04/11/16   [provider]  oxyCODONE (OXY IR/ROXICODONE) 5 MG immediate release tablet Take 1 tablet (5 mg total) by mouth every 4 (four) hours as needed for severe pain. 04/13/17   [name], MD  Polyethylene Glycol 3350 (PEG 3350) POWD Take by mouth.    [provider]  senna-docusate (SENOKOT-S) 8.6-50 MG tablet Take by  mouth.    [provider]  [facility] injection  04/06/17   [provider]  sodium chloride HYPERTONIC 3 % nebulizer solution Inhale 4 mL by nebulization 2 (two) times a day. 03/01/17   [provider]  traMADol (ULTRAM) 50 MG tablet Take 1 tablet (50 mg total) by mouth every 6 (six) hours as needed for moderate pain. 04/13/17   [name], MD  traZODone (DESYREL) 50 MG tablet 50 mg. 03/01/17   [provider]  warfarin (COUMADIN) 5 MG tablet Take by mouth. 04/11/16   [provider]     Allergies Effexor xr [venlafaxine hcl er]; Flovent hfa [fluticasone]; Lisinopril; Oxycodone-acetaminophen; Symbicort [budesonide-formoterol fumarate]; and Propoxyphene   Family History  Problem Relation Age of Onset  . Heart failure Mother   . Clotting disorder Mother   . Heart failure Father   . Hypertension Father   . Diabetes Father   . Stroke Brother   . Lung cancer Sister     Social  History Social History   Tobacco Use  . Smoking status: Former Smoker    Last attempt to quit: 1990    Years since quitting: 29.2  . Smokeless tobacco: Never Used  Substance Use Topics  . Alcohol use: No  . Drug use: No    Review of Systems  Constitutional:   No fever or chills.  Cardiovascular:   No chest pain or syncope. Respiratory:   No dyspnea or cough. Gastrointestinal:   Negative for abdominal pain, vomiting and diarrhea.  Musculoskeletal:   Negative for focal pain or swelling. Chronic left-sided weakness All other systems reviewed and are negative except as documented above in ROS and HPI.  ____________________________________________   PHYSICAL EXAM:  VITAL SIGNS: ED Triage Vitals  Enc Vitals Group     BP 05/27/17 1819 (!) 160/71     Pulse Rate 05/27/17 1819 71     Resp 05/27/17 1819 15     Temp 05/27/17 1819 97.7 F (36.5 C)     Temp Source 05/27/17 1819 Oral     SpO2 05/27/17 1819 100 %     Weight 05/27/17 1813 200 lb (90.7 kg)      Height 05/27/17 1813 6\' 3"  (1.905 m)     Head Circumference --      [facility] Flow --      Pain Score 05/27/17 1813 5     Pain Loc --      Pain Edu? --      Excl. in GC? --     Vital signs reviewed, nursing assessments reviewed.   Constitutional:   Alert and oriented. Well appearing and in no distress. Eyes:   No scleral icterus.  EOMI. No nystagmus. No conjunctival pallor. PERRL. ENT   Head:   Normocephalic and atraumatic.   Nose:   No congestion/rhinnorhea.    Mouth/Throat:   MMM, no pharyngeal erythema. No peritonsillar mass.    Neck:   No meningismus. Full ROM. Hematological/Lymphatic/Immunilogical:   No cervical lymphadenopathy. Cardiovascular:   RRR. Symmetric bilateral radial and DP pulses.  No murmurs.  Respiratory:   Normal respiratory effort without tachypnea/retractions. Breath sounds are clear and equal bilaterally. No wheezes/rales/rhonchi. Gastrointestinal:   Soft and nontender. Non distended. There is no CVA tenderness.  No rebound, rigidity, or guarding. Genitourinary:   deferred Musculoskeletal:   Normal range of motion in all extremities. No joint effusions.  No lower extremity tenderness.  No edema. Neurologic:   Normal speech and language. Tongue deviation to the left  3/5 strength in left upper extremity and left lower extremity Stroke scale 3, all chronic No acute focal neurologic deficits are appreciated.  Skin:    Skin is warm, dry and intact. No rash noted.  No petechiae, purpura, or bullae.  ____________________________________________    LABS (pertinent positives/negatives) (all labs ordered are listed, but only abnormal results are displayed) Labs Reviewed  URINE CULTURE - Abnormal; Notable for the following components:      Result Value   Culture MULTIPLE SPECIES PRESENT, SUGGEST RECOLLECTION (*)    All other components within normal limits  BASIC METABOLIC PANEL - Abnormal; Notable for the following components:   Chloride 100 (*)     Glucose, Bld 197 (*)    All other components within normal limits  URINALYSIS, COMPLETE (UACMP) WITH MICROSCOPIC - Abnormal; Notable for the following components:   Color, Urine YELLOW (*)    APPearance TURBID (*)    Glucose, UA 50 (*)    All other components within  normal limits  CBC WITH DIFFERENTIAL/PLATELET   ____________________________________________   EKG  interpreted by me Atrial paced sinus rhythm, rate of 73, normal axis and intervals. Normal QRS ST segments in 2 years. 2 PVCs on the strip.  ____________________________________________    RADIOLOGY  No results found.  ____________________________________________   PROCEDURES Procedures  ____________________________________________    CLINICAL IMPRESSION / ASSESSMENT AND PLAN / ED COURSE  Pertinent labs & imaging results that were available during my care of the patient were reviewed by me and considered in my medical decision making (see chart for details).   patient well-appearing no acute distress, comes to the ED without any acute complaints for evaluation of uncontrolled hypertension. On arrival to the ED his blood pressure is stable at 160/70.  I'll check EKG, labs, CT head to evaluate for spontaneous intracranial hemorrhage. If workup is reassuring, also the patient on low-dose amlodipine for better blood pressure control until he can follow up with his doctor. Clinical Course as of Jun 02 913  Sat May 27, 2017  2100 Labs unremarkable. CT without evidence of acute ICH or stroke. Plan to DC home on low dose amlodipine.    [PS]    Clinical Course User Index [PS] Sharman Cheek, MD     ----------------------------------------- 9:16 AM on 06/02/2017 -----------------------------------------  Late entry progress note. CT head negative for acute intracranial hemorrhage or stroke. Blood pressure spontaneously improved to stage II hypertension range in the ED. Patient discharged on amlodipine 2.5  mg daily follow up with primary care.  ____________________________________________   FINAL CLINICAL IMPRESSION(S) / ED DIAGNOSES    Final diagnoses:  Hypertension, unspecified type     ED Discharge Orders        Ordered    amLODipine (NORVASC) 5 MG tablet  Daily     05/27/17 2145      Portions of this note were generated with dragon dictation software. Dictation errors may occur despite best attempts at proofreading.    Sharman Cheek, MD 06/02/17 [id number]

## 2017-05-27 NOTE — Discharge Instructions (Signed)
your lab tests and CT scan of the head were unremarkable today. Your EKG also did not show any signs of heart attack. Start amlodipine to help control your high blood pressure and follow up with your doctor for continued monitoring.

## 2017-05-27 NOTE — ED Triage Notes (Signed)
Pt comes via [facility] with c/o hypertension and headache. EMS states initial BP 200/90, second reading 179/110. Pt is alert and oriented. BS-210, 96% room air. EKG paced rhythm per EMS. Pt states he did take his BP medication today. Pt appears in NAD at this time.

## 2017-05-27 NOTE — ED Notes (Signed)
Secretary notified to call EMS for pt transport home 

## 2017-05-27 NOTE — ED Notes (Signed)
Assisted patient with urinal. Pt cleaned up and repositioned in bed

## 2017-05-29 LAB — URINE CULTURE

## 2017-08-11 DIAGNOSIS — Z931 Gastrostomy status: Secondary | ICD-10-CM | POA: Insufficient documentation

## 2017-08-21 ENCOUNTER — Other Ambulatory Visit: Payer: Self-pay

## 2017-08-22 ENCOUNTER — Ambulatory Visit (INDEPENDENT_AMBULATORY_CARE_PROVIDER_SITE_OTHER): Payer: Medicare Other | Admitting: Surgery

## 2017-08-22 ENCOUNTER — Encounter: Payer: Self-pay | Admitting: Surgery

## 2017-08-22 VITALS — BP 113/68 | HR 69 | Temp 98.0°F | Ht 75.0 in

## 2017-08-22 DIAGNOSIS — K9420 Gastrostomy complication, unspecified: Secondary | ICD-10-CM | POA: Diagnosis not present

## 2017-08-22 NOTE — Progress Notes (Signed)
Surgical Clinic History and Physical  Referring provider:  Mickey Farber, MD [address] [facility] [place], [place] [address]  HISTORY OF PRESENT ILLNESS (HPI):  77 y.o. 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 He and his family report he has not used his gastrostomy feeding tube for nutrition x 5 months, though he does describe frequent choking with his saliva when it "goes down the wrong pipe" and says he needs to chew his food well and eat slowly to minimize similar to above symptoms with eating/drinking. Patient's family state they continue to flush his tube as previously instructed and express concern about leaking of non-purulent clear yellowish fluid around his gastrostomy feeding tube with mild Left of gastrostomy mild skin irritation, black mold/mildew in the tube despite attempts to clean his tube, and that he bumps into his gastrostomy feeding tube during his activities. Patient also states it is difficult for him to follow-up at [facility] due to transportation challenges. Patient reports that he otherwise continues to pass +flatus and non-bloody BM's WNL, denies any fever/chills, N/V, CP, or SOB.  PAST MEDICAL HISTORY (PMH):  Past Medical History:  Diagnosis Date  . Arthritis   . Atrial fibrillation (HCC)   . 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 COPD (chronic obstructive pulmonary disease) (HCC)   . Diabetes mellitus without complication (HCC)   . HTN (hypertension) 04/11/2017  . Hyperlipidemia 02/18/2009  . Hypertension   . 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 Stroke (HCC)   . Tremor 07/12/2016     PAST SURGICAL HISTORY [facility]):  Past Surgical History:  Procedure Laterality Date  . APPENDECTOMY    . HERNIA REPAIR    . PACEMAKER INSERTION    . PEG TUBE PLACEMENT       MEDICATIONS:   Prior to Admission medications   Medication Sig Start Date End Date Taking? Authorizing Provider  acetaminophen (TYLENOL) 500 MG tablet Take 500 mg by mouth every 6 (six) hours as needed.   Yes [provider]  albuterol (PROVENTIL) (2.5 MG/3ML) 0.083% nebulizer solution 2.5 mg. 03/01/17  Yes [provider]  ALPRAZolam Prudy Feeler) 0.25 MG tablet Take by mouth. 08/01/17  Yes [provider]  alum & mag hydroxide-simeth (ALMACONE DOUBLE STRENGTH) 400-400-40 MG/5ML suspension Take by mouth.   Yes [provider]  amiodarone (PACERONE) 200 MG tablet Take 200 mg by mouth daily.   Yes [provider]  amLODipine (NORVASC) 5 MG tablet Take 0.5 tablets (2.5 mg total) by mouth daily. 05/27/17  Yes Sharman Cheek, MD  apixaban (ELIQUIS) 5 MG TABS tablet Take 5 mg by mouth 2 (two) times daily.   Yes [provider]  Artificial Saliva (BIOTENE DRY MOUTH MOISTURIZING) SOLN Take by mouth. 03/01/17  Yes [provider]  aspirin EC 81 MG tablet Take by mouth.   Yes [provider]  atorvastatin (LIPITOR) 40 MG tablet Place 1 tablet into feeding tube daily.  01/25/17  Yes [provider]  bisacodyl (DULCOLAX) 5 MG EC tablet Take 5 mg by mouth.   Yes [provider]  diclofenac sodium (VOLTAREN) 1 % GEL Apply topically. 08/11/17 08/11/18 Yes [provider]  diphenhydrAMINE (BENADRYL) 25 mg capsule Take by mouth.   Yes [provider]  diphenhydramine-acetaminophen (TYLENOL PM) 25-500 MG TABS tablet Take by mouth.   Yes [provider]  docusate sodium (COLACE) 100 MG capsule Take 1 capsule (100 mg total) by mouth 2 (two) times daily as needed for mild constipation. 04/13/17  Yes Gouru, Deanna Artis, MD  DULoxetine (CYMBALTA) 20 MG capsule Take 1 capsule (20 mg total) by mouth 2 (two) times daily. 04/13/17  Yes Gouru, Deanna Artis, MD  famotidine (PEPCID) 20 MG tablet Place 20 mg into feeding tube daily.   Yes  [provider]  famotidine-calcium carbonate-magnesium hydroxide (PEPCID COMPLETE) 10-800-165 MG chewable tablet Chew by mouth.   Yes [provider]  finasteride (PROSCAR) 5 MG tablet Take 1 tablet by mouth daily. 01/25/17  Yes [provider]  folic acid (FOLVITE) 1 MG tablet Take 1 mg by mouth daily.   Yes [provider]  gabapentin (NEURONTIN) 100 MG capsule Take 200 mg by mouth 3 (three) times daily. 01/26/17  Yes [provider]  gabapentin (NEURONTIN) 300 MG capsule  07/27/17  Yes [provider]  glucose blood (PRECISION QID TEST) test strip Use 1 each once daily. Use as instructed. 12/08/15  Yes [provider]  HUMALOG KWIKPEN 100 UNIT/ML KiwkPen  05/25/17  Yes [provider]  insulin aspart (NOVOLOG) 100 UNIT/ML injection Inject 0-5 Units into the skin 3 (three) times daily with meals. CBG < 70: Implement Hypoglycemia Protocol. CBG 70 - 120 (dose in units): 0 CBG 121 - 150 (dose in units): 0 CBG 151 - 200 (dose in units): 1 CBG 201 - 250 (dose in units): 2 CBG 251 - 300 (dose in units): 3 CBG 301 - 350 (dose in units): 4 CBG 351 - 400 (dose in units): 5 04/13/17  Yes Gouru, Aruna, MD  Insulin Detemir (LEVEMIR FLEXTOUCH) 100 UNIT/ML Pen Inject into the skin. 05/25/17  Yes [provider]  insulin lispro (HUMALOG KWIKPEN) 100 UNIT/ML KiwkPen Inject into the skin. 06/08/17  Yes [provider]  insulin regular (NOVOLIN R,HUMULIN R) 100 units/mL injection Inject into the skin. 03/01/17  Yes [provider]  ipratropium (ATROVENT) 0.02 % nebulizer solution Take 0.5 mg by nebulization 4 (four) times daily.   Yes [provider]  Lancets 28G MISC Use 1 each 2 (two) times daily. Use as instructed.   Yes [provider]  LEVEMIR FLEXTOUCH 100 UNIT/ML Pen  05/25/17  Yes [provider]  levothyroxine (SYNTHROID, LEVOTHROID) 100 MCG tablet Place 1 tablet into feeding tube  daily.  01/25/17  Yes [provider]  losartan (COZAAR) 25 MG tablet Take 25 mg by mouth daily.   Yes [provider]  magnesium oxide (MAG-OX) 400 MG tablet Take by mouth.   Yes [provider]  Melatonin 3 MG CAPS Take 1 capsule by mouth at bedtime.   Yes [provider]  Methotrexate Sodium (METHOTREXATE, PF,) 200 MG/8ML injection Inject 20 mg into the skin once a week. 04/11/16  Yes [provider]  mirtazapine (REMERON) 7.5 MG tablet Take by mouth. 08/11/17 08/11/18 Yes [provider]  mirtazapine (REMERON) 7.5 MG tablet  08/11/17  Yes [provider]  omeprazole (PRILOSEC) 20 MG capsule Take by mouth. 05/25/17  Yes [provider]  omeprazole (PRILOSEC) 20 MG capsule  05/25/17  Yes [provider]  oxyCODONE (OXY IR/ROXICODONE) 5 MG immediate release tablet Take 1 tablet (5 mg total) by mouth every 4 (four) hours as needed for severe pain. 04/13/17  Yes Gouru, Deanna Artis, MD  Polyethylene Glycol 3350 (PEG 3350) POWD Take by mouth.   Yes [provider]  senna-docusate (SENOKOT-S) 8.6-50  MG tablet Take by mouth.   Yes [provider]  [facility] injection  04/06/17  Yes [provider]  sodium chloride HYPERTONIC 3 % nebulizer solution Inhale 4 mL by nebulization 2 (two) times a day. 03/01/17  Yes [provider]  traMADol (ULTRAM) 50 MG tablet Take 1 tablet (50 mg total) by mouth every 6 (six) hours as needed for moderate pain. 04/13/17  Yes Gouru, Deanna Artis, MD  traZODone (DESYREL) 50 MG tablet 50 mg. 03/01/17  Yes [provider]  warfarin (COUMADIN) 5 MG tablet Take by mouth. 04/11/16  Yes [provider]     ALLERGIES:  Allergies  Allergen Reactions  . Effexor Xr [Venlafaxine Hcl Er]   . Lisinopril   . Other Other (See Comments)    Jitters/nervous  . Oxycodone-Acetaminophen   . Budesonide-Formoterol Fumarate     Other reaction(s): Other (See Comments) hoarseness   . Fluticasone     Other reaction(s): Other (See Comments) hoarseness  . Propoxyphene Other (See Comments)    Jittery/nervous Jitters/nervous      SOCIAL HISTORY:  Social History   Socioeconomic History  . Marital status: Married    Spouse name: Not on file  . Number of children: Not on file  . Years of education: Not on file  . Highest education level: Not on file  Occupational History  . Not on file  Social Needs  . Financial resource strain: Not hard at all  . Food insecurity:    Worry: Patient refused    Inability: Patient refused  . Transportation needs:    Medical: Patient refused    Non-medical: Patient refused  Tobacco Use  . Smoking status: Former Smoker    Last attempt to quit: 1990    Years since quitting: 29.4  . Smokeless tobacco: Never Used  Substance and Sexual Activity  . Alcohol use: No  . Drug use: No  . Sexual activity: Not Currently  Lifestyle  . Physical activity:    Days per week: Patient refused    Minutes per session: Patient refused  . Stress: Only a little  Relationships  . Social connections:    Talks on phone: Patient refused    Gets together: Patient refused    Attends religious service: Patient refused    Active member of club or organization: Patient refused    Attends meetings of clubs or organizations: Patient refused    Relationship status: Patient refused  . Intimate partner violence:    Fear of current or ex partner: Patient refused    Emotionally abused: Patient refused    Physically abused: Patient refused    Forced sexual activity: Patient refused  Other Topics Concern  . Not on file  Social History Narrative  . Not on file    The patient currently resides (home / rehab facility / nursing home): Home The patient normally is (ambulatory / bedbound): Wheelchair-dependent  FAMILY HISTORY:  Family History  Problem Relation Age of Onset  . Heart failure Mother   . Clotting disorder Mother   . Heart failure Father    . Hypertension Father   . Diabetes Father   . Stroke Brother   . Lung cancer Sister     Otherwise negative/non-contributory.  REVIEW OF SYSTEMS:  Constitutional: denies any other weight loss, fever, chills, or sweats  Eyes: denies any other vision changes, history of eye injury  ENT: denies sore throat, hearing problems  Respiratory: denies shortness of breath, wheezing  Cardiovascular: denies chest pain, palpitations  Gastrointestinal: abdominal pain, N/V, and bowel function as per HPI Musculoskeletal: denies any other joint pains or cramps  Skin: Denies any other rashes or skin discolorations except as per HPI Neurological: denies any other headache, dizziness, weakness  Psychiatric: Denies any other depression, anxiety   All other review of systems were otherwise negative   VITAL SIGNS:  BP 113/68   Pulse 69   Temp 98 F (36.7 C) (Oral)   Ht 6\' 3"  (1.905 m)   BMI 25.00 kg/m      PHYSICAL EXAM:  Constitutional:  -- Normal body habitus  -- Awake, alert, and oriented x3  Eyes:  -- Pupils equally round and reactive to light  -- No scleral icterus  Ear, nose, throat:  -- No jugular venous distension -- No nasal drainage, bleeding Pulmonary:  -- No crackles  -- Equal breath sounds bilaterally -- Breathing non-labored at rest Cardiovascular:  -- S1, S2 present  -- No pericardial rubs  Gastrointestinal:  -- Abdomen soft, nontender, non-distended, no guarding/rebound tenderness with palpation -- Gastrostomy feeding tube with a small 1 - 2 mm gap surrounding the tubing entrance site with Left > Right peri-tube granulation tissue and what appears to be black/dark green particulate mold or mildew in the adapter end of the tubing itself, otherwise non-tender with no surrounding erythema or purulent drainage -- No abdominal masses appreciated, pulsatile or otherwise  Musculoskeletal and Integumentary:  -- Wounds or skin discoloration: None appreciated except as described  above (GI) -- Extremities: B/L UE and LE FROM, hands and feet warm  Neurologic:  -- Motor function: Intact and symmetric -- Sensation: Intact and symmetric  Labs:  CBC:  Lab Results  Component Value Date   WBC 10.1 05/27/2017   RBC 4.60 05/27/2017   BMP:  Lab Results  Component Value Date   GLUCOSE 197 (H) 05/27/2017   GLUCOSE 106 (H) 09/20/2011   CO2 29 05/27/2017   CO2 25 09/20/2011   BUN 8 05/27/2017   BUN 13 09/20/2011   CREATININE 0.71 05/27/2017   CREATININE 0.87 09/20/2011   CALCIUM 9.1 05/27/2017   CALCIUM 8.6 09/20/2011     Imaging studies: No new pertinent imaging studies available for review   Assessment/Plan:  77 y.o. male with skin irritation attributable to leakage of gastric secretions around gastrostomy feeding tube (placed by IR at [facility] 02/2017) and a small amount of what appears to be mold/mildew in patient's gastrostomy feeding tube, currently not being used for nutritional supplementation, but anticipated to likely be needed again, complicated by co-morbidities including DM, HTN, HLD, COPD, chronic Eliquis anticoagulation for atrial fibrillation with recent stroke in context also of carotid arterial stenosis, osteoarthritis, and limited mobility attributable in part to Left hemiplegia.   - no surgical intervention is indicated at this time  - velcro abdominal binder to protect PEG from "bumping" the tubing, etc  - apply gauze to tubing entrance site to protect skin from small-volume leaking gastric secretions  - patient and family agree it appears likely best to keep patient's gastrostomy feeding tube that patient seems likely to need again in the near future if able to maintain appropriate wound care and a better seal around gastrostomy tubing  - will refer to GI for possible replacement of PEG, including consideration for possible upsizing to improve seal, and patient may benefit from subsequent wound care evaluation  - return to clinic as needed, instructed  to call if any questions or concerns  All of the above recommendations were discussed  with the patient and patient's family, and all of patient's and family's questions were answered to their collectively expressed satisfaction.  Thank you for the opportunity to participate in this patient's care.  -- Scherrie Gerlach Earlene Plater, MD, RPVI [place]: [facility] General Surgery - Partnering for exceptional care. Office: [phone]

## 2017-08-22 NOTE — Patient Instructions (Signed)
We will send the referral to [facility]. Someone from their office will contact you to schedule an appointment. If you have not heard from anyone within 5 days please call our office and let us know so we can check on this for you.

## 2017-08-23 ENCOUNTER — Other Ambulatory Visit: Payer: Self-pay

## 2017-08-23 ENCOUNTER — Encounter: Payer: Self-pay | Admitting: Surgery

## 2017-08-23 DIAGNOSIS — Z431 Encounter for attention to gastrostomy: Secondary | ICD-10-CM

## 2017-08-28 ENCOUNTER — Telehealth: Payer: Self-pay

## 2017-08-28 ENCOUNTER — Telehealth: Payer: Self-pay | Admitting: Gastroenterology

## 2017-08-28 NOTE — Telephone Encounter (Signed)
Patients wife returned call regarding PEG Replacement.  I informed her that her husbands PEG replacement has been scheduled for tomorrow Do not eat or drink after midnight.  She said she may have to reschedule this due to transportation.  I asked her to call me back if she needed me to reschedule it.  Thanks [employer]

## 2017-08-28 NOTE — Telephone Encounter (Signed)
Patients wife called and needs to reschedule appt due to no transportation. She needs at least 3 days notice. Please call since it's tomorrow

## 2017-08-28 NOTE — Telephone Encounter (Signed)
Trish from endo called pt needs transportation and has to have 3 day notice please call pt to reschedule

## 2017-08-28 NOTE — Telephone Encounter (Signed)
Pt wife called to reschedule appointment

## 2017-08-29 ENCOUNTER — Encounter: Admission: RE | Payer: Self-pay | Source: Ambulatory Visit

## 2017-08-29 ENCOUNTER — Ambulatory Visit: Admission: RE | Admit: 2017-08-29 | Payer: Medicare Other | Source: Ambulatory Visit | Admitting: Gastroenterology

## 2017-08-29 ENCOUNTER — Other Ambulatory Visit: Payer: Self-pay

## 2017-08-29 DIAGNOSIS — Z431 Encounter for attention to gastrostomy: Secondary | ICD-10-CM

## 2017-08-29 SURGERY — REPLACEMENT, PEG TUBE, WITHOUT ENDOSCOPY
Anesthesia: General

## 2017-08-29 NOTE — Telephone Encounter (Signed)
Returned wife's call and rescheduled pt's peg tube replacement to 09/19/17 at [facility].

## 2017-09-18 ENCOUNTER — Encounter: Payer: Self-pay | Admitting: *Deleted

## 2017-09-19 ENCOUNTER — Encounter: Admission: RE | Payer: Self-pay | Source: Ambulatory Visit

## 2017-09-19 ENCOUNTER — Other Ambulatory Visit: Payer: Self-pay

## 2017-09-19 ENCOUNTER — Ambulatory Visit: Admission: RE | Admit: 2017-09-19 | Payer: Medicare Other | Source: Ambulatory Visit | Admitting: Gastroenterology

## 2017-09-19 SURGERY — INSERTION, PEG TUBE
Anesthesia: General

## 2017-09-20 ENCOUNTER — Other Ambulatory Visit: Payer: Self-pay

## 2017-09-20 DIAGNOSIS — Z431 Encounter for attention to gastrostomy: Secondary | ICD-10-CM

## 2017-09-22 ENCOUNTER — Telehealth: Payer: Self-pay

## 2017-09-22 NOTE — Telephone Encounter (Signed)
Patients wife has been informed to stop husbands Eliquis per Dr. Harrington Challenger " Patient may stop Eliquis 2-3 days  Prior to procedure.  Patient to restart 1 day after procedure".  Thanks [employer]

## 2017-09-26 ENCOUNTER — Ambulatory Visit: Payer: Medicare Other | Admitting: Certified Registered"

## 2017-09-26 ENCOUNTER — Ambulatory Visit
Admission: RE | Admit: 2017-09-26 | Discharge: 2017-09-26 | Disposition: A | Discharge status: Home / Self Care | Payer: Medicare Other | Source: Ambulatory Visit | Attending: Gastroenterology | Admitting: Gastroenterology

## 2017-09-26 ENCOUNTER — Encounter
Admission: RE | Disposition: A | Discharge status: Home / Self Care | Payer: Self-pay | Source: Ambulatory Visit | Attending: Gastroenterology

## 2017-09-26 DIAGNOSIS — K219 Gastro-esophageal reflux disease without esophagitis: Secondary | ICD-10-CM | POA: Diagnosis not present

## 2017-09-26 DIAGNOSIS — I4891 Unspecified atrial fibrillation: Secondary | ICD-10-CM | POA: Insufficient documentation

## 2017-09-26 DIAGNOSIS — Z79899 Other long term (current) drug therapy: Secondary | ICD-10-CM | POA: Insufficient documentation

## 2017-09-26 DIAGNOSIS — E119 Type 2 diabetes mellitus without complications: Secondary | ICD-10-CM | POA: Diagnosis not present

## 2017-09-26 DIAGNOSIS — I10 Essential (primary) hypertension: Secondary | ICD-10-CM | POA: Diagnosis not present

## 2017-09-26 DIAGNOSIS — Z431 Encounter for attention to gastrostomy: Secondary | ICD-10-CM

## 2017-09-26 DIAGNOSIS — F419 Anxiety disorder, unspecified: Secondary | ICD-10-CM | POA: Diagnosis not present

## 2017-09-26 DIAGNOSIS — Z87891 Personal history of nicotine dependence: Secondary | ICD-10-CM | POA: Insufficient documentation

## 2017-09-26 DIAGNOSIS — K9423 Gastrostomy malfunction: Secondary | ICD-10-CM

## 2017-09-26 DIAGNOSIS — Z7901 Long term (current) use of anticoagulants: Secondary | ICD-10-CM | POA: Diagnosis not present

## 2017-09-26 DIAGNOSIS — E785 Hyperlipidemia, unspecified: Secondary | ICD-10-CM | POA: Insufficient documentation

## 2017-09-26 DIAGNOSIS — Z794 Long term (current) use of insulin: Secondary | ICD-10-CM | POA: Insufficient documentation

## 2017-09-26 DIAGNOSIS — J449 Chronic obstructive pulmonary disease, unspecified: Secondary | ICD-10-CM | POA: Insufficient documentation

## 2017-09-26 HISTORY — DX: Gastro-esophageal reflux disease without esophagitis: K21.9

## 2017-09-26 HISTORY — DX: Anxiety disorder, unspecified: F41.9

## 2017-09-26 HISTORY — PX: PEG PLACEMENT: SHX5437

## 2017-09-26 SURGERY — INSERTION, PEG TUBE
Anesthesia: General

## 2017-09-26 MED ORDER — LIDOCAINE HCL (PF) 2 % IJ SOLN
INTRAMUSCULAR | Status: AC
  Filled 2017-09-26: qty 10

## 2017-09-26 MED ORDER — PROPOFOL 10 MG/ML IV BOLUS
INTRAVENOUS | Status: AC
  Filled 2017-09-26: qty 20

## 2017-09-26 MED ORDER — PROPOFOL 10 MG/ML IV BOLUS
INTRAVENOUS | Status: DC | PRN
  Administered 2017-09-26: 50 mg via INTRAVENOUS

## 2017-09-26 MED ORDER — SODIUM CHLORIDE 0.9 % IV SOLN: INTRAVENOUS | Status: DC

## 2017-09-26 MED ORDER — PROPOFOL 500 MG/50ML IV EMUL
INTRAVENOUS | Status: DC | PRN
  Administered 2017-09-26: 75 ug/kg/min via INTRAVENOUS

## 2017-09-26 MED ORDER — LIDOCAINE HCL (CARDIAC) PF 100 MG/5ML IV SOSY
PREFILLED_SYRINGE | INTRAVENOUS | Status: DC | PRN
  Administered 2017-09-26: 100 mg via INTRAVENOUS

## 2017-09-26 NOTE — Op Note (Signed)
[facility] Patient Name: Oscar Brock Procedure Date: 09/26/2017 11:10 AM MRN: [id number] Account #: [id number] Date of Birth: 1940/09/08 Admit Type: Outpatient Age: 77 Room: [facility] ENDO ROOM 4 Gender: Male Note Status: Finalized Procedure:            Upper GI endoscopy Indications:          Exchange PEG tube due to malfunctioning gastrostomy tube Providers:            Midge Minium MD, MD Referring MD:         Neomia Dear. Harrington Challenger, MD (Referring MD) Medicines:            Propofol per Anesthesia Complications:        No immediate complications. Procedure:            Pre-Anesthesia Assessment:                       - Prior to the procedure, a History and Physical was                        performed, and patient medications and allergies were                        reviewed. The patient's tolerance of previous                        anesthesia was also reviewed. The risks and benefits of                        the procedure and the sedation options and risks were                        discussed with the patient. All questions were                        answered, and informed consent was obtained. Prior                        Anticoagulants: The patient has taken Eliquis                        (apixaban). ASA Grade Assessment: III - A patient with                        severe systemic disease. After reviewing the risks and                        benefits, the patient was deemed in satisfactory                        condition to undergo the procedure.                       After obtaining informed consent, the endoscope was                        passed under direct vision. Throughout the procedure,                        the patient's blood pressure, pulse, and oxygen  saturations were monitored continuously. The Endoscope                        was introduced through the mouth, and advanced to the                        second part  of duodenum. The upper GI endoscopy was                        accomplished without difficulty. The patient tolerated                        the procedure well. Findings:      The esophagus was normal.      There was evidence of a gastrostomy present in the gastric body. The       opening to the PEG tube was narrowed and was dilated with a 22 [place]       dilator The PEG required removal because it was broken and was leaking.       The PEG was removed under endoscopic vision. Removal was easily       accomplished. An 18 Fr Bard gastrostomy tube was lubricated. The       replacement tube was placed using the existing gastrostomy port. The       final position of the gastrostomy tube was confirmed by relook       endoscopy, and skin marking noted to be 2 cm at the external bumper. The       final tension and compression of the abdominal wall by the PEG tube and       external bumper were checked and revealed that the bumper was loose and       lightly touching the skin. The tube was capped, and the tube site was       cleaned and dressed.      The examined duodenum was normal. Impression:           - Normal esophagus.                       - Gastrostomy present.                       - Normal examined duodenum.                       - The PEG was removed because it was broken and was                        leaking, and replaced with a PEG.                       - No specimens collected. Recommendation:       - Discharge patient to a nursing home.                       - Resume previous diet.                       - Continue present medications. Procedure Code(s):    --- Professional ---                       [id number],  Esophagogastroduodenoscopy, flexible, transoral;                        with directed placement of percutaneous gastrostomy tube Diagnosis Code(s):    --- Professional ---                       G86.76, Gastrostomy malfunction CPT copyright 2017 American Medical Association.  All rights reserved. The codes documented in this report are preliminary and upon coder review may  be revised to meet current compliance requirements. Midge Minium MD, MD 09/26/2017 11:27:47 AM This report has been signed electronically. Number of Addenda: 0 Note Initiated On: 09/26/2017 11:10 AM      [facility]

## 2017-09-26 NOTE — Anesthesia Postprocedure Evaluation (Signed)
Anesthesia Post Note  Patient: Oscar Brock  Procedure(s) Performed: PERCUTANEOUS ENDOSCOPIC GASTROSTOMY (PEG) PLACEMENT (N/A )  Patient location during evaluation: Endoscopy Anesthesia Type: General Level of consciousness: awake and alert Pain management: pain level controlled Vital Signs Assessment: post-procedure vital signs reviewed and stable Respiratory status: spontaneous breathing, nonlabored ventilation, respiratory function stable and patient connected to nasal cannula oxygen Cardiovascular status: blood pressure returned to baseline and stable Postop Assessment: no apparent nausea or vomiting Anesthetic complications: no     Last Vitals:  Vitals:   09/26/17 1136 09/26/17 1146  BP: 112/65 110/74  Pulse: 71 68  Resp: 15 17  Temp:    SpO2: 100% 97%    Last Pain:  Vitals:   09/26/17 1146  TempSrc:   PainSc: 0-No pain                 Lenard Simmer

## 2017-09-26 NOTE — Anesthesia Post-op Follow-up Note (Signed)
Anesthesia QCDR form completed.        

## 2017-09-26 NOTE — Transfer of Care (Signed)
Immediate Anesthesia Transfer of Care Note  Patient: Oscar Brock  Procedure(s) Performed: PERCUTANEOUS ENDOSCOPIC GASTROSTOMY (PEG) PLACEMENT (N/A )  Patient Location: PACU  Anesthesia Type:General  Level of Consciousness: sedated  Airway & Oxygen Therapy: Patient Spontanous Breathing and Patient connected to nasal cannula oxygen  Post-op Assessment: Report given to RN and Post -op Vital signs reviewed and stable  Post vital signs: Reviewed and stable  Last Vitals:  Vitals Value Taken Time  BP    Temp    Pulse    Resp    SpO2      Last Pain:  Vitals:   09/26/17 1126  TempSrc: Tympanic         Complications: No apparent anesthesia complications

## 2017-09-26 NOTE — H&P (Signed)
Oscar Minium, MD [facility] [address] [place], [place] [address] Phone:[phone] Fax : [phone]  Primary Care Physician:  Mickey Farber, MD Primary Gastroenterologist:  Dr. Servando Snare  Pre-Procedure History & Physical: HPI:  Oscar Brock is a 77 y.o. male is here for an endoscopy.   Past Medical History:  Diagnosis Date  . Anxiety   . Arthritis   . Atrial fibrillation (HCC)   . 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 COPD (chronic obstructive pulmonary disease) (HCC)   . Diabetes mellitus without complication (HCC)   . GERD (gastroesophageal reflux disease)   . HTN (hypertension) 04/11/2017  . Hyperlipidemia 02/18/2009  . Hypertension   . 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 Stroke (HCC)   . Tremor 07/12/2016    Past Surgical History:  Procedure Laterality Date  . APPENDECTOMY    . ESOPHAGOGASTRODUODENOSCOPY    . HERNIA REPAIR    . PACEMAKER INSERTION    . PEG TUBE PLACEMENT      Prior to Admission medications   Medication Sig Start Date End Date Taking? Authorizing Provider  acetaminophen (TYLENOL) 500 MG tablet Take 500 mg by mouth every 6 (six) hours as needed.   Yes [provider]  albuterol (PROVENTIL) (2.5 MG/3ML) 0.083% nebulizer solution 2.5 mg. 03/01/17  Yes [provider]  ALPRAZolam Prudy Feeler) 0.25 MG tablet Take by mouth. 08/01/17  Yes [provider]  alum & mag hydroxide-simeth (ALMACONE DOUBLE STRENGTH) 400-400-40 MG/5ML suspension Take by mouth.   Yes [provider]  amiodarone (PACERONE) 200 MG tablet Take 200 mg by mouth daily.   Yes [provider]  amLODipine (NORVASC) 5 MG tablet Take 0.5 tablets (2.5 mg total) by mouth daily. 05/27/17  Yes Sharman Cheek, MD  Artificial Saliva (BIOTENE DRY MOUTH MOISTURIZING) SOLN Take by mouth. 03/01/17  Yes [provider]  atorvastatin (LIPITOR) 40 MG tablet Place 1 tablet into feeding tube daily.  01/25/17  Yes [provider]  bisacodyl (DULCOLAX) 5 MG EC tablet Take 5 mg by mouth.   Yes [provider]  diphenhydrAMINE (BENADRYL) 25 mg capsule Take by mouth.   Yes [provider]  docusate sodium (COLACE) 100 MG capsule Take 1 capsule (100 mg total) by mouth 2 (two) times daily as needed for mild constipation. 04/13/17  Yes Gouru, Deanna Artis, MD  DULoxetine (CYMBALTA) 20 MG capsule Take 1 capsule (20 mg total) by mouth 2 (two) times daily. 04/13/17  Yes Gouru, Deanna Artis, MD  famotidine (PEPCID) 20 MG tablet Place 20 mg into feeding tube daily.   Yes [provider]  finasteride (PROSCAR) 5 MG tablet Take 1 tablet by mouth daily. 01/25/17  Yes [provider]  folic acid (FOLVITE) 1 MG tablet Take 1 mg by mouth daily.   Yes [provider]  gabapentin (NEURONTIN) 100 MG capsule Take 200 mg by mouth 3 (three) times daily. 01/26/17  Yes [provider]  gabapentin (NEURONTIN) 300 MG capsule  07/27/17  Yes [provider]  glucose blood (PRECISION QID TEST) test strip Use 1 each once daily. Use as instructed. 12/08/15  Yes [provider]  HUMALOG KWIKPEN 100 UNIT/ML KiwkPen  05/25/17  Yes [provider]  insulin aspart (NOVOLOG) 100 UNIT/ML injection Inject 0-5 Units into the skin 3 (three) times daily with meals. CBG < 70: Implement Hypoglycemia Protocol. CBG 70 - 120 (dose in units): 0 CBG 121 - 150 (dose in units): 0 CBG 151 - 200 (dose in  units): 1 CBG 201 - 250 (dose in units): 2 CBG 251 - 300 (dose in units): 3 CBG 301 - 350 (dose in units): 4 CBG 351 - 400 (dose in units): 5 04/13/17  Yes Gouru, Aruna, MD  Insulin Detemir (LEVEMIR FLEXTOUCH) 100 UNIT/ML Pen Inject into the skin. 05/25/17  Yes [provider]  insulin lispro (HUMALOG KWIKPEN) 100 UNIT/ML KiwkPen Inject into the skin. 06/08/17  Yes [provider]  insulin regular (NOVOLIN R,HUMULIN R) 100 units/mL injection Inject into the skin. 03/01/17  Yes  [provider]  ipratropium (ATROVENT) 0.02 % nebulizer solution Take 0.5 mg by nebulization 4 (four) times daily.   Yes [provider]  levothyroxine (SYNTHROID, LEVOTHROID) 100 MCG tablet Place 1 tablet into feeding tube daily.  01/25/17  Yes [provider]  losartan (COZAAR) 25 MG tablet Take 25 mg by mouth daily.   Yes [provider]  Melatonin 3 MG CAPS Take 1 capsule by mouth at bedtime.   Yes [provider]  Methotrexate Sodium (METHOTREXATE, PF,) 200 MG/8ML injection Inject 20 mg into the skin once a week. 04/11/16  Yes [provider]  mirtazapine (REMERON) 7.5 MG tablet Take by mouth. 08/11/17 08/11/18 Yes [provider]  mirtazapine (REMERON) 7.5 MG tablet  08/11/17  Yes [provider]  omeprazole (PRILOSEC) 20 MG capsule Take by mouth. 05/25/17  Yes [provider]  omeprazole (PRILOSEC) 20 MG capsule  05/25/17  Yes [provider]  Polyethylene Glycol 3350 (PEG 3350) POWD Take by mouth.   Yes [provider]  senna-docusate (SENOKOT-S) 8.6-50 MG tablet Take by mouth.   Yes [provider]  sodium chloride HYPERTONIC 3 % nebulizer solution Inhale 4 mL by nebulization 2 (two) times a day. 03/01/17  Yes [provider]  apixaban (ELIQUIS) 5 MG TABS tablet Take 5 mg by mouth 2 (two) times daily.    [provider]  aspirin EC 81 MG tablet Take by mouth.    [provider]  diclofenac sodium (VOLTAREN) 1 % GEL Apply topically. 08/11/17 08/11/18  [provider]  diphenhydramine-acetaminophen (TYLENOL PM) 25-500 MG TABS tablet Take by mouth.    [provider]  famotidine-calcium carbonate-magnesium hydroxide (PEPCID COMPLETE) 10-800-165 MG chewable tablet Chew by mouth.    [provider]  Lancets 28G MISC Use 1 each 2 (two) times daily. Use as instructed.    [provider]  LEVEMIR FLEXTOUCH 100 UNIT/ML Pen  05/25/17    [provider]  magnesium oxide (MAG-OX) 400 MG tablet Take by mouth.    [provider]  oxyCODONE (OXY IR/ROXICODONE) 5 MG immediate release tablet Take 1 tablet (5 mg total) by mouth every 4 (four) hours as needed for severe pain. 04/13/17   [name], MD  [facility] injection  04/06/17   [provider]  traMADol (ULTRAM) 50 MG tablet Take 1 tablet (50 mg total) by mouth every 6 (six) hours as needed for moderate pain. 04/13/17   [name], MD  traZODone (DESYREL) 50 MG tablet 50 mg. 03/01/17   [provider]  warfarin (COUMADIN) 5 MG tablet Take by mouth. 04/11/16   [provider]    Allergies as of 09/20/2017 - Review Complete 09/18/2017  Allergen Reaction Noted  . Effexor xr [venlafaxine hcl er]  05/22/2016  . Lisinopril  05/22/2016  . Other Other (See Comments) 07/13/2012  . Oxycodone-acetaminophen  02/13/2017  . Budesonide-formoterol fumarate  02/21/2014  . Fluticasone  02/21/2014  .  Propoxyphene Other (See Comments) 07/13/2012    Family History  Problem Relation Age of Onset  . Heart failure Mother   . Clotting disorder Mother   . Heart failure Father   . Hypertension Father   . Diabetes Father   . Stroke Brother   . Lung cancer Sister     Social History   Socioeconomic History  . Marital status: Married    Spouse name: Not on file  . Number of children: Not on file  . Years of education: Not on file  . Highest education level: Not on file  Occupational History  . Not on file  Social Needs  . Financial resource strain: Not hard at all  . Food insecurity:    Worry: Patient refused    Inability: Patient refused  . Transportation needs:    Medical: Patient refused    Non-medical: Patient refused  Tobacco Use  . Smoking status: Former Smoker    Last attempt to quit: 1990    Years since quitting: 29.5  . Smokeless tobacco: Never Used  Substance and Sexual Activity  . Alcohol use: No  . Drug use: No  . Sexual  activity: Not Currently  Lifestyle  . Physical activity:    Days per week: Patient refused    Minutes per session: Patient refused  . Stress: Only a little  Relationships  . Social connections:    Talks on phone: Patient refused    Gets together: Patient refused    Attends religious service: Patient refused    Active member of club or organization: Patient refused    Attends meetings of clubs or organizations: Patient refused    Relationship status: Patient refused  . Intimate partner violence:    Fear of current or ex partner: Patient refused    Emotionally abused: Patient refused    Physically abused: Patient refused    Forced sexual activity: Patient refused  Other Topics Concern  . Not on file  Social History Narrative  . Not on file    Review of Systems: See HPI, otherwise negative ROS  Physical Exam: There were no vitals taken for this visit. General:   Alert,  pleasant and cooperative in NAD Head:  Normocephalic and atraumatic. Neck:  Supple; no masses or thyromegaly. Lungs:  Clear throughout to auscultation.    Heart:  Regular rate and rhythm. Abdomen:  Soft, nontender and nondistended. Normal bowel sounds, without guarding, and without rebound.   Neurologic:  Alert and  oriented x4; left hemiparesis  Impression/Plan: Oscar Brock is here for an endoscopy to be performed for PEG tube dysfunction  Risks, benefits, limitations, and alternatives regarding  endoscopy have been reviewed with the patient.  Questions have been answered.  All parties agreeable.   Oscar Minium, MD  09/26/2017, 11:22 AM

## 2017-09-26 NOTE — Anesthesia Procedure Notes (Signed)
Performed by: Leng Montesdeoca, CRNA Pre-anesthesia Checklist: Patient identified, Emergency Drugs available, Suction available, Patient being monitored and Timeout performed Patient Re-evaluated:Patient Re-evaluated prior to induction Oxygen Delivery Method: Nasal cannula Induction Type: IV induction       

## 2017-09-26 NOTE — Anesthesia Preprocedure Evaluation (Signed)
Anesthesia Evaluation  Patient identified by MRN, date of birth, ID band Patient awake    Reviewed: Allergy & Precautions, H&P , NPO status , Patient's Chart, lab work & pertinent test results, reviewed documented beta blocker date and time   Airway Mallampati: I  TM Distance: >3 FB Neck ROM: full    Dental  (+) Upper Dentures, Lower Dentures, Edentulous Upper, Edentulous Lower, Dental Advidsory Given   Pulmonary neg shortness of breath, sleep apnea and Continuous Positive Airway Pressure Ventilation , COPD,  COPD inhaler, neg recent URI, former smoker,           Cardiovascular Exercise Tolerance: Good hypertension, (-) angina+ CAD, + Past MI and + Peripheral Vascular Disease  (-) Cardiac Stents and (-) CABG + dysrhythmias Atrial Fibrillation + pacemaker (-) Valvular Problems/Murmurs     Neuro/Psych neg Seizures PSYCHIATRIC DISORDERS Anxiety Depression CVA    GI/Hepatic Neg liver ROS, GERD  ,  Endo/Other  diabetesHypothyroidism   Renal/GU negative Renal ROS  negative genitourinary   Musculoskeletal   Abdominal   Peds  Hematology negative hematology ROS (+)   Anesthesia Other Findings Past Medical History: No date: Anxiety No date: Arthritis No date: Atrial fibrillation (HCC) 02/13/2017: Carotid stenosis, symptomatic, with infarction (HCC) No date: COPD (chronic obstructive pulmonary disease) (HCC) No date: Diabetes mellitus without complication (HCC) No date: GERD (gastroesophageal reflux disease) 04/11/2017: HTN (hypertension) 02/18/2009: Hyperlipidemia No date: Hypertension 04/25/2017: Left hemiplegia (HCC) No date: Stroke (HCC) 07/12/2016: Tremor   Reproductive/Obstetrics negative OB ROS                             Anesthesia Physical Anesthesia Plan  ASA: III  Anesthesia Plan: General   Post-op Pain Management:    Induction: Intravenous  PONV Risk Score and Plan: 2 and Propofol  infusion and TIVA  Airway Management Planned: Nasal Cannula  Additional Equipment:   Intra-op Plan:   Post-operative Plan:   Informed Consent: I have reviewed the patients History and Physical, chart, labs and discussed the procedure including the risks, benefits and alternatives for the proposed anesthesia with the patient or authorized representative who has indicated his/her understanding and acceptance.   Dental Advisory Given  Plan Discussed with: Anesthesiologist, CRNA and Surgeon  Anesthesia Plan Comments:         Anesthesia Quick Evaluation

## 2017-09-27 ENCOUNTER — Encounter: Payer: Self-pay | Admitting: Gastroenterology

## 2017-11-23 DIAGNOSIS — F419 Anxiety disorder, unspecified: Secondary | ICD-10-CM | POA: Insufficient documentation

## 2017-12-27 ENCOUNTER — Telehealth: Payer: Self-pay | Admitting: Gastroenterology

## 2017-12-27 NOTE — Telephone Encounter (Signed)
Pt wife left vm pt has been throwing up and feeling nauseas pt has peg-tube and it is stinking out of Stomach pt wife used to be able to push right back in but pt stattes it hurts please call

## 2017-12-28 ENCOUNTER — Encounter: Payer: Self-pay | Admitting: Gastroenterology

## 2017-12-28 ENCOUNTER — Ambulatory Visit (INDEPENDENT_AMBULATORY_CARE_PROVIDER_SITE_OTHER): Payer: Medicare Other | Admitting: Gastroenterology

## 2017-12-28 VITALS — BP 100/67 | HR 92 | Ht 75.0 in

## 2017-12-28 DIAGNOSIS — Z431 Encounter for attention to gastrostomy: Secondary | ICD-10-CM

## 2017-12-28 NOTE — Telephone Encounter (Signed)
Ginger , Bring him in will see him

## 2017-12-28 NOTE — Telephone Encounter (Signed)
Pt  Wife left vm  Her husband j tube is bleeding

## 2017-12-28 NOTE — Addendum Note (Signed)
Addended byRayann Heman E on: 12/28/2017 03:01 PM   Modules accepted: Orders

## 2017-12-28 NOTE — Progress Notes (Signed)
[name] MD, MRCP(U.K) [address]  [address]  [place], [place] [address]  Main: [phone]  Fax: [phone]   Primary Care Physician: Mickey Farber, MD  Primary Gastroenterologist:  Dr. [name]   No chief complaint on file.   HPI: Oscar Brock is a 77 y.o. male   He is here today for an urgent visit to have his PEG tube checked.  Phone call this am that the PEG site was bleeding and concern for it being dislodged.     Current Outpatient Medications  Medication Sig Dispense Refill  . acetaminophen (TYLENOL) 500 MG tablet Take 500 mg by mouth every 6 (six) hours as needed.    Marland Kitchen albuterol (PROVENTIL) (2.5 MG/3ML) 0.083% nebulizer solution 2.5 mg.    . ALPRAZolam (XANAX) 0.25 MG tablet Take by mouth.    Marland Kitchen alum & mag hydroxide-simeth (ALMACONE DOUBLE STRENGTH) 400-400-40 MG/5ML suspension Take by mouth.    Marland Kitchen amiodarone (PACERONE) 200 MG tablet Take 200 mg by mouth daily.    Marland Kitchen amLODipine (NORVASC) 5 MG tablet Take 0.5 tablets (2.5 mg total) by mouth daily. 30 tablet 0  . apixaban (ELIQUIS) 5 MG TABS tablet Take 5 mg by mouth 2 (two) times daily.    . Artificial Saliva (BIOTENE DRY MOUTH MOISTURIZING) SOLN Take by mouth.    Marland Kitchen aspirin EC 81 MG tablet Take by mouth.    Marland Kitchen atorvastatin (LIPITOR) 40 MG tablet Place 1 tablet into feeding tube daily.     . bisacodyl (DULCOLAX) 5 MG EC tablet Take 5 mg by mouth.    . diclofenac sodium (VOLTAREN) 1 % GEL Apply topically.    . diphenhydrAMINE (BENADRYL) 25 mg capsule Take by mouth.    . diphenhydramine-acetaminophen (TYLENOL PM) 25-500 MG TABS tablet Take by mouth.    . docusate sodium (COLACE) 100 MG capsule Take 1 capsule (100 mg total) by mouth 2 (two) times daily as needed for mild constipation. 10 capsule 0  . DULoxetine (CYMBALTA) 20 MG capsule Take 1 capsule (20 mg total) by mouth 2 (two) times daily. 60 capsule 1  . famotidine (PEPCID) 20 MG tablet Place 20 mg into feeding tube daily.    . famotidine-calcium  carbonate-magnesium hydroxide (PEPCID COMPLETE) 10-800-165 MG chewable tablet Chew by mouth.    . finasteride (PROSCAR) 5 MG tablet Take 1 tablet by mouth daily.    . folic acid (FOLVITE) 1 MG tablet Take 1 mg by mouth daily.    Marland Kitchen gabapentin (NEURONTIN) 100 MG capsule Take 200 mg by mouth 3 (three) times daily.    Marland Kitchen gabapentin (NEURONTIN) 300 MG capsule     . glucose blood (PRECISION QID TEST) test strip Use 1 each once daily. Use as instructed.    Marland Kitchen HUMALOG KWIKPEN 100 UNIT/ML KiwkPen     . insulin aspart (NOVOLOG) 100 UNIT/ML injection Inject 0-5 Units into the skin 3 (three) times daily with meals. CBG < 70: Implement Hypoglycemia Protocol. CBG 70 - 120 (dose in units): 0 CBG 121 - 150 (dose in units): 0 CBG 151 - 200 (dose in units): 1 CBG 201 - 250 (dose in units): 2 CBG 251 - 300 (dose in units): 3 CBG 301 - 350 (dose in units): 4 CBG 351 - 400 (dose in units): 5 10 mL 11  . Insulin Detemir (LEVEMIR FLEXTOUCH) 100 UNIT/ML Pen Inject into the skin.    Marland Kitchen insulin lispro (HUMALOG KWIKPEN) 100 UNIT/ML KiwkPen Inject into the skin.    Marland Kitchen insulin  regular (NOVOLIN R,HUMULIN R) 100 units/mL injection Inject into the skin.    Marland Kitchen ipratropium (ATROVENT) 0.02 % nebulizer solution Take 0.5 mg by nebulization 4 (four) times daily.    . Lancets 28G MISC Use 1 each 2 (two) times daily. Use as instructed.    Marland Kitchen LEVEMIR FLEXTOUCH 100 UNIT/ML Pen     . levothyroxine (SYNTHROID, LEVOTHROID) 100 MCG tablet Place 1 tablet into feeding tube daily.     Marland Kitchen losartan (COZAAR) 25 MG tablet Take 25 mg by mouth daily.    . magnesium oxide (MAG-OX) 400 MG tablet Take by mouth.    . Melatonin 3 MG CAPS Take 1 capsule by mouth at bedtime.    . Methotrexate Sodium (METHOTREXATE, PF,) 200 MG/8ML injection Inject 20 mg into the skin once a week.    . mirtazapine (REMERON) 7.5 MG tablet Take by mouth.    . mirtazapine (REMERON) 7.5 MG tablet     . omeprazole (PRILOSEC) 20 MG capsule Take by mouth.    Marland Kitchen omeprazole  (PRILOSEC) 20 MG capsule     . oxyCODONE (OXY IR/ROXICODONE) 5 MG immediate release tablet Take 1 tablet (5 mg total) by mouth every 4 (four) hours as needed for severe pain. 15 tablet 0  . Polyethylene Glycol 3350 (PEG 3350) POWD Take by mouth.    . senna-docusate (SENOKOT-S) 8.6-50 MG tablet Take by mouth.    Marland Kitchen SHINGRIX injection     . sodium chloride HYPERTONIC 3 % nebulizer solution Inhale 4 mL by nebulization 2 (two) times a day.    . traMADol (ULTRAM) 50 MG tablet Take 1 tablet (50 mg total) by mouth every 6 (six) hours as needed for moderate pain. 15 tablet 0  . traZODone (DESYREL) 50 MG tablet 50 mg.    . warfarin (COUMADIN) 5 MG tablet Take by mouth.     No current facility-administered medications for this visit.     Allergies as of 12/28/2017 - Review Complete 09/26/2017  Allergen Reaction Noted  . Effexor xr [venlafaxine hcl er]  05/22/2016  . Lisinopril  05/22/2016  . Other Other (See Comments) 07/13/2012  . Oxycodone-acetaminophen  02/13/2017  . Budesonide-formoterol fumarate  02/21/2014  . Fluticasone  02/21/2014  . Propoxyphene Other (See Comments) 07/13/2012    ROS:  General: Negative for anorexia, weight loss, fever, chills, fatigue, weakness. ENT: Negative for hoarseness, difficulty swallowing , nasal congestion. CV: Negative for chest pain, angina, palpitations, dyspnea on exertion, peripheral edema.  Respiratory: Negative for dyspnea at rest, dyspnea on exertion, cough, sputum, wheezing.  GI: See history of present illness. GU:  Negative for dysuria, hematuria, urinary incontinence, urinary frequency, nocturnal urination.  Endo: Negative for unusual weight change.    Physical Examination:   There were no vitals taken for this visit.  General: comfortable in a wheel chair with his wife Eyes: No icterus. Conjunctivae pink. Mouth: Oropharyngeal mucosa moist and pink , no lesions erythema or exudate. Lungs: Clear to auscultation bilaterally.  Non-labored. Heart: Regular rate and rhythm, no murmurs rubs or gallops.  Abdomen:PEG tube site looks clean , the bumper was at 6 cm mark, no active bleeding , can be easily rotated, fluid in the tube was clear, moved the bumper to 3 cm mark with 1 finger breath gap with abdominal wall, no pain or tenderness or erythema.  Neuro: Alert and oriented x 3.   Skin: Warm and dry, no jaundice.   Psych: Alert and cooperative, normal mood and affect.   Imaging Studies: No  results found.  Assessment and Plan:   Oscar Brock is a 77 y.o. y/o male here for a PEG tube check. Site looks good, the bumper was loose which was tightened. Redressed . No evidence of infection. Counseled family on aspects to look out for that suggest an infection fir the furture.     Dr [name]  MD,MRCP [facility]) Follow up in PRN

## 2017-12-28 NOTE — Telephone Encounter (Signed)
See messages below. Can I bring him in today to have you take a look at the tube? His wife thinks the balloon has deflated. She isn't sure though.

## 2017-12-28 NOTE — Telephone Encounter (Signed)
Contacted pt's wife and ok'd per Dr. Tobi Bastos for pt to be seen today at 2:00pm.

## 2017-12-28 NOTE — Telephone Encounter (Signed)
Have the tube looked at by any of our docs as soon as possible or in the ER if we cant see him.

## 2018-02-10 DIAGNOSIS — G4709 Other insomnia: Secondary | ICD-10-CM | POA: Insufficient documentation

## 2018-03-24 ENCOUNTER — Encounter: Payer: Self-pay | Admitting: Emergency Medicine

## 2018-03-24 DIAGNOSIS — I4891 Unspecified atrial fibrillation: Secondary | ICD-10-CM | POA: Insufficient documentation

## 2018-03-24 DIAGNOSIS — Z431 Encounter for attention to gastrostomy: Secondary | ICD-10-CM | POA: Diagnosis not present

## 2018-03-24 DIAGNOSIS — E119 Type 2 diabetes mellitus without complications: Secondary | ICD-10-CM | POA: Diagnosis not present

## 2018-03-24 DIAGNOSIS — I10 Essential (primary) hypertension: Secondary | ICD-10-CM | POA: Diagnosis not present

## 2018-03-24 DIAGNOSIS — J449 Chronic obstructive pulmonary disease, unspecified: Secondary | ICD-10-CM | POA: Insufficient documentation

## 2018-03-24 DIAGNOSIS — Z79899 Other long term (current) drug therapy: Secondary | ICD-10-CM | POA: Insufficient documentation

## 2018-03-24 NOTE — ED Triage Notes (Signed)
Patient reports that his feeding tube came out about 20:00 tonight.

## 2018-03-25 ENCOUNTER — Emergency Department
Admission: EM | Admit: 2018-03-25 | Discharge: 2018-03-25 | Disposition: A | Discharge status: Home / Self Care | Payer: Medicare Other | Attending: Student in an Organized Health Care Education/Training Program | Admitting: Student in an Organized Health Care Education/Training Program

## 2018-03-25 DIAGNOSIS — T85528A Displacement of other gastrointestinal prosthetic devices, implants and grafts, initial encounter: Secondary | ICD-10-CM

## 2018-03-25 DIAGNOSIS — Z434 Encounter for attention to other artificial openings of digestive tract: Secondary | ICD-10-CM

## 2018-03-25 NOTE — ED Notes (Signed)
ED Provider at bedside. 

## 2018-03-25 NOTE — Discharge Instructions (Addendum)
In most cases, when a G-tube is no longer needed, it can simply be removed. The site will slowly close on its own over a period of about two weeks. Seek medical care if you have any pain, fevers or for any additional questions or concerns.

## 2018-03-25 NOTE — ED Provider Notes (Signed)
[facility] Emergency Department Provider Note    First MD Initiated Contact with Patient 03/25/18 0231     (approximate)  I have reviewed the triage vital signs and the nursing notes.   HISTORY  Chief Complaint feeding tube    HPI Oscar Brock is a 78 y.o. male illicit past medical history with chronic G-tube no longer being used presents after G-tube was accidentally removed this evening.  Patient and family state that they do not want it replaced as he is not currently requiring the feeding tube and has been without needing it for nearly half a year.  States that they would prefer to be left out as it is getting in the way of his rehab and causing some discomfort.  Past Medical History:  Diagnosis Date  . Anxiety   . Arthritis   . Atrial fibrillation (HCC)   . 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 COPD (chronic obstructive pulmonary disease) (HCC)   . Diabetes mellitus without complication (HCC)   . GERD (gastroesophageal reflux disease)   . HTN (hypertension) 04/11/2017  . Hyperlipidemia 02/18/2009  . Hypertension   . 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 Stroke (HCC)   . Tremor 07/12/2016   Family History  Problem Relation Age of Onset  . Heart failure Mother   . Clotting disorder Mother   . Heart failure Father   . Hypertension Father   . Diabetes Father   . Stroke Brother   . Lung cancer Sister    Past Surgical History:  Procedure Laterality Date  . APPENDECTOMY    . ESOPHAGOGASTRODUODENOSCOPY    . HERNIA REPAIR    . PACEMAKER INSERTION    . PEG PLACEMENT N/A 09/26/2017   Procedure: PERCUTANEOUS ENDOSCOPIC GASTROSTOMY (PEG) PLACEMENT;  Surgeon: Midge MiniumWohl, Darren, MD;  Location: [facility] ENDOSCOPY;  Service: Endoscopy;  Laterality: N/A;  . PEG TUBE PLACEMENT     Patient Active Problem List   Diagnosis Date Noted  . Mechanical complication of gastrostomy (HCC)   . Gastrostomy tube in place (HCC) 08/11/2017  . Left  hemiplegia (HCC) 04/25/2017  . Benign prostatic hyperplasia 04/24/2017  . Degenerative arthritis 04/24/2017  . Sleep apnea 04/24/2017  . HTN (hypertension) 04/11/2017  . Diabetes (HCC) 04/11/2017  . History of stroke 04/11/2017  . COPD (chronic obstructive pulmonary disease) (HCC) 04/11/2017  . Sinoatrial node dysfunction (HCC) 04/11/2017  . Hypoglycemia 04/11/2017  . 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 Chronic idiopathic constipation 10/10/2016  . Diabetic polyneuropathy (HCC) 10/10/2016  . Difficulty walking 09/23/2016  . Tremor 07/12/2016  . Unstable gait 07/12/2016  . 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 Chronic anticoagulation 01/24/2014  . High risk medication use 10/16/2013  . Pure hypercholesterolemia 10/13/2013  . 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 Atherosclerotic heart disease of native coronary artery without angina pectoris 02/18/2009  . Gastro-esophageal reflux disease without esophagitis 02/18/2009  . Hyperlipidemia 02/18/2009  . Rheumatoid arthritis (HCC) 02/18/2009      Prior to Admission medications   Medication Sig Start Date End Date Taking? Authorizing Provider  acetaminophen (TYLENOL) 500 MG tablet Take 500 mg by mouth every 6 (six) hours as needed.    [provider]  albuterol (PROVENTIL) (2.5 MG/3ML) 0.083% nebulizer solution 2.5 mg. 03/01/17   [provider]  ALPRAZolam Prudy Feeler(XANAX) 0.25 MG tablet Take by mouth. 08/01/17   [provider]  alum & mag hydroxide-simeth (ALMACONE DOUBLE STRENGTH) 400-400-40 MG/5ML suspension Take by mouth.    [provider]  amiodarone (PACERONE) 200 MG tablet Take 200 mg by mouth daily.    [provider]  amLODipine (NORVASC) 5 MG tablet Take 0.5 tablets (2.5 mg total) by mouth daily. Patient not taking: Reported on 12/28/2017 05/27/17   Sharman CheekStafford,  Phillip, MD  apixaban (ELIQUIS) 5 MG TABS tablet Take 5 mg by mouth 2 (two) times daily.    [provider]  Artificial Saliva (BIOTENE DRY MOUTH MOISTURIZING) SOLN Take by mouth. 03/01/17   [provider]  aspirin EC 81 MG tablet Take by mouth.    [provider]  atorvastatin (LIPITOR) 40 MG tablet Place 1 tablet into feeding tube daily.  01/25/17   [provider]  bisacodyl (DULCOLAX) 5 MG EC tablet Take 5 mg by mouth.    [provider]  diclofenac sodium (VOLTAREN) 1 % GEL Apply topically. 08/11/17 08/11/18  [provider]  diphenhydrAMINE (BENADRYL) 25 mg capsule Take by mouth.    [provider]  diphenhydramine-acetaminophen (TYLENOL PM) 25-500 MG TABS tablet Take by mouth.    [provider]  docusate sodium (COLACE) 100 MG capsule Take 1 capsule (100 mg total) by mouth 2 (two) times daily as needed for mild constipation. 04/13/17   Gouru, Deanna ArtisAruna, MD  DULoxetine (CYMBALTA) 20 MG capsule Take 1 capsule (20 mg total) by mouth 2 (two) times daily. Patient not taking: Reported on 12/28/2017 04/13/17   Ramonita LabGouru, Aruna, MD  famotidine (PEPCID) 20 MG tablet Place 20 mg into feeding tube daily.    [provider]  famotidine-calcium carbonate-magnesium hydroxide (PEPCID COMPLETE) 10-800-165 MG chewable tablet Chew by mouth.    [provider]  finasteride (PROSCAR) 5 MG tablet Take 1 tablet by mouth daily. 01/25/17   [provider]  folic acid (FOLVITE) 1 MG tablet Take 1 mg by mouth daily.    [provider]  gabapentin (NEURONTIN) 100 MG capsule Take 200 mg by mouth 3 (three) times daily. 01/26/17   [provider]  gabapentin (NEURONTIN) 300 MG capsule  07/27/17   [provider]  glucose blood (PRECISION QID TEST) test strip Use 1 each once daily. Use as instructed. 12/08/15   [provider]  Catalina LungerHUMALOG KWIKPEN 100 UNIT/ML Ulice BrilliantKiwkPen  05/25/17   [provider]   insulin aspart (NOVOLOG) 100 UNIT/ML injection Inject 0-5 Units into the skin 3 (three) times daily with meals. CBG < 70: Implement Hypoglycemia Protocol. CBG 70 - 120 (dose in units): 0 CBG 121 - 150 (dose in units): 0 CBG 151 - 200 (dose in units): 1 CBG 201 - 250 (dose in units): 2 CBG 251 - 300 (dose in units): 3 CBG 301 - 350 (dose in units): 4 CBG 351 - 400 (dose in units): 5 Patient not taking: Reported on 12/28/2017 04/13/17   Ramonita LabGouru, Aruna, MD  Insulin Detemir (LEVEMIR FLEXTOUCH) 100 UNIT/ML Pen Inject into the skin. 05/25/17   [provider]  insulin lispro (HUMALOG KWIKPEN) 100 UNIT/ML KiwkPen Inject into the skin. 06/08/17   [provider]  insulin regular (NOVOLIN R,HUMULIN R) 100 units/mL injection Inject into the skin. 03/01/17   [provider]  ipratropium (ATROVENT) 0.02 % nebulizer solution Take 0.5 mg by nebulization 4 (four) times daily.    [provider]  Lancets 28G MISC Use 1 each 2 (two) times daily. Use as instructed.    [provider]  LEVEMIR FLEXTOUCH 100 UNIT/ML Pen  05/25/17   [provider]  levothyroxine (SYNTHROID, LEVOTHROID) 100 MCG tablet Place 1 tablet into feeding tube daily.  01/25/17   [provider]  losartan (COZAAR) 25 MG tablet Take 25 mg by mouth daily.    [provider]  magnesium oxide (MAG-OX) 400 MG tablet Take by mouth.    [provider]  Melatonin 3 MG CAPS Take 1 capsule by mouth at bedtime.    [provider]  Methotrexate Sodium (METHOTREXATE, PF,) 200 MG/8ML injection Inject 20 mg into the skin once a week. 04/11/16   [provider]  mirtazapine (REMERON) 7.5 MG tablet Take by mouth. 08/11/17 08/11/18  [provider]  omeprazole (PRILOSEC) 20 MG capsule Take by mouth. 05/25/17   [provider]  oxyCODONE (OXY IR/ROXICODONE) 5 MG immediate release tablet Take 1 tablet (5 mg total) by mouth every 4 (four) hours as needed  for severe pain. Patient not taking: Reported on 12/28/2017 04/13/17   [name], MD  Polyethylene Glycol 3350 (PEG 3350) POWD Take by mouth.    [provider]  senna-docusate (SENOKOT-S) 8.6-50 MG tablet Take by mouth.    [provider]  [facility] injection  04/06/17   [provider]  sodium chloride HYPERTONIC 3 % nebulizer solution Inhale 4 mL by nebulization 2 (two) times a day. 03/01/17   [provider]  traMADol (ULTRAM) 50 MG tablet Take 1 tablet (50 mg total) by mouth every 6 (six) hours as needed for moderate pain. 04/13/17   [name], MD  traZODone (DESYREL) 50 MG tablet 50 mg. 03/01/17   [provider]  warfarin (COUMADIN) 5 MG tablet Take by mouth. 04/11/16   [provider]    Allergies Effexor xr [venlafaxine hcl er]; Lisinopril; Other; Oxycodone-acetaminophen; Budesonide-formoterol fumarate; Fluticasone; and Propoxyphene    Social History Social History   Tobacco Use  . Smoking status: Former Smoker    Last attempt to quit: 1990    Years since quitting: 30.0  . Smokeless tobacco: Never Used  Substance Use Topics  . Alcohol use: No  . Drug use: No    Review of Systems Patient denies headaches, rhinorrhea, blurry vision, numbness, shortness of breath, chest pain, edema, cough, abdominal pain, nausea, vomiting, diarrhea, dysuria, fevers, rashes or hallucinations unless otherwise stated above in HPI. ____________________________________________   PHYSICAL EXAM:  VITAL SIGNS: Vitals:   03/24/18 2127  BP: (!) 122/93  Pulse: 94  Resp: 18  Temp: 98.5 F (36.9 C)  SpO2: 100%    Constitutional: Alert and oriented. Well appearing and in no acute distress. Eyes: Conjunctivae are normal.  Head: Atraumatic. Nose: No congestion/rhinnorhea. Mouth/Throat: Mucous membranes are moist.   Neck: Painless ROM.  Cardiovascular:   Good peripheral circulation. Respiratory: Normal respiratory effort.  No  retractions.  Gastrointestinal: Soft and nontender.  Small amount of irritation around previous G-tube insertion site.  No bleeding fluctuance or purulence. Musculoskeletal: No lower extremity tenderness .  No joint effusions. Neurologic:  Normal speech and language.  Skin:  Skin is warm, dry and intact. No rash noted. Psychiatric: Mood and affect are normal. Speech and behavior are normal.  ____________________________________________   LABS (all labs ordered are listed, but only abnormal results are displayed)  No results found for this or any previous visit (from the past 24 hour(s)). ____________________________________________  E____________________________________  RADIOLOGY   ____________________________________________   PROCEDURES  Procedure(s) performed:  Procedures    Critical Care performed: no  ____________________________________________   INITIAL IMPRESSION / ASSESSMENT AND PLAN / ED COURSE  Pertinent labs & imaging results that were available during my care of the patient were reviewed by me and considered in my medical decision making (see chart for details).  DDX: peg tube removal, displacement, abrasion  MEHRAN GUDERIAN is a 78 y.o. who presents to the ED with displaced G-tube.  Patient well-appearing.  Patient family adamantly want G-tube not to be reinserted.  Discussed risks and benefits including possible need for repeat procedure in the future if we are to let this ostomy close.  Patient and family demonstrated understanding of this.  He stable and appropriate for outpatient follow-up.      ____________________________________________   FINAL CLINICAL IMPRESSION(S) / ED DIAGNOSES  Final diagnoses:  Gastrojejunostomy tube dislodgement (HCC)      NEW MEDICATIONS STARTED DURING THIS VISIT:  New Prescriptions   No medications on file     Note:  This document was prepared using Dragon voice recognition software and may include  unintentional dictation errors.     Willy Eddy, MD 03/25/18 [phone]

## 2018-03-25 NOTE — ED Notes (Signed)
Dressing applied to site. Pt educated on how to care for site at home. Pt in NAD at this time.

## 2018-03-26 ENCOUNTER — Telehealth: Payer: Self-pay | Admitting: Gastroenterology

## 2018-03-26 NOTE — Telephone Encounter (Signed)
Patient's wife(Sonasha) called & states the went to the ED and patient's peg tube came out & they did not want it replaced. Please call & let her know what happens to the balloon.

## 2018-03-26 NOTE — Telephone Encounter (Signed)
Advised pt's wife the only way the peg tube would be able to come out that easily is if the internal balloon deflated. Advised her to watch the area for any redness or infection. She advised me that Mr Oscar Brock was doing well.

## 2018-04-05 DIAGNOSIS — R451 Restlessness and agitation: Secondary | ICD-10-CM | POA: Insufficient documentation

## 2019-05-15 ENCOUNTER — Emergency Department
Admission: EM | Admit: 2019-05-15 | Discharge: 2019-05-15 | Disposition: A | Discharge status: Home / Self Care | Payer: Medicare Other | Attending: Emergency Medicine | Admitting: Emergency Medicine

## 2019-05-15 ENCOUNTER — Ambulatory Visit (INDEPENDENT_AMBULATORY_CARE_PROVIDER_SITE_OTHER)
Admission: EM | Admit: 2019-05-15 | Discharge: 2019-05-15 | Disposition: A | Discharge status: Other Acute Inpatient Hospital | Payer: Medicare Other | Source: Home / Self Care | Attending: Family Medicine | Admitting: Family Medicine

## 2019-05-15 ENCOUNTER — Other Ambulatory Visit: Payer: Self-pay

## 2019-05-15 ENCOUNTER — Emergency Department: Payer: Medicare Other

## 2019-05-15 DIAGNOSIS — M898X1 Other specified disorders of bone, shoulder: Secondary | ICD-10-CM

## 2019-05-15 DIAGNOSIS — R079 Chest pain, unspecified: Secondary | ICD-10-CM | POA: Insufficient documentation

## 2019-05-15 DIAGNOSIS — Z950 Presence of cardiac pacemaker: Secondary | ICD-10-CM | POA: Insufficient documentation

## 2019-05-15 DIAGNOSIS — E039 Hypothyroidism, unspecified: Secondary | ICD-10-CM | POA: Diagnosis not present

## 2019-05-15 DIAGNOSIS — Z7901 Long term (current) use of anticoagulants: Secondary | ICD-10-CM | POA: Insufficient documentation

## 2019-05-15 DIAGNOSIS — Z87891 Personal history of nicotine dependence: Secondary | ICD-10-CM | POA: Insufficient documentation

## 2019-05-15 DIAGNOSIS — I2510 Atherosclerotic heart disease of native coronary artery without angina pectoris: Secondary | ICD-10-CM | POA: Insufficient documentation

## 2019-05-15 DIAGNOSIS — E119 Type 2 diabetes mellitus without complications: Secondary | ICD-10-CM | POA: Insufficient documentation

## 2019-05-15 DIAGNOSIS — Z931 Gastrostomy status: Secondary | ICD-10-CM | POA: Insufficient documentation

## 2019-05-15 DIAGNOSIS — Z794 Long term (current) use of insulin: Secondary | ICD-10-CM | POA: Insufficient documentation

## 2019-05-15 DIAGNOSIS — M25551 Pain in right hip: Secondary | ICD-10-CM | POA: Insufficient documentation

## 2019-05-15 DIAGNOSIS — R109 Unspecified abdominal pain: Secondary | ICD-10-CM | POA: Insufficient documentation

## 2019-05-15 DIAGNOSIS — Z79899 Other long term (current) drug therapy: Secondary | ICD-10-CM | POA: Insufficient documentation

## 2019-05-15 DIAGNOSIS — J449 Chronic obstructive pulmonary disease, unspecified: Secondary | ICD-10-CM | POA: Diagnosis not present

## 2019-05-15 DIAGNOSIS — R1033 Periumbilical pain: Secondary | ICD-10-CM

## 2019-05-15 DIAGNOSIS — I10 Essential (primary) hypertension: Secondary | ICD-10-CM | POA: Diagnosis not present

## 2019-05-15 HISTORY — DX: Calculus of kidney: N20.0

## 2019-05-15 LAB — COMPREHENSIVE METABOLIC PANEL
ALT: 28 U/L (ref 0–44)
AST: 33 U/L (ref 15–41)
Albumin: 4 g/dL (ref 3.5–5.0)
Alkaline Phosphatase: 61 U/L (ref 38–126)
Anion gap: 13 (ref 5–15)
BUN: 18 mg/dL (ref 8–23)
CO2: 20 mmol/L — ABNORMAL LOW (ref 22–32)
Calcium: 9.2 mg/dL (ref 8.9–10.3)
Chloride: 101 mmol/L (ref 98–111)
Creatinine, Ser: 0.88 mg/dL (ref 0.61–1.24)
GFR calc Af Amer: >60 mL/min (ref >60)
GFR calc non Af Amer: >60 mL/min (ref >60)
Glucose, Bld: 165 mg/dL — ABNORMAL HIGH (ref 70–99)
Potassium: 4.9 mmol/L (ref 3.5–5.1)
Sodium: 134 mmol/L — ABNORMAL LOW (ref 135–145)
Total Bilirubin: 1.1 mg/dL (ref 0.3–1.2)
Total Protein: 7.7 g/dL (ref 6.5–8.1)

## 2019-05-15 LAB — CBC WITH DIFFERENTIAL/PLATELET
Abs Immature Granulocytes: 0.03 10*3/uL (ref 0.00–0.07)
Basophils Absolute: 0.1 10*3/uL (ref 0.0–0.1)
Basophils Relative: 1 %
Eosinophils Absolute: 0.1 10*3/uL (ref 0.0–0.5)
Eosinophils Relative: 1 %
HCT: 46 % (ref 39.0–52.0)
Hemoglobin: 14.9 g/dL (ref 13.0–17.0)
Immature Granulocytes: 0 %
Lymphocytes Relative: 22 %
Lymphs Abs: 2.1 10*3/uL (ref 0.7–4.0)
MCH: 31.3 pg (ref 26.0–34.0)
MCHC: 32.4 g/dL (ref 30.0–36.0)
MCV: 96.6 fL (ref 80.0–100.0)
Monocytes Absolute: 0.9 10*3/uL (ref 0.1–1.0)
Monocytes Relative: 10 %
Neutro Abs: 6.2 10*3/uL (ref 1.7–7.7)
Neutrophils Relative %: 66 %
Platelets: 307 10*3/uL (ref 150–400)
RBC: 4.76 MIL/uL (ref 4.22–5.81)
RDW: 14.7 % (ref 11.5–15.5)
WBC: 9.5 10*3/uL (ref 4.0–10.5)
nRBC: 0 % (ref 0.0–0.2)

## 2019-05-15 LAB — LIPASE, BLOOD: Lipase: 21 U/L (ref 11–51)

## 2019-05-15 LAB — TROPONIN I (HIGH SENSITIVITY): Troponin I (High Sensitivity): 6 ng/L (ref <18)

## 2019-05-15 MED ORDER — IOHEXOL 300 MG/ML  SOLN
100.0000 mL | Freq: Once | INTRAMUSCULAR | Status: AC | PRN
  Administered 2019-05-15: 100 mL via INTRAVENOUS

## 2019-05-15 MED ORDER — AMOXICILLIN-POT CLAVULANATE 875-125 MG PO TABS: 1.0000 | ORAL_TABLET | Freq: Two times a day (BID) | ORAL | 0 refills | Status: AC

## 2019-05-15 MED ORDER — SUCRALFATE 1 G PO TABS: 1.0000 g | ORAL_TABLET | Freq: Four times a day (QID) | ORAL | 0 refills | Status: DC

## 2019-05-15 NOTE — ED Provider Notes (Signed)
[facility]    CSN: [id number] Arrival date & time: 05/15/19  1315      History   Chief Complaint Chief Complaint  Patient presents with  . Abdominal Pain    HPI Oscar Brock is a 79 y.o. male.   Oscar Brock presents with his wife with complaints of abdominal pain. The pain seems to start to mid back between distal shoulder blades, wraps around to left shoulder, to his left chest and to central abdomen. Started approximately 3 weeks ago and has been worsening. Comes and goes but has been worsening recently, today even worse. No vomiting. Has been nauseated. Has had constipation, didn't have a BM for approximately a week, took miralax and stool softener and was able to have two large bowel movements yesterday. No fevers. No changes to urination. No blood in urine. Saw his PCP last week and was told his urine specimen was normal. Approximately 4 weeks ago was diagnosed with shingles to left back. Lesions are scabbed currently. Follows with cardiology related to chronic afib, has a pacer, is on a blood thinner. History of CVA with left hemiplegia. History of gtube related to stroke which has been removed/reversed. Decreased appetite. History of kidney stones which have felt somewhat similar in the past.     ROS per HPI, negative if not otherwise mentioned.      Past Medical History:  Diagnosis Date  . Anxiety   . Arthritis   . Atrial fibrillation (HCC)   . 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 COPD (chronic obstructive pulmonary disease) (HCC)   . Diabetes mellitus without complication (HCC)   . GERD (gastroesophageal reflux disease)   . HTN (hypertension) 04/11/2017  . Hyperlipidemia 02/18/2009  . Hypertension   . Kidney stones   . 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 Stroke (HCC)   . Tremor 07/12/2016    Patient Active Problem List   Diagnosis Date Noted  . Mechanical complication of gastrostomy (HCC)   . Gastrostomy tube in place  (HCC) 08/11/2017  . 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 Benign prostatic hyperplasia 04/24/2017  . Degenerative arthritis 04/24/2017  . Sleep apnea 04/24/2017  . HTN (hypertension) 04/11/2017  . Diabetes (HCC) 04/11/2017  . History of stroke 04/11/2017  . COPD (chronic obstructive pulmonary disease) (HCC) 04/11/2017  . Sinoatrial node dysfunction (HCC) 04/11/2017  . Hypoglycemia 04/11/2017  . 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 Chronic idiopathic constipation 10/10/2016  . Diabetic polyneuropathy (HCC) 10/10/2016  . Difficulty walking 09/23/2016  . Tremor 07/12/2016  . Unstable gait 07/12/2016  . 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 Chronic anticoagulation 01/24/2014  . High risk medication use 10/16/2013  . Pure hypercholesterolemia 10/13/2013  . 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 Atherosclerotic heart disease of native coronary artery without angina pectoris 02/18/2009  . Gastro-esophageal reflux disease without esophagitis 02/18/2009  . Hyperlipidemia 02/18/2009  . Rheumatoid arthritis (HCC) 02/18/2009    Past Surgical History:  Procedure Laterality Date  . APPENDECTOMY    . ESOPHAGOGASTRODUODENOSCOPY    . HERNIA REPAIR    . PACEMAKER INSERTION    . PEG PLACEMENT N/A 09/26/2017   Procedure: PERCUTANEOUS ENDOSCOPIC GASTROSTOMY (PEG) PLACEMENT;  Surgeon: Midge Minium, MD;  Location: [facility] ENDOSCOPY;  Service: Endoscopy;  Laterality: N/A;  . PEG TUBE PLACEMENT         Home Medications    Prior to  Admission medications   Medication Sig Start Date End Date Taking? Authorizing Provider  acetaminophen (TYLENOL) 500 MG tablet Take 500 mg by mouth every 6 (six) hours as needed.   Yes [provider]  albuterol (PROVENTIL) (2.5 MG/3ML) 0.083% nebulizer solution 2.5 mg. 03/01/17  Yes [provider]  ALPRAZolam Prudy Feeler) 0.25  MG tablet Take by mouth. 08/01/17  Yes [provider]  alum & mag hydroxide-simeth (ALMACONE DOUBLE STRENGTH) 400-400-40 MG/5ML suspension Take by mouth.   Yes [provider]  amLODipine (NORVASC) 5 MG tablet Take 0.5 tablets (2.5 mg total) by mouth daily. 05/27/17  Yes Sharman Cheek, MD  apixaban (ELIQUIS) 5 MG TABS tablet Take 5 mg by mouth 2 (two) times daily.   Yes [provider]  Artificial Saliva (BIOTENE DRY MOUTH MOISTURIZING) SOLN Take by mouth. 03/01/17  Yes [provider]  aspirin EC 81 MG tablet Take by mouth.   Yes [provider]  atorvastatin (LIPITOR) 40 MG tablet Place 1 tablet into feeding tube daily.  01/25/17  Yes [provider]  bisacodyl (DULCOLAX) 5 MG EC tablet Take 5 mg by mouth.   Yes [provider]  diphenhydrAMINE (BENADRYL) 25 mg capsule Take by mouth.   Yes [provider]  docusate sodium (COLACE) 100 MG capsule Take 1 capsule (100 mg total) by mouth 2 (two) times daily as needed for mild constipation. 04/13/17  Yes Gouru, Deanna Artis, MD  DULoxetine (CYMBALTA) 20 MG capsule Take 1 capsule (20 mg total) by mouth 2 (two) times daily. 04/13/17  Yes Gouru, Deanna Artis, MD  famotidine-calcium carbonate-magnesium hydroxide (PEPCID COMPLETE) 10-800-165 MG chewable tablet Chew by mouth.   Yes [provider]  finasteride (PROSCAR) 5 MG tablet Take 1 tablet by mouth daily. 01/25/17  Yes [provider]  fluticasone furoate-vilanterol (BREO ELLIPTA) 100-25 MCG/INH AEPB Inhale 1 puff into the lungs daily.   Yes [provider]  folic acid (FOLVITE) 1 MG tablet Take 1 mg by mouth daily.   Yes [provider]  gabapentin (NEURONTIN) 100 MG capsule Take 100 mg by mouth daily.  01/26/17  Yes [provider]  glucose blood (PRECISION QID TEST) test strip Use 1 each once daily. Use as instructed. 12/08/15  Yes [provider]  HUMALOG KWIKPEN 100 UNIT/ML KiwkPen   05/25/17  Yes [provider]  Insulin Detemir (LEVEMIR FLEXTOUCH) 100 UNIT/ML Pen Inject into the skin. 05/25/17  Yes [provider]  insulin lispro (HUMALOG KWIKPEN) 100 UNIT/ML KiwkPen Inject into the skin. 06/08/17  Yes [provider]  ipratropium (ATROVENT) 0.02 % nebulizer solution Take 0.5 mg by nebulization 4 (four) times daily.   Yes [provider]  Lancets 28G MISC Use 1 each 2 (two) times daily. Use as instructed.   Yes [provider]  levothyroxine (SYNTHROID, LEVOTHROID) 100 MCG tablet Place 1 tablet into feeding tube daily.  01/25/17  Yes [provider]  Melatonin 3 MG CAPS Take 1 capsule by mouth at bedtime.   Yes [provider]  Methotrexate Sodium (METHOTREXATE, PF,) 200 MG/8ML injection Inject 20 mg into the skin once a week. 04/11/16  Yes [provider]  mirtazapine (REMERON) 7.5 MG tablet Take by mouth. 08/11/17 05/15/19 Yes [provider]  senna-docusate (SENOKOT-S) 8.6-50 MG tablet Take by mouth.   Yes [provider]  [facility] injection  04/06/17  Yes [provider]  sodium chloride HYPERTONIC 3 % nebulizer solution Inhale 4 mL by nebulization 2 (two) times a day.  03/01/17  Yes [provider]  traMADol (ULTRAM) 50 MG tablet Take 1 tablet (50 mg total) by mouth every 6 (six) hours as needed for moderate pain. 04/13/17  Yes Gouru, Illene Silver, MD  amiodarone (PACERONE) 200 MG tablet Take 200 mg by mouth daily.    [provider]  diphenhydramine-acetaminophen (TYLENOL PM) 25-500 MG TABS tablet Take by mouth.    [provider]  famotidine (PEPCID) 20 MG tablet Place 20 mg into feeding tube daily.    [provider]  gabapentin (NEURONTIN) 300 MG capsule  07/27/17   [provider]  insulin aspart (NOVOLOG) 100 UNIT/ML injection Inject 0-5 Units into the skin 3 (three) times daily with meals. CBG < 70: Implement Hypoglycemia Protocol. CBG 70 -  120 (dose in units): 0 CBG 121 - 150 (dose in units): 0 CBG 151 - 200 (dose in units): 1 CBG 201 - 250 (dose in units): 2 CBG 251 - 300 (dose in units): 3 CBG 301 - 350 (dose in units): 4 CBG 351 - 400 (dose in units): 5 Patient not taking: Reported on 12/28/2017 04/13/17   Nicholes Mango, MD  insulin regular (NOVOLIN R,HUMULIN R) 100 units/mL injection Inject into the skin. 03/01/17   [provider]  LEVEMIR FLEXTOUCH 100 UNIT/ML Pen  05/25/17   [provider]  losartan (COZAAR) 25 MG tablet Take 25 mg by mouth daily.    [provider]  magnesium oxide (MAG-OX) 400 MG tablet Take by mouth.    [provider]  omeprazole (PRILOSEC) 20 MG capsule Take by mouth. 05/25/17   [provider]  oxyCODONE (OXY IR/ROXICODONE) 5 MG immediate release tablet Take 1 tablet (5 mg total) by mouth every 4 (four) hours as needed for severe pain. Patient not taking: Reported on 12/28/2017 04/13/17   Nicholes Mango, MD  Polyethylene Glycol 3350 (PEG 3350) POWD Take by mouth.    [provider]  traZODone (DESYREL) 50 MG tablet 50 mg. 03/01/17   [provider]  warfarin (COUMADIN) 5 MG tablet Take by mouth. 04/11/16   [provider]    Family History Family History  Problem Relation Age of Onset  . Heart failure Mother   . Clotting disorder Mother   . Heart failure Father   . Hypertension Father   . Diabetes Father   . Stroke Brother   . Lung cancer Sister     Social History Social History   Tobacco Use  . Smoking status: Former Smoker    Quit date: 1990    Years since quitting: 31.1  . Smokeless tobacco: Never Used  Substance Use Topics  . Alcohol use: No  . Drug use: No     Allergies   Effexor xr [venlafaxine hcl er], Lisinopril, Other, Oxycodone-acetaminophen, Budesonide-formoterol fumarate, Fluticasone, and Propoxyphene   Review of Systems Review of Systems   Physical Exam Triage Vital Signs ED Triage Vitals    Enc Vitals Group     BP 05/15/19 1354 (!) 139/91     Pulse Rate 05/15/19 1354 83     Resp 05/15/19 1354 18     Temp 05/15/19 1354 97.9 F (36.6 C)     Temp Source 05/15/19 1354 Oral     SpO2 05/15/19 1354 100 %     Weight --      Height --      Head Circumference --      Peak Flow --      Pain Score 05/15/19 1334 8  Pain Loc --      Pain Edu? --      Excl. in GC? --    No data found.  Updated Vital Signs BP (!) 139/91 (BP Location: Right Arm)   Pulse 83   Temp 97.9 F (36.6 C) (Oral)   Resp 18   SpO2 100%   Physical Exam Constitutional:      Comments: Chronically ill appearing- wheelchair bound, somewhat altered speech s/p stroke; sling to left arm for comfort and to help him remain upright in his wheelchair   Cardiovascular:     Rate and Rhythm: Normal rate.  Pulmonary:     Effort: Pulmonary effort is normal.  Abdominal:     General: There is distension.     Tenderness: There is abdominal tenderness in the periumbilical area.     Comments: Only mild current abdominal pain at umbilicus, states it currently is improved from previous; distention noted   Skin:    Comments: Scabs noted to left mid/upper back noted from previous shingles   Neurological:     Mental Status: He is alert.     Comments: Left arm in sling related to previous cva    EKG:  ekg with some stinversions noted which appear new from previous ekg's noted, artifact unfortunately present, rate of 84  UC Treatments / Results  Labs (all labs ordered are listed, but only abnormal results are displayed) Labs Reviewed - No data to display  EKG   Radiology No results found.  Procedures Procedures (including critical care time)  Medications Ordered in UC Medications - No data to display  Initial Impression / Assessment and Plan / UC Course  I have reviewed the triage vital signs and the nursing notes.  Pertinent labs & imaging results that were available during my care of the patient were  reviewed by me and considered in my medical decision making (see chart for details).    79 year old chronically ill male with complaints of upper/ periumbilical abdominal pain which seemingly originates to posterior back between shoulder blades, and radiates to left shoulder and chest. Nausea, decreased appetite. Worsening of pain. ekg with changes noted, although difficult to decipher what is new today. Constipation vs obstruction vs acs vs dissection vs gallstones vs kidney stones vs postherpetic neuralgia in differential. With potential changes in EKG opted to provide transport by [facility] as I do feel this patient needs more immediate evaluation. Patient and wife agreeable to plan.   Final Clinical Impressions(s) / UC Diagnoses   Final diagnoses:  Periumbilical abdominal pain  Shoulder blade pain     Discharge Instructions     I do recommend that you go to the ER for further evaluation of your symptoms.     ED Prescriptions    None     PDMP not reviewed this encounter.   Georgetta Haber, NP 05/15/19 [phone]

## 2019-05-15 NOTE — ED Triage Notes (Addendum)
Pt arrives via [facility] from [facility] urgent care for c/o central abdominal pain radiating to left shoulder. Recently had shingles about 3 weeks ago. Pt speaking with Dr. Derrill Kay at this time in NAD, speech clear, A&Ox4. Pt has a pacemaker for chronic a-fib and does take blood thinners. When asked what pt has eaten recently he states "a meal in a bottle".

## 2019-05-15 NOTE — ED Triage Notes (Signed)
Pt reports intermittent pain starting in center of abdomen and radiating to left ribs and up to left shoulder.  Had shingles 4 weeks ago but feels this is different and worsening.  No known aggravating factors.  Some difficulty urinating but was tested for UTI with his PCP last week and was negative.  Wife states PCP felt urinary changes were r/t prostate.  Pt states this pain is closest to when he had a kidney stone.

## 2019-05-15 NOTE — ED Provider Notes (Signed)
[facility] Emergency Department Provider Note   ____________________________________________   I have reviewed the triage vital signs and the nursing notes.   HISTORY  Chief Complaint Arthritis  History limited by: Not Limited   HPI Oscar Brock is a 79 y.o. male who presents to the emergency department today from urgent care via EMS because of concern for chest and abdominal pain giving concern to possible aortic dissection. Per their paperwork patient had been complaining of pain between the shoulder blades that radiated to his stomach. At the time of my exam however the patient's main complaint is for right hip pain. He states he has history of arthritis and that he gets frequent pain. Has noticed it has been worse for the past 2 weeks. He denies any chest or abdominal pain at the time of my exam.    Records reviewed. Per medical record review patient has a history of atrial fibrillation, COPD.   Past Medical History:  Diagnosis Date  . Anxiety   . Arthritis   . Atrial fibrillation (HCC)   . 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 COPD (chronic obstructive pulmonary disease) (HCC)   . Diabetes mellitus without complication (HCC)   . GERD (gastroesophageal reflux disease)   . HTN (hypertension) 04/11/2017  . Hyperlipidemia 02/18/2009  . Hypertension   . Kidney stones   . 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 Stroke (HCC)   . Tremor 07/12/2016    Patient Active Problem List   Diagnosis Date Noted  . Mechanical complication of gastrostomy (HCC)   . Gastrostomy tube in place (HCC) 08/11/2017  . 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 Benign prostatic hyperplasia 04/24/2017  . Degenerative arthritis 04/24/2017  . Sleep apnea 04/24/2017  . HTN (hypertension) 04/11/2017  . Diabetes (HCC) 04/11/2017  . History of stroke 04/11/2017  . COPD (chronic obstructive pulmonary disease) (HCC) 04/11/2017  . Sinoatrial node dysfunction (HCC)  04/11/2017  . Hypoglycemia 04/11/2017  . 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 Chronic idiopathic constipation 10/10/2016  . Diabetic polyneuropathy (HCC) 10/10/2016  . Difficulty walking 09/23/2016  . Tremor 07/12/2016  . Unstable gait 07/12/2016  . 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 Chronic anticoagulation 01/24/2014  . High risk medication use 10/16/2013  . Pure hypercholesterolemia 10/13/2013  . 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 Atherosclerotic heart disease of native coronary artery without angina pectoris 02/18/2009  . Gastro-esophageal reflux disease without esophagitis 02/18/2009  . Hyperlipidemia 02/18/2009  . Rheumatoid arthritis (HCC) 02/18/2009    Past Surgical History:  Procedure Laterality Date  . APPENDECTOMY    . ESOPHAGOGASTRODUODENOSCOPY    . HERNIA REPAIR    . PACEMAKER INSERTION    . PEG PLACEMENT N/A 09/26/2017   Procedure: PERCUTANEOUS ENDOSCOPIC GASTROSTOMY (PEG) PLACEMENT;  Surgeon: Midge Minium, MD;  Location: [facility] ENDOSCOPY;  Service: Endoscopy;  Laterality: N/A;  . PEG TUBE PLACEMENT      Prior to Admission medications   Medication Sig Start Date End Date Taking? Authorizing Provider  acetaminophen (TYLENOL) 500 MG tablet Take 500 mg by mouth every 6 (six) hours as needed.    [provider]  albuterol (PROVENTIL) (2.5 MG/3ML) 0.083% nebulizer solution 2.5 mg. 03/01/17   [provider]  ALPRAZolam Prudy Feeler) 0.25 MG tablet Take by mouth. 08/01/17   [provider]  alum & mag hydroxide-simeth (ALMACONE DOUBLE STRENGTH) 400-400-40 MG/5ML suspension Take by mouth.  [provider]  amiodarone (PACERONE) 200 MG tablet Take 200 mg by mouth daily.    [provider]  amLODipine (NORVASC) 5 MG tablet Take 0.5 tablets (2.5 mg total) by mouth daily. 05/27/17   Carrie Mew, MD   apixaban (ELIQUIS) 5 MG TABS tablet Take 5 mg by mouth 2 (two) times daily.    [provider]  Artificial Saliva (BIOTENE DRY MOUTH MOISTURIZING) SOLN Take by mouth. 03/01/17   [provider]  aspirin EC 81 MG tablet Take by mouth.    [provider]  atorvastatin (LIPITOR) 40 MG tablet Place 1 tablet into feeding tube daily.  01/25/17   [provider]  bisacodyl (DULCOLAX) 5 MG EC tablet Take 5 mg by mouth.    [provider]  diphenhydrAMINE (BENADRYL) 25 mg capsule Take by mouth.    [provider]  diphenhydramine-acetaminophen (TYLENOL PM) 25-500 MG TABS tablet Take by mouth.    [provider]  docusate sodium (COLACE) 100 MG capsule Take 1 capsule (100 mg total) by mouth 2 (two) times daily as needed for mild constipation. 04/13/17   Gouru, Illene Silver, MD  DULoxetine (CYMBALTA) 20 MG capsule Take 1 capsule (20 mg total) by mouth 2 (two) times daily. 04/13/17   Nicholes Mango, MD  famotidine (PEPCID) 20 MG tablet Place 20 mg into feeding tube daily.    [provider]  famotidine-calcium carbonate-magnesium hydroxide (PEPCID COMPLETE) 10-800-165 MG chewable tablet Chew by mouth.    [provider]  finasteride (PROSCAR) 5 MG tablet Take 1 tablet by mouth daily. 01/25/17   [provider]  fluticasone furoate-vilanterol (BREO ELLIPTA) 100-25 MCG/INH AEPB Inhale 1 puff into the lungs daily.    [provider]  folic acid (FOLVITE) 1 MG tablet Take 1 mg by mouth daily.    [provider]  gabapentin (NEURONTIN) 100 MG capsule Take 100 mg by mouth daily.  01/26/17   [provider]  gabapentin (NEURONTIN) 300 MG capsule  07/27/17   [provider]  glucose blood (PRECISION QID TEST) test strip Use 1 each once daily. Use as instructed. 12/08/15   [provider]  Cleda Clarks 100 UNIT/ML Mayer Masker  05/25/17   [provider]  insulin aspart (NOVOLOG) 100 UNIT/ML  injection Inject 0-5 Units into the skin 3 (three) times daily with meals. CBG < 70: Implement Hypoglycemia Protocol. CBG 70 - 120 (dose in units): 0 CBG 121 - 150 (dose in units): 0 CBG 151 - 200 (dose in units): 1 CBG 201 - 250 (dose in units): 2 CBG 251 - 300 (dose in units): 3 CBG 301 - 350 (dose in units): 4 CBG 351 - 400 (dose in units): 5 Patient not taking: Reported on 12/28/2017 04/13/17   Nicholes Mango, MD  Insulin Detemir (LEVEMIR FLEXTOUCH) 100 UNIT/ML Pen Inject into the skin. 05/25/17   [provider]  insulin lispro (HUMALOG KWIKPEN) 100 UNIT/ML KiwkPen Inject into the skin. 06/08/17   [provider]  insulin regular (NOVOLIN R,HUMULIN R) 100 units/mL injection Inject into the skin. 03/01/17   [provider]  ipratropium (ATROVENT) 0.02 % nebulizer solution Take 0.5 mg by nebulization 4 (four) times daily.    [provider]  Lancets 28G MISC Use 1 each 2 (two) times daily. Use as instructed.    [provider]  LEVEMIR FLEXTOUCH 100 UNIT/ML Pen  05/25/17   [provider]  levothyroxine (SYNTHROID, LEVOTHROID) 100 MCG tablet Place 1 tablet into feeding  tube daily.  01/25/17   [provider]  losartan (COZAAR) 25 MG tablet Take 25 mg by mouth daily.    [provider]  magnesium oxide (MAG-OX) 400 MG tablet Take by mouth.    [provider]  Melatonin 3 MG CAPS Take 1 capsule by mouth at bedtime.    [provider]  Methotrexate Sodium (METHOTREXATE, PF,) 200 MG/8ML injection Inject 20 mg into the skin once a week. 04/11/16   [provider]  mirtazapine (REMERON) 7.5 MG tablet Take by mouth. 08/11/17 05/15/19  [provider]  omeprazole (PRILOSEC) 20 MG capsule Take by mouth. 05/25/17   [provider]  oxyCODONE (OXY IR/ROXICODONE) 5 MG immediate release tablet Take 1 tablet (5 mg total) by mouth every 4 (four) hours as needed for severe pain. Patient not taking:  Reported on 12/28/2017 04/13/17   [name], MD  Polyethylene Glycol 3350 (PEG 3350) POWD Take by mouth.    [provider]  senna-docusate (SENOKOT-S) 8.6-50 MG tablet Take by mouth.    [provider]  [facility] injection  04/06/17   [provider]  sodium chloride HYPERTONIC 3 % nebulizer solution Inhale 4 mL by nebulization 2 (two) times a day. 03/01/17   [provider]  traMADol (ULTRAM) 50 MG tablet Take 1 tablet (50 mg total) by mouth every 6 (six) hours as needed for moderate pain. 04/13/17   [name], MD  traZODone (DESYREL) 50 MG tablet 50 mg. 03/01/17   [provider]  warfarin (COUMADIN) 5 MG tablet Take by mouth. 04/11/16   [provider]    Allergies Effexor xr [venlafaxine hcl er], Lisinopril, Other, Oxycodone-acetaminophen, Budesonide-formoterol fumarate, Fluticasone, and Propoxyphene  Family History  Problem Relation Age of Onset  . Heart failure Mother   . Clotting disorder Mother   . Heart failure Father   . Hypertension Father   . Diabetes Father   . Stroke Brother   . Lung cancer Sister     Social History Social History   Tobacco Use  . Smoking status: Former Smoker    Quit date: 1990    Years since quitting: 31.1  . Smokeless tobacco: Never Used  Substance Use Topics  . Alcohol use: No  . Drug use: No    Review of Systems Constitutional: No fever/chills Eyes: No visual changes. ENT: No sore throat. Cardiovascular: Denies chest pain. Respiratory: Denies shortness of breath. Gastrointestinal: Positive for abdominal pain.   Genitourinary: Negative for dysuria. Musculoskeletal: Negative for back pain. Skin: Negative for rash. Neurological: Negative for headaches, focal weakness or numbness.  ____________________________________________   PHYSICAL EXAM:  VITAL SIGNS: ED Triage Vitals  Enc Vitals Group     BP -- 130/81     Pulse -- 88     Resp -- 18     Temp --      Temp src --       SpO2 -- 97     Weight 05/15/19 1544 190 lb (86.2 kg)     Height 05/15/19 1544 5\' 6"  (1.676 m)     Head Circumference --      Peak Flow --      Pain Score 05/15/19 1543 4     Pain Loc --      Pain Edu? --      Excl. in GC? --    Constitutional: Alert and oriented.  Eyes: Conjunctivae are normal.  ENT      Head: Normocephalic and atraumatic.  Nose: No congestion/rhinnorhea.      Mouth/Throat: Mucous membranes are moist.      Neck: No stridor. Hematological/Lymphatic/Immunilogical: No cervical lymphadenopathy. Cardiovascular: Normal rate, regular rhythm.  No murmurs, rubs, or gallops.  Respiratory: Normal respiratory effort without tachypnea nor retractions. Breath sounds are clear and equal bilaterally. No wheezes/rales/rhonchi. Gastrointestinal: Soft and non tender. No rebound. No guarding.  Genitourinary: Deferred Musculoskeletal: Normal range of motion in all extremities. No lower extremity edema. Neurologic:  Normal speech and language. No gross focal neurologic deficits are appreciated.  Skin:  Skin is warm, dry and intact. No rash noted. Psychiatric: Mood and affect are normal. Speech and behavior are normal. Patient exhibits appropriate insight and judgment.  ____________________________________________    LABS (pertinent positives/negatives)  Lipase 21 Trop hs 6 CBC wbc 9.5, hgb 14.9, plt 307 CMP wnl except na 134, co2 20, glu 165  ____________________________________________   EKG  I, Phineas Semen, attending physician, personally viewed and interpreted this EKG  EKG Time: 1428 Rate: 84 Rhythm: atrial fibrillation Axis: normal Intervals: qtc 451 QRS: narrow ST changes: no st elevation Impression: abnormal ekg  ____________________________________________    RADIOLOGY  CXR No acute cardiopulmonary process  CT abd/pel Haziness within mesentery with enlarged lymph nodes. Findings suggestive of inflammation/infection or possible neoplasm.  Possible gastritis/esophagitis.  ____________________________________________   PROCEDURES  Procedures  ____________________________________________   INITIAL IMPRESSION / ASSESSMENT AND PLAN / ED COURSE  Pertinent labs & imaging results that were available during my care of the patient were reviewed by me and considered in my medical decision making (see chart for details).   Patient presented to the emergency department today from urgent care because of concerns for chest pain with abdominal pain.  Appears that they did have some concern for possible aortic disease.  On exam patient is mainly complaining of abdominal pain.  He does have some tenderness to palpation of the left side of the abdomen.  This point I doubt aortic disease.  Did obtain a CT scan of the stomach which was concerning for some haziness around the mesentery.  I discussed this with patient family.  Discussed possibility of infection or inflammation versus possible neoplasm.  Will treat patient with antibiotics.  Did discuss importance of following up with primary care for repeat imaging.  ____________________________________________   FINAL CLINICAL IMPRESSION(S) / ED DIAGNOSES  Final diagnoses:  Abdominal pain, unspecified abdominal location     Note: This dictation was prepared with Dragon dictation. Any transcriptional errors that result from this process are unintentional     Phineas Semen, MD 05/15/19 2306

## 2019-05-15 NOTE — Discharge Instructions (Addendum)
I do recommend that you go to the ER for further evaluation of your symptoms.

## 2019-05-15 NOTE — ED Notes (Signed)
Pt trx to CT.  

## 2019-05-15 NOTE — ED Notes (Signed)
Provided pt w/ H2O per pt request and EDP approval. Observed pt drink w/o complication. Wife at bedside.

## 2019-05-15 NOTE — ED Notes (Signed)
Wife at bedside at this time.

## 2019-05-15 NOTE — Discharge Instructions (Addendum)
Please seek medical attention for any high fevers, chest pain, shortness of breath, change in behavior, persistent vomiting, bloody stool or any other new or concerning symptoms.  

## 2019-05-19 ENCOUNTER — Encounter: Payer: Self-pay | Admitting: Radiology

## 2019-05-19 ENCOUNTER — Emergency Department
Admission: EM | Admit: 2019-05-19 | Discharge: 2019-05-19 | Disposition: A | Discharge status: Home / Self Care | Payer: Medicare Other | Attending: Emergency Medicine | Admitting: Emergency Medicine

## 2019-05-19 ENCOUNTER — Emergency Department: Payer: Medicare Other

## 2019-05-19 ENCOUNTER — Other Ambulatory Visit: Payer: Self-pay

## 2019-05-19 DIAGNOSIS — R109 Unspecified abdominal pain: Secondary | ICD-10-CM | POA: Insufficient documentation

## 2019-05-19 DIAGNOSIS — I10 Essential (primary) hypertension: Secondary | ICD-10-CM | POA: Diagnosis not present

## 2019-05-19 DIAGNOSIS — Z7901 Long term (current) use of anticoagulants: Secondary | ICD-10-CM | POA: Insufficient documentation

## 2019-05-19 DIAGNOSIS — R0789 Other chest pain: Secondary | ICD-10-CM | POA: Diagnosis not present

## 2019-05-19 DIAGNOSIS — I2510 Atherosclerotic heart disease of native coronary artery without angina pectoris: Secondary | ICD-10-CM | POA: Diagnosis not present

## 2019-05-19 DIAGNOSIS — E119 Type 2 diabetes mellitus without complications: Secondary | ICD-10-CM | POA: Insufficient documentation

## 2019-05-19 DIAGNOSIS — Z794 Long term (current) use of insulin: Secondary | ICD-10-CM | POA: Insufficient documentation

## 2019-05-19 DIAGNOSIS — J449 Chronic obstructive pulmonary disease, unspecified: Secondary | ICD-10-CM | POA: Insufficient documentation

## 2019-05-19 DIAGNOSIS — Z87891 Personal history of nicotine dependence: Secondary | ICD-10-CM | POA: Diagnosis not present

## 2019-05-19 DIAGNOSIS — Z950 Presence of cardiac pacemaker: Secondary | ICD-10-CM | POA: Insufficient documentation

## 2019-05-19 DIAGNOSIS — R079 Chest pain, unspecified: Secondary | ICD-10-CM

## 2019-05-19 DIAGNOSIS — Z7982 Long term (current) use of aspirin: Secondary | ICD-10-CM | POA: Insufficient documentation

## 2019-05-19 LAB — COMPREHENSIVE METABOLIC PANEL
ALT: 29 U/L (ref 0–44)
AST: 29 U/L (ref 15–41)
Albumin: 4.3 g/dL (ref 3.5–5.0)
Alkaline Phosphatase: 64 U/L (ref 38–126)
Anion gap: 8 (ref 5–15)
BUN: 12 mg/dL (ref 8–23)
CO2: 26 mmol/L (ref 22–32)
Calcium: 9.3 mg/dL (ref 8.9–10.3)
Chloride: 97 mmol/L — ABNORMAL LOW (ref 98–111)
Creatinine, Ser: 0.84 mg/dL (ref 0.61–1.24)
GFR calc Af Amer: >60 mL/min (ref >60)
GFR calc non Af Amer: >60 mL/min (ref >60)
Glucose, Bld: 153 mg/dL — ABNORMAL HIGH (ref 70–99)
Potassium: 4.9 mmol/L (ref 3.5–5.1)
Sodium: 131 mmol/L — ABNORMAL LOW (ref 135–145)
Total Bilirubin: 0.9 mg/dL (ref 0.3–1.2)
Total Protein: 7.8 g/dL (ref 6.5–8.1)

## 2019-05-19 LAB — TROPONIN I (HIGH SENSITIVITY)
Troponin I (High Sensitivity): 8 ng/L (ref <18)
Troponin I (High Sensitivity): 8 ng/L (ref <18)

## 2019-05-19 LAB — CBC WITH DIFFERENTIAL/PLATELET
Abs Immature Granulocytes: 0.04 10*3/uL (ref 0.00–0.07)
Basophils Absolute: 0.1 10*3/uL (ref 0.0–0.1)
Basophils Relative: 1 %
Eosinophils Absolute: 0.2 10*3/uL (ref 0.0–0.5)
Eosinophils Relative: 2 %
HCT: 46.4 % (ref 39.0–52.0)
Hemoglobin: 15.5 g/dL (ref 13.0–17.0)
Immature Granulocytes: 0 %
Lymphocytes Relative: 31 %
Lymphs Abs: 3 10*3/uL (ref 0.7–4.0)
MCH: 31.5 pg (ref 26.0–34.0)
MCHC: 33.4 g/dL (ref 30.0–36.0)
MCV: 94.3 fL (ref 80.0–100.0)
Monocytes Absolute: 1.2 10*3/uL — ABNORMAL HIGH (ref 0.1–1.0)
Monocytes Relative: 12 %
Neutro Abs: 5.3 10*3/uL (ref 1.7–7.7)
Neutrophils Relative %: 54 %
Platelets: 298 10*3/uL (ref 150–400)
RBC: 4.92 MIL/uL (ref 4.22–5.81)
RDW: 14.3 % (ref 11.5–15.5)
WBC: 9.8 10*3/uL (ref 4.0–10.5)
nRBC: 0 % (ref 0.0–0.2)

## 2019-05-19 LAB — URINALYSIS, COMPLETE (UACMP) WITH MICROSCOPIC
Bacteria, UA: NONE SEEN
Bilirubin Urine: NEGATIVE
Glucose, UA: NEGATIVE mg/dL
Hgb urine dipstick: NEGATIVE
Ketones, ur: NEGATIVE mg/dL
Leukocytes,Ua: NEGATIVE
Nitrite: NEGATIVE
Protein, ur: NEGATIVE mg/dL
Specific Gravity, Urine: 1.038 — ABNORMAL HIGH (ref 1.005–1.030)
Squamous Epithelial / HPF: NONE SEEN (ref 0–5)
pH: 6 (ref 5.0–8.0)

## 2019-05-19 LAB — LIPASE, BLOOD: Lipase: 22 U/L (ref 11–51)

## 2019-05-19 MED ORDER — IOHEXOL 350 MG/ML SOLN
100.0000 mL | Freq: Once | INTRAVENOUS | Status: AC | PRN
  Administered 2019-05-19: 100 mL via INTRAVENOUS

## 2019-05-19 NOTE — ED Notes (Signed)
Pt is forgetful, alert, oriented to self and situation only.

## 2019-05-19 NOTE — ED Triage Notes (Signed)
Pt c/o abdominal pain since last Wednesday (was in hospital for) that has stayed the same since. Pt has history of a-fibb and pacemaker and active shingles at this time per ems. Per ems HR increased to 180 en route. BGL was 189. Pt AOx4, nad noted.  Shingles rash on abdomin noted. Pt denies n/v/d.

## 2019-05-19 NOTE — ED Notes (Signed)
Attempted to call wife X3 without success.

## 2019-05-19 NOTE — ED Notes (Signed)
Pt states he does not need pain medication at this time. Pt states he is worried about wife outside and wants to go home. Pt notified that wife was updated and went home until disposition. Pt verbalizes understanding. Pt requests male to assist with urinal- male tech called.

## 2019-05-19 NOTE — ED Notes (Signed)
Called wife to notify plan is to discharge per MD. Update given on condition and follow up instructions. Number provided to call when wife arrives.

## 2019-05-19 NOTE — ED Notes (Signed)
Wife at bedside with wheelchair.

## 2019-05-19 NOTE — ED Notes (Signed)
Wife Nile Dear called at this time- RN update provided. Wife states that they will need to go home and get Zenaida Niece if pt is discharged. Informed wife that they may go home and will be called when plan of care is known.

## 2019-05-19 NOTE — ED Notes (Signed)
Wife called at this time. Update provided- unsure if pt will be admitted or discharged at this time and working with new emergency pt. Delay explained to wife. Wife states "call me back really soon and tell me what the plan is."

## 2019-05-19 NOTE — ED Provider Notes (Signed)
[facility] Emergency Department Provider Note  ____________________________________________   I have reviewed the triage vital signs and the nursing notes.   HISTORY  Chief Complaint Chest pain  History limited by: Poor historina   HPI Oscar Brock is a 79 y.o. male who presents to the emergency department today because of concern for chest pain. He states it is located in his left upper chest. Started this morning, although was not present when he first woke up. Has not noticed any significant shortness of breath. Denies any fevers. Denies any cough, nausea or vomiting. Was seen in the emergency department a few days ago for abdominal pain. He has a history of rheumatoid arthritis and recent shingles to his abdomen.    Records reviewed. Per medical record review patient has a history of recent ER visit with negative CXR, CT abd/pel.   Past Medical History:  Diagnosis Date  . Anxiety   . Arthritis   . Atrial fibrillation (HCC)   . 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 COPD (chronic obstructive pulmonary disease) (HCC)   . Diabetes mellitus without complication (HCC)   . GERD (gastroesophageal reflux disease)   . HTN (hypertension) 04/11/2017  . Hyperlipidemia 02/18/2009  . Hypertension   . Kidney stones   . 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 Stroke (HCC)   . Tremor 07/12/2016    Patient Active Problem List   Diagnosis Date Noted  . Mechanical complication of gastrostomy (HCC)   . Gastrostomy tube in place (HCC) 08/11/2017  . 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 Benign prostatic hyperplasia 04/24/2017  . Degenerative arthritis 04/24/2017  . Sleep apnea 04/24/2017  . HTN (hypertension) 04/11/2017  . Diabetes (HCC) 04/11/2017  . History of stroke 04/11/2017  . COPD (chronic obstructive pulmonary disease) (HCC) 04/11/2017  . Sinoatrial node dysfunction (HCC) 04/11/2017  . Hypoglycemia 04/11/2017  . 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 Chronic idiopathic constipation 10/10/2016  . Diabetic polyneuropathy (HCC) 10/10/2016  . Difficulty walking 09/23/2016  . Tremor 07/12/2016  . Unstable gait 07/12/2016  . 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 Chronic anticoagulation 01/24/2014  . High risk medication use 10/16/2013  . Pure hypercholesterolemia 10/13/2013  . 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 Atherosclerotic heart disease of native coronary artery without angina pectoris 02/18/2009  . Gastro-esophageal reflux disease without esophagitis 02/18/2009  . Hyperlipidemia 02/18/2009  . Rheumatoid arthritis (HCC) 02/18/2009    Past Surgical History:  Procedure Laterality Date  . APPENDECTOMY    . ESOPHAGOGASTRODUODENOSCOPY    . HERNIA REPAIR    . PACEMAKER INSERTION    . PEG PLACEMENT N/A 09/26/2017   Procedure: PERCUTANEOUS ENDOSCOPIC GASTROSTOMY (PEG) PLACEMENT;  Surgeon: Midge Minium, MD;  Location: [facility] ENDOSCOPY;  Service: Endoscopy;  Laterality: N/A;  . PEG TUBE PLACEMENT      Prior to Admission medications   Medication Sig Start Date End Date Taking? Authorizing Provider  acetaminophen (TYLENOL) 500 MG tablet Take 500 mg by mouth every 6 (six) hours as needed.    [provider]  albuterol (PROVENTIL) (2.5 MG/3ML) 0.083% nebulizer solution 2.5 mg. 03/01/17   [provider]  ALPRAZolam Prudy Feeler) 0.25 MG tablet Take by mouth. 08/01/17   [provider]  alum & mag hydroxide-simeth (ALMACONE DOUBLE STRENGTH) 400-400-40 MG/5ML suspension Take by mouth.    [provider]  amiodarone (PACERONE) 200 MG tablet Take 200  mg by mouth daily.    [provider]  amLODipine (NORVASC) 5 MG tablet Take 0.5 tablets (2.5 mg total) by mouth daily. 05/27/17   Sharman Cheek, MD  amoxicillin-clavulanate (AUGMENTIN) 875-125 MG tablet  Take 1 tablet by mouth 2 (two) times daily for 10 days. 05/15/19 05/25/19  Phineas Semen, MD  apixaban (ELIQUIS) 5 MG TABS tablet Take 5 mg by mouth 2 (two) times daily.    [provider]  Artificial Saliva (BIOTENE DRY MOUTH MOISTURIZING) SOLN Take by mouth. 03/01/17   [provider]  aspirin EC 81 MG tablet Take by mouth.    [provider]  atorvastatin (LIPITOR) 40 MG tablet Place 1 tablet into feeding tube daily.  01/25/17   [provider]  bisacodyl (DULCOLAX) 5 MG EC tablet Take 5 mg by mouth.    [provider]  diphenhydrAMINE (BENADRYL) 25 mg capsule Take by mouth.    [provider]  diphenhydramine-acetaminophen (TYLENOL PM) 25-500 MG TABS tablet Take by mouth.    [provider]  docusate sodium (COLACE) 100 MG capsule Take 1 capsule (100 mg total) by mouth 2 (two) times daily as needed for mild constipation. 04/13/17   Gouru, Deanna Artis, MD  DULoxetine (CYMBALTA) 20 MG capsule Take 1 capsule (20 mg total) by mouth 2 (two) times daily. 04/13/17   [name], MD  famotidine (PEPCID) 20 MG tablet Place 20 mg into feeding tube daily.    [provider]  famotidine-calcium carbonate-magnesium hydroxide (PEPCID COMPLETE) 10-800-165 MG chewable tablet Chew by mouth.    [provider]  finasteride (PROSCAR) 5 MG tablet Take 1 tablet by mouth daily. 01/25/17   [provider]  fluticasone furoate-vilanterol (BREO ELLIPTA) 100-25 MCG/INH AEPB Inhale 1 puff into the lungs daily.    [provider]  folic acid (FOLVITE) 1 MG tablet Take 1 mg by mouth daily.    [provider]  gabapentin (NEURONTIN) 100 MG capsule Take 100 mg by mouth daily.  01/26/17   [provider]  gabapentin (NEURONTIN) 300 MG capsule  07/27/17   [provider]  glucose blood (PRECISION QID TEST) test strip Use 1 each once daily. Use as instructed. 12/08/15   [provider]  Catalina Lunger  100 UNIT/ML Ulice Brilliant  05/25/17   [provider]  insulin aspart (NOVOLOG) 100 UNIT/ML injection Inject 0-5 Units into the skin 3 (three) times daily with meals. CBG < 70: Implement Hypoglycemia Protocol. CBG 70 - 120 (dose in units): 0 CBG 121 - 150 (dose in units): 0 CBG 151 - 200 (dose in units): 1 CBG 201 - 250 (dose in units): 2 CBG 251 - 300 (dose in units): 3 CBG 301 - 350 (dose in units): 4 CBG 351 - 400 (dose in units): 5 Patient not taking: Reported on 12/28/2017 04/13/17   [name], MD  Insulin Detemir (LEVEMIR FLEXTOUCH) 100 UNIT/ML Pen Inject into the skin. 05/25/17   [provider]  insulin lispro (HUMALOG KWIKPEN) 100 UNIT/ML KiwkPen Inject into the skin. 06/08/17   [provider]  insulin regular (NOVOLIN R,HUMULIN R) 100 units/mL injection Inject into the skin. 03/01/17   [provider]  ipratropium (ATROVENT) 0.02 % nebulizer solution Take 0.5 mg by nebulization 4 (four) times daily.    [provider]  Lancets 28G MISC Use 1 each 2 (two) times daily. Use as instructed.    [provider]  LEVEMIR FLEXTOUCH 100 UNIT/ML Pen  05/25/17   [provider]  levothyroxine (SYNTHROID, LEVOTHROID) 100 MCG tablet Place 1 tablet into feeding tube daily.  01/25/17   [provider]  losartan (COZAAR) 25 MG tablet Take 25 mg by mouth daily.    [provider]  magnesium oxide (MAG-OX) 400 MG tablet Take by mouth.    [provider]  Melatonin 3 MG CAPS Take 1 capsule by mouth at bedtime.    [provider]  Methotrexate Sodium (METHOTREXATE, PF,) 200 MG/8ML injection Inject 20 mg into the skin once a week. 04/11/16   [provider]  mirtazapine (REMERON) 7.5 MG tablet Take by mouth. 08/11/17 05/15/19  [provider]  omeprazole (PRILOSEC) 20 MG capsule Take by mouth. 05/25/17   [provider]  oxyCODONE (OXY IR/ROXICODONE) 5 MG immediate release tablet Take 1 tablet  (5 mg total) by mouth every 4 (four) hours as needed for severe pain. Patient not taking: Reported on 12/28/2017 04/13/17   Nicholes Mango, MD  Polyethylene Glycol 3350 (PEG 3350) POWD Take by mouth.    [provider]  senna-docusate (SENOKOT-S) 8.6-50 MG tablet Take by mouth.    [provider]  [facility] injection  04/06/17   [provider]  sodium chloride HYPERTONIC 3 % nebulizer solution Inhale 4 mL by nebulization 2 (two) times a day. 03/01/17   [provider]  sucralfate (CARAFATE) 1 g tablet Take 1 tablet (1 g total) by mouth 4 (four) times daily for 10 days. 05/15/19 05/25/19  Nance Pear, MD  traMADol (ULTRAM) 50 MG tablet Take 1 tablet (50 mg total) by mouth every 6 (six) hours as needed for moderate pain. 04/13/17   Nicholes Mango, MD  traZODone (DESYREL) 50 MG tablet 50 mg. 03/01/17   [provider]  warfarin (COUMADIN) 5 MG tablet Take by mouth. 04/11/16   [provider]    Allergies Effexor xr [venlafaxine hcl er], Lisinopril, Other, Oxycodone-acetaminophen, Budesonide-formoterol fumarate, Fluticasone, and Propoxyphene  Family History  Problem Relation Age of Onset  . Heart failure Mother   . Clotting disorder Mother   . Heart failure Father   . Hypertension Father   . Diabetes Father   . Stroke Brother   . Lung cancer Sister     Social History Social History   Tobacco Use  . Smoking status: Former Smoker    Quit date: 1990    Years since quitting: 31.2  . Smokeless tobacco: Never Used  Substance Use Topics  . Alcohol use: No  . Drug use: No    Review of Systems Constitutional: No fever/chills Eyes: No visual changes. ENT: No sore throat. Cardiovascular: Positive for chest pain.  Respiratory: Denies shortness of breath. Gastrointestinal: Positive for abdominal pain.  Genitourinary: Negative for dysuria. Musculoskeletal: Negative for back pain. Skin: Negative for rash. Neurological: Negative for  headaches, focal weakness or numbness.  ____________________________________________   PHYSICAL EXAM:  VITAL SIGNS: ED Triage Vitals  Enc Vitals Group     BP 05/19/19 1715 (!) 174/104     Pulse Rate 05/19/19 1715 98     Resp 05/19/19 1715 16     Temp 05/19/19 1723 98.7 F (37.1 C)     Temp Source 05/19/19 1723 Oral     SpO2 05/19/19 1715 100 %     Weight 05/19/19 1717 200 lb (90.7 kg)     Height 05/19/19 1717 6\' 3"  (1.905 m)     Head Circumference --      Peak Flow --  Pain Score 05/19/19 1716 3   Constitutional: Alert and oriented.  Eyes: Conjunctivae are normal.  ENT      Head: Normocephalic and atraumatic.      Nose: No congestion/rhinnorhea.      Mouth/Throat: Mucous membranes are moist.      Neck: No stridor. Hematological/Lymphatic/Immunilogical: No cervical lymphadenopathy. Cardiovascular: Normal rate, irregular rhythm.    Respiratory: Normal respiratory effort without tachypnea nor retractions. Breath sounds are clear and equal bilaterally. No wheezes/rales/rhonchi. Gastrointestinal: Soft and non tender. No rebound. No guarding.  Genitourinary: Deferred Musculoskeletal: Normal range of motion in all extremities. No lower extremity edema. Neurologic:  Normal speech and language. No gross focal neurologic deficits are appreciated.  Skin:  Skin is warm, dry and intact.  Psychiatric: Mood and affect are normal. Speech and behavior are normal. Patient exhibits appropriate insight and judgment.  ____________________________________________    LABS (pertinent positives/negatives)  CBC wbc 9.8, hgb 15.5, plt 298 Trop hs 8 Lipase 22 CMP wnl except na 131, cl 97, glu 153 ____________________________________________   EKG  I, Phineas Semen, attending physician, personally viewed and interpreted this EKG  EKG Time: 1711 Rate: 97 Rhythm: atrial fibrillation Axis: normal Intervals: qtc 450 QRS: nonspecific IVCD ST changes: no st elevation Impression:  abnormal ekg  ____________________________________________    RADIOLOGY  CT angio dissection chest/abd/pel No aortic abnormality. Again demonstrated some haziness in mesentery concerning for inflammation/infection. Stenosis of celiac axis.  ____________________________________________   PROCEDURES  Procedures  ____________________________________________   INITIAL IMPRESSION / ASSESSMENT AND PLAN / ED COURSE  Pertinent labs & imaging results that were available during my care of the patient were reviewed by me and considered in my medical decision making (see chart for details).   Patient presented to the emergency department today because of concern for continued chest/abdominal pain. Had been seen a couple of days ago with negative CT abd. Did obtain imaging today of the chest and abdomen to evaluate the aorta. This imaging again demonstrated the haziness in the mesentery. Discussed this with patient. Also showed stenosis of celiac axis. Discussed with Dr. Myra Gianotti with vascular surgery. At this time thinks it would be unlikely this is an acute problem or the etiology of the patient's pain. Will plan on discharging to follow up with primary care.  ___________________________________________   FINAL CLINICAL IMPRESSION(S) / ED DIAGNOSES  Final diagnoses:  Chest pain, unspecified type     Note: This dictation was prepared with Dragon dictation. Any transcriptional errors that result from this process are unintentional     Phineas Semen, MD 05/19/19 2321

## 2019-05-19 NOTE — Discharge Instructions (Addendum)
As we discussed it is very important that you follow up with your physician to further work up the inflammation seen in your abdomen. Please seek medical attention for any high fevers, chest pain, shortness of breath, change in behavior, persistent vomiting, bloody stool or any other new or concerning symptoms.

## 2019-05-19 NOTE — ED Notes (Signed)
PT taken to CT.

## 2019-05-20 ENCOUNTER — Telehealth: Payer: Self-pay | Admitting: Gastroenterology

## 2019-05-20 ENCOUNTER — Ambulatory Visit: Payer: Medicare Other | Admitting: Gastroenterology

## 2019-05-20 VITALS — BP 115/79 | HR 101 | Temp 97.8°F | Ht 75.0 in

## 2019-05-20 DIAGNOSIS — K5903 Drug induced constipation: Secondary | ICD-10-CM

## 2019-05-20 DIAGNOSIS — R109 Unspecified abdominal pain: Secondary | ICD-10-CM | POA: Diagnosis not present

## 2019-05-20 DIAGNOSIS — R933 Abnormal findings on diagnostic imaging of other parts of digestive tract: Secondary | ICD-10-CM

## 2019-05-20 MED ORDER — FAMOTIDINE 40 MG PO TABS: 40.0000 mg | ORAL_TABLET | Freq: Two times a day (BID) | ORAL | 1 refills | Status: DC

## 2019-05-20 NOTE — Progress Notes (Signed)
[name] MD, MRCP(U.K) [address]  [address]  [place], [place] [address]  Main: [phone]  Fax: [phone]   Primary Care Physician: Mickey Farber, MD  Primary Gastroenterologist:  Dr. [name]   Abdominal pain  HPI: Oscar Brock is a 79 y.o. male was previously seen with me for management of his PEG tube.  Presented to the emergency room with chest pain.  Recent shingles history affecting his abdomen.  He had a CT angiogram of the chest abdomen and pelvis to rule out dissection and was found to have persistent retroperitoneal and mesenteric adenopathy this is of unknown clinical significance lymphoma not excluded.  Multiple stones in the urinary bladder.On 05/15/2019 he had a CT scan of the abdomen showed mildly thickened appearance of the gastroesophageal junction which may be related to underdistention.  Mild haziness in the mesentery with multiple mildly enlarged lymph nodes.  Labs on 05/20/2019 for a lipase was normal, troponin normal, CBC normal, CMP elevated glucose low chloride of 97 but creatinine of 0.84.  Accompanied by family and he states that he has had abdominal pain ongoing for 4 weeks.  Lower abdomen in nature.  Not had a bowel movement for 4 days at least.  On tramadol as well as laxatives.  He has been on OxyContin in the past.  Likely contributed to his constipation.  Denies any fevers but has had occasional night sweats.  No loss of weight.  Denies any dysphagia but has had heartburn.  Takes Pepcid daily.  Current Outpatient Medications  Medication Sig Dispense Refill  . acetaminophen (TYLENOL) 325 MG tablet Take 650 mg by mouth every 6 (six) hours as needed for mild pain or fever.     Marland Kitchen albuterol (PROVENTIL) (2.5 MG/3ML) 0.083% nebulizer solution Take 2.5 mg by nebulization every 6 (six) hours as needed for wheezing or shortness of breath.     Marland Kitchen albuterol (VENTOLIN HFA) 108 (90 Base) MCG/ACT inhaler Inhale 2 puffs into the lungs every 4 (four) hours as  needed for wheezing or shortness of breath.    . ALPRAZolam (XANAX) 0.25 MG tablet Take 0.125 mg by mouth daily as needed for anxiety.     Marland Kitchen amoxicillin-clavulanate (AUGMENTIN) 875-125 MG tablet Take 1 tablet by mouth 2 (two) times daily for 10 days. 20 tablet 0  . apixaban (ELIQUIS) 5 MG TABS tablet Take 5 mg by mouth 2 (two) times daily.    . Artificial Saliva (BIOTENE DRY MOUTH MOISTURIZING) SOLN Take 1 spray by mouth every 2 (two) hours as needed (dry mouth).     Marland Kitchen aspirin EC 81 MG tablet Take 81 mg by mouth daily.     Marland Kitchen atorvastatin (LIPITOR) 40 MG tablet Take 40 mg by mouth daily.     . bisacodyl (DULCOLAX) 5 MG EC tablet Take 5 mg by mouth daily as needed for mild constipation.     . diclofenac Sodium (VOLTAREN) 1 % GEL Apply 2 g topically daily as needed (pain).    . DULoxetine (CYMBALTA) 30 MG capsule Take 30 mg by mouth daily.    . famotidine (PEPCID) 20 MG tablet Take 20 mg by mouth 2 (two) times daily.    . finasteride (PROSCAR) 5 MG tablet Take 5 mg by mouth daily.     . fluticasone furoate-vilanterol (BREO ELLIPTA) 100-25 MCG/INH AEPB Inhale 1 puff into the lungs daily.    . folic acid (FOLVITE) 1 MG tablet Take 1 mg by mouth daily.    Marland Kitchen gabapentin (  NEURONTIN) 300 MG capsule Take 300 mg by mouth daily.     Marland Kitchen HUMALOG KWIKPEN 100 UNIT/ML KiwkPen Inject 1-3 Units into the skin every evening.     . Insulin Detemir (LEVEMIR FLEXTOUCH) 100 UNIT/ML Pen Inject 20 Units into the skin daily.     Marland Kitchen ipratropium (ATROVENT) 0.02 % nebulizer solution Take 0.5 mg by nebulization 4 (four) times daily as needed for wheezing or shortness of breath.     . levothyroxine (SYNTHROID) 125 MCG tablet Take 125 mcg by mouth daily.     . Methotrexate Sodium (METHOTREXATE, PF,) 200 MG/8ML injection Inject 15 mg into the skin every Monday.     . metoprolol succinate (TOPROL-XL) 25 MG 24 hr tablet Take 25 mg by mouth 2 (two) times daily.    . mirtazapine (REMERON) 15 MG tablet Take 15 mg by mouth at bedtime.       . senna-docusate (SENOKOT-S) 8.6-50 MG tablet Take 2 tablets by mouth daily.     . sodium chloride HYPERTONIC 3 % nebulizer solution Take 4 mLs by nebulization 2 (two) times daily as needed for other or cough.     . sucralfate (CARAFATE) 1 g tablet Take 1 tablet (1 g total) by mouth 4 (four) times daily for 10 days. 40 tablet 0  . traMADol (ULTRAM) 50 MG tablet Take 50 mg by mouth 2 (two) times daily as needed for pain.     No current facility-administered medications for this visit.    Allergies as of 05/20/2019 - Review Complete 05/19/2019  Allergen Reaction Noted  . Effexor xr [venlafaxine hcl er]  05/22/2016  . Lisinopril  05/22/2016  . Other Other (See Comments) 07/13/2012  . Oxycodone-acetaminophen  02/13/2017  . Budesonide-formoterol fumarate  02/21/2014  . Fluticasone  02/21/2014  . Propoxyphene Other (See Comments) 07/13/2012    ROS:  General: Negative for anorexia, weight loss, fever, chills, fatigue, weakness. ENT: Negative for hoarseness, difficulty swallowing , nasal congestion. CV: Negative for chest pain, angina, palpitations, dyspnea on exertion, peripheral edema.  Respiratory: Negative for dyspnea at rest, dyspnea on exertion, cough, sputum, wheezing.  GI: See history of present illness. GU:  Negative for dysuria, hematuria, urinary incontinence, urinary frequency, nocturnal urination.  Endo: Negative for unusual weight change.    Physical Examination:   There were no vitals taken for this visit.  General: Limited exam as he is in a wheelchair and wearing a brace over his left arm. Eyes: No icterus. Conjunctivae pink. Lungs: Clear to auscultation bilaterally. Non-labored. Heart: Regular rate and rhythm, no murmurs rubs or gallops.  Abdomen: Limited exam as in a wheelchair.  Bowel sounds are normal, nontender, nondistended, no hepatosplenomegaly or masses, no abdominal bruits or hernia , no rebound or guarding.   Neuro: Alert and oriented x 3.   Psych: Alert  and cooperative, normal mood and affect.   Imaging Studies: CT ABDOMEN PELVIS W CONTRAST  Result Date: 05/15/2019 CLINICAL DATA:  79 year old male with left-sided abdominal pain. EXAM: CT ABDOMEN AND PELVIS WITH CONTRAST TECHNIQUE: Multidetector CT imaging of the abdomen and pelvis was performed using the standard protocol following bolus administration of intravenous contrast. CONTRAST:  OMNIPAQUE IOHEXOL 300 MG/ML  SOLN COMPARISON:  CT abdomen pelvis dated 01/27/2011. FINDINGS: Lower chest: The visualized lung bases are clear. Multi vessel coronary vascular calcification and partially visualized pacemaker wire. Mild biatrial dilatation. No intra-abdominal free air or free fluid. Hepatobiliary: Mild irregularity of the liver contour may represent early changes of cirrhosis. Clinical correlation is  recommended. No intrahepatic biliary ductal dilatation. The gallbladder is unremarkable. Pancreas: Mildly atrophic pancreas. No active inflammatory changes. No dilatation of the main pancreatic duct. Spleen: Normal in size without focal abnormality. Adrenals/Urinary Tract: The adrenal glands are unremarkable. There is no hydronephrosis on either side. There is symmetric enhancement and excretion of contrast by both kidneys. There is a 2 cm left anterior interpolar exophytic cyst. The visualized ureters are unremarkable. Multiple small stones noted layering along the posterior bladder wall. Stomach/Bowel: There is sigmoid diverticulosis without active inflammatory changes. There is a small hiatal hernia. Mildly thickened appearance of the gastroesophageal junction may be related to underdistention. Clinical correlation is recommended to evaluate for gastritis or esophagitis. There is no bowel obstruction. Appendectomy. Vascular/Lymphatic: Moderate aortoiliac atherosclerotic disease. The IVC is unremarkable. No portal venous gas. There is mild haziness of the mesentery with multiple mildly enlarged lymph nodes  with a "misty mesentery" appearance. This finding is nonspecific but may be related to underlying inflammatory/infectious etiology. A neoplastic process is not excluded. Correlation with history of known malignancy and follow-up recommended. Reproductive: The prostate and seminal vesicles are grossly unremarkable. Other: None Musculoskeletal: Osteopenia with degenerative changes of the spine. No acute osseous pathology. IMPRESSION: 1. No acute intra-abdominal or pelvic pathology. 2. Sigmoid diverticulosis. No bowel obstruction. 3. Mildly thickened appearance of the gastroesophageal junction may be related to underdistention. Clinical correlation is recommended to evaluate for gastritis or esophagitis. 4. Mild haziness of the mesentery with multiple mildly enlarged lymph nodes. This finding is nonspecific but may be related to underlying inflammatory/infectious etiology. A neoplastic process is not excluded. Correlation with history of known malignancy and follow-up recommended. 5. Aortic Atherosclerosis (ICD10-I70.0). Electronically Signed   By: Elgie Collard M.D.   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 Heart size is stable from prior study. Aortic calcifications are noted. There is some mild flattening at the left costophrenic angle which may represent atelectasis and/or a small left-sided effusion. There is no evidence for pneumothorax IMPRESSION: 1. No acute cardiopulmonary process. 2. Probable atelectasis at the left lung base. 3.  Aortic Atherosclerosis (ICD10-I70.0). Electronically Signed   By: Katherine Mantle M.D.   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 The remaining mesenteric vasculature is patent. Electronically Signed   By: Katherine Mantle M.D.   On: 05/19/2019 19:59   Result Date: 05/19/2019 CLINICAL DATA:  Abdominal pain and chest pain. EXAM: CT ANGIOGRAPHY CHEST, ABDOMEN AND PELVIS TECHNIQUE: Multidetector CT imaging through the chest, abdomen and pelvis was performed using the standard protocol during bolus administration of intravenous contrast. Multiplanar reconstructed images and MIPs were obtained and reviewed to evaluate the vascular anatomy. CONTRAST:  OMNIPAQUE IOHEXOL 350 MG/ML SOLN COMPARISON:  CT dated May 15, 2019 FINDINGS: CTA CHEST FINDINGS Cardiovascular: There is no evidence for thoracic aortic dissection or aneurysm. Mild atherosclerotic changes are noted of the thoracic aorta. The heart size is mildly enlarged. Coronary artery calcifications are noted. Right atrial enlargement is noted. 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 Mediastinum/Nodes: --No mediastinal or hilar lymphadenopathy. --No axillary lymphadenopathy. --No supraclavicular lymphadenopathy. --Normal thyroid gland. --The  esophagus is unremarkable Lungs/Pleura: There is a somewhat mosaic appearance of the lung parenchyma bilaterally. Evaluation of the lung fields is limited by extensive respiratory motion artifact. There is no large pleural effusion or pneumothorax. No definite focal infiltrate. There is some atelectasis at the lung bases. Musculoskeletal: No chest wall abnormality. No acute or significant osseous findings. There is mild chronic appearing height loss of the T6 vertebral body. Review of the MIP images confirms the above findings. CTA ABDOMEN AND PELVIS FINDINGS VASCULAR Aorta: Normal caliber aorta without aneurysm, dissection, vasculitis or significant stenosis. 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 There appears to be moderate narrowing of  the splenic and common hepatic arteries of unknown clinical significance. SMA: Patent without evidence of aneurysm, dissection, vasculitis or significant stenosis. Renals: Both renal arteries are patent without evidence of aneurysm, dissection, vasculitis, fibromuscular dysplasia or significant stenosis. IMA: Patent without evidence of aneurysm, dissection, vasculitis or significant stenosis. Inflow: Patent without evidence of aneurysm, dissection, vasculitis or significant stenosis. Veins: No obvious venous abnormality within the limitations of this arterial phase study. Review of the MIP images confirms the above findings. NON-VASCULAR Hepatobiliary: The liver is normal. Normal gallbladder.There is no biliary ductal dilation. Pancreas: Normal contours without ductal dilatation. No peripancreatic fluid collection. Spleen: No splenic laceration or hematoma. Adrenals/Urinary Tract: --Adrenal glands: No adrenal hemorrhage. --Right kidney/ureter: No hydronephrosis or perinephric hematoma. --Left kidney/ureter: There is a left-sided exophytic cyst measuring approximately 2.7 cm. 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 --Urinary bladder: Again noted are multiple stones in the dependent portion of the urinary bladder. Stomach/Bowel: --Stomach/Duodenum: There is a small to moderate-sized hiatal hernia. --Small bowel: No dilatation or inflammation. --Colon: Rectosigmoid diverticulosis without acute inflammation. --Appendix: Surgically absent. Lymphatic: --multiple enlarged retroperitoneal lymph nodes are noted. For example there is a 1.2 cm lymph node anterior to the aorta (axial series 6, image 122). --again noted are multiple enlarged mesenteric lymph nodes measuring up to approximately 2.1 cm in the short axis. --No pelvic or inguinal lymphadenopathy. Reproductive: Unremarkable Other: The patient is status post prior bilateral inguinal hernia repair. The abdominal wall is normal.  Musculoskeletal. No acute displaced fractures. A large Schmorl's node is noted involving the superior endplate of the L4 vertebral body. Review of the MIP images confirms the above findings. IMPRESSION: 1. No evidence of thoracic aortic dissection or aneurysm. 2. Persistent retroperitoneal and mesenteric adenopathy. This is of unknown clinical significance and may represent an infectious or inflammatory process. However, a view lymphoproliferative disease such as lymphoma is not excluded. Follow-up is recommended. 3. Mosaic appearance of the lung parenchyma may be secondary to small airways disease. 4. Cardiomegaly with right atrial enlargement. 5. Coronary artery calcifications. 6. Sigmoid diverticulosis without diverticulitis. 7. Multiple stones in the dependent portion of the urinary bladder. Aortic Atherosclerosis (ICD10-I70.0). Electronically Signed: By: Katherine Mantle M.D. On: 05/19/2019 19:55    Assessment and Plan:   MAREO PORTILLA is a 79 y.o. y/o male here to see me for abdominal CT scan of the abdomen that was performed when he presented to the emergency room with abdominal pain ongoing for 4 weeks.  CT scan of the abdomen showed mesenteric lymphadenopathy as well as thickening of his esophagus.  Has also had zoster affecting his abdomen.  History of constipation not had a bowel movement in 4 days.  History of a PEG in the past but have had the tube taken out as he has been taking  orally.  Denies any dysphagia or odynophagia but has had heartburn.  Plan 1.  Refer to Dr. Tasia Catchings in oncology to evaluate mesenteric lymphadenopathy.  Differentials include infectious versus malignancy such as lymphoma. 2.  EGD to evaluate thickened esophagus.  Pulmonary and cardiac clearance. 3.  Dose of magnesium citrate as he has not had a bowel movement in 4 days.  Once cleaned out commence on MiraLAX 1 capful twice a day. 4.  For breakthrough heartburn suggest to increase the Pepcid to 40 twice daily.  Cannot get  Prilosec as it interferes with the methotrexate and increase his attention in the body.  Continue Carafate.  I have discussed alternative options, risks & benefits,  which include, but are not limited to, bleeding, infection, perforation,respiratory complication & drug reaction.  The patient agrees with this plan & written consent will be obtained.     Dr Jonathon Bellows  MD,MRCP [facility]) Follow up in 2 weeks telephone visit

## 2019-05-20 NOTE — Telephone Encounter (Signed)
One day this week is good

## 2019-05-20 NOTE — Telephone Encounter (Signed)
Pt wife called to schedule Ed f/u apt pt is having abdominal swelling and having pain with eating and drinking his wife is worried it may be cancer  She would like for pt to be soon ASAP. Please call pt

## 2019-05-20 NOTE — Telephone Encounter (Signed)
Pt has been notified and scheduled.

## 2019-05-24 ENCOUNTER — Other Ambulatory Visit: Payer: Self-pay

## 2019-05-24 ENCOUNTER — Telehealth: Payer: Self-pay

## 2019-05-24 ENCOUNTER — Encounter: Payer: Self-pay | Admitting: Oncology

## 2019-05-24 DIAGNOSIS — R933 Abnormal findings on diagnostic imaging of other parts of digestive tract: Secondary | ICD-10-CM

## 2019-05-24 NOTE — Progress Notes (Signed)
Patient contacted for new pt visit. Chart reviewed and updated with wife Nile Dear), patient present. No concerns voiced.

## 2019-05-24 NOTE — Telephone Encounter (Signed)
Spoke with pt's spouse, Nile Dear, and informed her that pt has been cleared by his cardiologist to proceed with the upper endoscopy and that pt will need to hold the Eliquis (blood thinner) 2 days prior to the procedure. Pt's spouse agrees and we have scheduled pt's procedure.

## 2019-05-27 ENCOUNTER — Inpatient Hospital Stay: Payer: Medicare Other | Attending: Oncology | Admitting: Oncology

## 2019-05-27 ENCOUNTER — Other Ambulatory Visit: Payer: Self-pay

## 2019-05-27 ENCOUNTER — Inpatient Hospital Stay: Payer: Medicare Other

## 2019-05-27 ENCOUNTER — Encounter: Payer: Self-pay | Admitting: *Deleted

## 2019-05-27 VITALS — BP 110/75 | HR 70 | Temp 97.0°F | Resp 18 | Wt 220.0 lb

## 2019-05-27 DIAGNOSIS — Z87891 Personal history of nicotine dependence: Secondary | ICD-10-CM | POA: Diagnosis not present

## 2019-05-27 DIAGNOSIS — R12 Heartburn: Secondary | ICD-10-CM | POA: Diagnosis not present

## 2019-05-27 DIAGNOSIS — I119 Hypertensive heart disease without heart failure: Secondary | ICD-10-CM | POA: Insufficient documentation

## 2019-05-27 DIAGNOSIS — M069 Rheumatoid arthritis, unspecified: Secondary | ICD-10-CM | POA: Insufficient documentation

## 2019-05-27 DIAGNOSIS — R531 Weakness: Secondary | ICD-10-CM | POA: Insufficient documentation

## 2019-05-27 DIAGNOSIS — I69354 Hemiplegia and hemiparesis following cerebral infarction affecting left non-dominant side: Secondary | ICD-10-CM | POA: Insufficient documentation

## 2019-05-27 DIAGNOSIS — N281 Cyst of kidney, acquired: Secondary | ICD-10-CM | POA: Diagnosis not present

## 2019-05-27 DIAGNOSIS — J449 Chronic obstructive pulmonary disease, unspecified: Secondary | ICD-10-CM | POA: Insufficient documentation

## 2019-05-27 DIAGNOSIS — R1010 Upper abdominal pain, unspecified: Secondary | ICD-10-CM | POA: Diagnosis not present

## 2019-05-27 DIAGNOSIS — K5900 Constipation, unspecified: Secondary | ICD-10-CM | POA: Insufficient documentation

## 2019-05-27 DIAGNOSIS — K219 Gastro-esophageal reflux disease without esophagitis: Secondary | ICD-10-CM | POA: Insufficient documentation

## 2019-05-27 DIAGNOSIS — Z7951 Long term (current) use of inhaled steroids: Secondary | ICD-10-CM | POA: Diagnosis not present

## 2019-05-27 DIAGNOSIS — R0789 Other chest pain: Secondary | ICD-10-CM | POA: Diagnosis not present

## 2019-05-27 DIAGNOSIS — F419 Anxiety disorder, unspecified: Secondary | ICD-10-CM | POA: Insufficient documentation

## 2019-05-27 DIAGNOSIS — Z794 Long term (current) use of insulin: Secondary | ICD-10-CM | POA: Insufficient documentation

## 2019-05-27 DIAGNOSIS — I4891 Unspecified atrial fibrillation: Secondary | ICD-10-CM | POA: Diagnosis not present

## 2019-05-27 DIAGNOSIS — E785 Hyperlipidemia, unspecified: Secondary | ICD-10-CM | POA: Insufficient documentation

## 2019-05-27 DIAGNOSIS — R5383 Other fatigue: Secondary | ICD-10-CM | POA: Insufficient documentation

## 2019-05-27 DIAGNOSIS — R590 Localized enlarged lymph nodes: Secondary | ICD-10-CM | POA: Insufficient documentation

## 2019-05-27 DIAGNOSIS — Z79899 Other long term (current) drug therapy: Secondary | ICD-10-CM | POA: Insufficient documentation

## 2019-05-27 DIAGNOSIS — R591 Generalized enlarged lymph nodes: Secondary | ICD-10-CM

## 2019-05-27 DIAGNOSIS — E119 Type 2 diabetes mellitus without complications: Secondary | ICD-10-CM | POA: Insufficient documentation

## 2019-05-27 DIAGNOSIS — I774 Celiac artery compression syndrome: Secondary | ICD-10-CM

## 2019-05-27 DIAGNOSIS — I771 Stricture of artery: Secondary | ICD-10-CM

## 2019-05-27 LAB — CBC WITH DIFFERENTIAL/PLATELET
Abs Immature Granulocytes: 0.05 10*3/uL (ref 0.00–0.07)
Basophils Absolute: 0.1 10*3/uL (ref 0.0–0.1)
Basophils Relative: 1 %
Eosinophils Absolute: 0.5 10*3/uL (ref 0.0–0.5)
Eosinophils Relative: 5 %
HCT: 39.3 % (ref 39.0–52.0)
Hemoglobin: 13 g/dL (ref 13.0–17.0)
Immature Granulocytes: 1 %
Lymphocytes Relative: 32 %
Lymphs Abs: 2.9 10*3/uL (ref 0.7–4.0)
MCH: 31.6 pg (ref 26.0–34.0)
MCHC: 33.1 g/dL (ref 30.0–36.0)
MCV: 95.6 fL (ref 80.0–100.0)
Monocytes Absolute: 0.9 10*3/uL (ref 0.1–1.0)
Monocytes Relative: 10 %
Neutro Abs: 4.7 10*3/uL (ref 1.7–7.7)
Neutrophils Relative %: 51 %
Platelets: 285 10*3/uL (ref 150–400)
RBC: 4.11 MIL/uL — ABNORMAL LOW (ref 4.22–5.81)
RDW: 15 % (ref 11.5–15.5)
WBC: 9 10*3/uL (ref 4.0–10.5)
nRBC: 0 % (ref 0.0–0.2)

## 2019-05-27 LAB — TECHNOLOGIST SMEAR REVIEW

## 2019-05-27 LAB — COMPREHENSIVE METABOLIC PANEL
ALT: 43 U/L (ref 0–44)
AST: 37 U/L (ref 15–41)
Albumin: 3.8 g/dL (ref 3.5–5.0)
Alkaline Phosphatase: 60 U/L (ref 38–126)
Anion gap: 6 (ref 5–15)
BUN: 14 mg/dL (ref 8–23)
CO2: 24 mmol/L (ref 22–32)
Calcium: 8.8 mg/dL — ABNORMAL LOW (ref 8.9–10.3)
Chloride: 105 mmol/L (ref 98–111)
Creatinine, Ser: 0.88 mg/dL (ref 0.61–1.24)
GFR calc Af Amer: >60 mL/min (ref >60)
GFR calc non Af Amer: >60 mL/min (ref >60)
Glucose, Bld: 139 mg/dL — ABNORMAL HIGH (ref 70–99)
Potassium: 3.7 mmol/L (ref 3.5–5.1)
Sodium: 135 mmol/L (ref 135–145)
Total Bilirubin: 0.7 mg/dL (ref 0.3–1.2)
Total Protein: 7.1 g/dL (ref 6.5–8.1)

## 2019-05-27 LAB — LACTATE DEHYDROGENASE: LDH: 164 U/L (ref 98–192)

## 2019-05-27 NOTE — Progress Notes (Signed)
Patient mentions his stomach hurts and gets worse after he eats, wife says sharp pain

## 2019-05-27 NOTE — Progress Notes (Signed)
Hematology/Oncology Consult note [facility] Telephone:([phone] [phone]   Patient Care Team: Ezequiel Kayser, MD as PCP - General (Internal Medicine)  REFERRING PROVIDER: Jonathon Bellows, MD  CHIEF COMPLAINTS/REASON FOR VISIT:  Evaluation of abnormal CT scan  HISTORY OF PRESENTING ILLNESS:   Oscar Brock is a  79 y.o.  male with PMH listed below was seen in consultation at the request of  Jonathon Bellows, MD  for evaluation of abnormal CT scan   05/15/2019 patient presented to emergency room for evaluation of chest and abdominal pain. CT abdomen pelvis showed no acute intra-abdominal or pelvic pathology.  Sigmoid diverticulosis.  No obstruction. Mildly thickened appearance of gastro esophageal junction. Mild haziness of the mesentery with multiple mildly enlarged lymph nodes.  Findings are nonspecific, questionable inflammatory/infectious etiology.  Neoplastic process is not excluded.  Patient presented to emergency room on 05/19/2019 due to chest pain, left upper anterior chest wall, no exacerbating or alleviating factors.  Associated with shortness of breath.  CT chest abdomen pelvis angiogram was obtained for concern of aortic dissection.  Study showed persistent retroperitoneal and mesenteric adenopathy.  This is of unknown clinical significance.  Mosaic appearance of lung parenchyma.  Cardiomegaly with right atrial enlargement, CAD, sigmoid diverticulosis without diverticulitis.  Multiple stones in the dependent portion of the urinary bladder. 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  Patient was recently seen by gastroenterology Dr. Vicente Males for abdominal pain. He has a history of PEG tube in the past, and a PEG tube was taken out.Marland Kitchen  EGD was planned for evaluation of thickened esophagus.  Today patient was accompanied by his wife.  Patient reports intermittent left chest wall and abdomen pain, bandlike, usually started after eating lunch.  Takes pain  medication with some relief. Patient has stroke with residual left side weakness.  Review of Systems  Constitutional: Positive for fatigue. Negative for unexpected weight change.  HENT:   Negative for voice change.   Cardiovascular: Positive for chest pain.  Gastrointestinal: Positive for abdominal pain and constipation. Negative for blood in stool.  Genitourinary: Negative for dysuria.   Musculoskeletal:       Shoulder blade pain  Skin:       Recent zoster infection on his abdomen  Neurological: Positive for extremity weakness.  Psychiatric/Behavioral: Negative for confusion.    MEDICAL HISTORY:  Past Medical History:  Diagnosis Date  . Anxiety   . Arthritis   . Atrial fibrillation ([place])   . Carotid stenosis, symptomatic, with infarction ([place]) 02/13/2017  . COPD (chronic obstructive pulmonary disease) ([place])   . Diabetes mellitus without complication ([place])   . GERD (gastroesophageal reflux disease)   . HTN (hypertension) 04/11/2017  . Hyperlipidemia 02/18/2009  . Hypertension   . Kidney stones   . Left hemiplegia ([place]) 04/25/2017  . Stroke ([place])   . Tremor 07/12/2016    SURGICAL HISTORY: Past Surgical History:  Procedure Laterality Date  . APPENDECTOMY    . ESOPHAGOGASTRODUODENOSCOPY    . HERNIA REPAIR    . PACEMAKER INSERTION    . PEG PLACEMENT N/A 09/26/2017   Procedure: PERCUTANEOUS ENDOSCOPIC GASTROSTOMY (PEG) PLACEMENT;  Surgeon: Lucilla Lame, MD;  Location: [facility] ENDOSCOPY;  Service: Endoscopy;  Laterality: N/A;  . PEG TUBE PLACEMENT      SOCIAL HISTORY: Social History   Socioeconomic History  . Marital status: Married    Spouse name: Not on file  . Number of children: Not on file  . Years of education: Not on  file  . Highest education level: Not on file  Occupational History  . Not on file  Tobacco Use  . Smoking status: Former Smoker    Quit date: 1989    Years since quitting: 32.2  . Smokeless tobacco: Never Used  Substance and Sexual Activity  .  Alcohol use: No  . Drug use: No  . Sexual activity: Not on file  Other Topics Concern  . Not on file  Social History Narrative  . Not on file   Social Determinants of Health   Financial Resource Strain:   . Difficulty of Paying Living Expenses:   Food Insecurity:   . Worried About Charity fundraiser in the Last Year:   . Arboriculturist in the Last Year:   Transportation Needs:   . Film/video editor (Medical):   Marland Kitchen Lack of Transportation (Non-Medical):   Physical Activity:   . Days of Exercise per Week:   . Minutes of Exercise per Session:   Stress:   . Feeling of Stress :   Social Connections:   . Frequency of Communication with Friends and Family:   . Frequency of Social Gatherings with Friends and Family:   . Attends Religious Services:   . Active Member of Clubs or Organizations:   . Attends Archivist Meetings:   Marland Kitchen Marital Status:   Intimate Partner Violence:   . Fear of Current or Ex-Partner:   . Emotionally Abused:   Marland Kitchen Physically Abused:   . Sexually Abused:     FAMILY HISTORY: Family History  Problem Relation Age of Onset  . Heart failure Mother   . Clotting disorder Mother   . Heart failure Father   . Hypertension Father   . Diabetes Father   . Stroke Brother   . Lung cancer Sister     ALLERGIES:  is allergic to effexor xr [venlafaxine hcl er]; lisinopril; other; oxycodone-acetaminophen; budesonide-formoterol fumarate; fluticasone; and propoxyphene.  MEDICATIONS:  Current Outpatient Medications  Medication Sig Dispense Refill  . acetaminophen (TYLENOL) 325 MG tablet Take 650 mg by mouth every 6 (six) hours as needed for mild pain or fever.     Marland Kitchen albuterol (PROVENTIL) (2.5 MG/3ML) 0.083% nebulizer solution Take 2.5 mg by nebulization every 6 (six) hours as needed for wheezing or shortness of breath.     Marland Kitchen albuterol (VENTOLIN HFA) 108 (90 Base) MCG/ACT inhaler Inhale 2 puffs into the lungs every 4 (four) hours as needed for wheezing or  shortness of breath.    . ALPRAZolam (XANAX) 0.25 MG tablet Take 0.125 mg by mouth daily as needed for anxiety.     Marland Kitchen apixaban (ELIQUIS) 5 MG TABS tablet Take 5 mg by mouth 2 (two) times daily.    . Artificial Saliva (BIOTENE DRY MOUTH MOISTURIZING) SOLN Take 1 spray by mouth every 2 (two) hours as needed (dry mouth).     Marland Kitchen aspirin EC 81 MG tablet Take 81 mg by mouth daily.     Marland Kitchen atorvastatin (LIPITOR) 40 MG tablet Take 40 mg by mouth daily.     . bisacodyl (DULCOLAX) 5 MG EC tablet Take 5 mg by mouth daily as needed for mild constipation.     . diclofenac Sodium (VOLTAREN) 1 % GEL Apply 2 g topically daily as needed (pain).    . DULoxetine (CYMBALTA) 30 MG capsule Take 30 mg by mouth daily.    . famotidine (PEPCID) 40 MG tablet Take 1 tablet (40 mg total) by mouth 2 (  two) times daily. 180 tablet 1  . finasteride (PROSCAR) 5 MG tablet Take 5 mg by mouth daily.     . fluticasone furoate-vilanterol (BREO ELLIPTA) 100-25 MCG/INH AEPB Inhale 1 puff into the lungs daily.    . folic acid (FOLVITE) 1 MG tablet Take 1 mg by mouth daily.    Marland Kitchen gabapentin (NEURONTIN) 300 MG capsule Take 300 mg by mouth daily.     Marland Kitchen HUMALOG KWIKPEN 100 UNIT/ML KiwkPen Inject 1-3 Units into the skin every evening.     . Insulin Detemir (LEVEMIR FLEXTOUCH) 100 UNIT/ML Pen Inject 20 Units into the skin daily.     Marland Kitchen ipratropium (ATROVENT) 0.02 % nebulizer solution Take 0.5 mg by nebulization 4 (four) times daily as needed for wheezing or shortness of breath.     . levothyroxine (SYNTHROID) 125 MCG tablet Take 125 mcg by mouth daily.     . Methotrexate Sodium (METHOTREXATE, PF,) 200 MG/8ML injection Inject 15 mg into the skin every Monday.     . metoprolol succinate (TOPROL-XL) 25 MG 24 hr tablet Take 25 mg by mouth 2 (two) times daily.    . mirtazapine (REMERON) 15 MG tablet Take 15 mg by mouth at bedtime.     . senna-docusate (SENOKOT-S) 8.6-50 MG tablet Take 2 tablets by mouth daily.     . sodium chloride HYPERTONIC 3 %  nebulizer solution Take 4 mLs by nebulization 2 (two) times daily as needed for other or cough.     . sucralfate (CARAFATE) 1 g tablet Take 1 tablet (1 g total) by mouth 4 (four) times daily for 10 days. 40 tablet 0  . traMADol (ULTRAM) 50 MG tablet Take 50 mg by mouth 2 (two) times daily as needed for pain.     No current facility-administered medications for this visit.     PHYSICAL EXAMINATION: ECOG PERFORMANCE STATUS: 3 - Symptomatic, >50% confined to bed Vitals:   05/27/19 1054  BP: 110/75  Pulse: 70  Resp: 18  Temp: (!) 97 F (36.1 C)  SpO2: 100%   Filed Weights   05/27/19 1054  Weight: 220 lb (99.8 kg)    Physical Exam Constitutional:      General: He is not in acute distress.    Appearance: He is ill-appearing.     Comments: Patient states in the wheelchair.  HENT:     Head: Normocephalic and atraumatic.  Eyes:     General: No scleral icterus. Cardiovascular:     Rate and Rhythm: Normal rate and regular rhythm.     Heart sounds: Normal heart sounds.  Pulmonary:     Effort: Pulmonary effort is normal. No respiratory distress.     Breath sounds: No wheezing.  Abdominal:     General: Bowel sounds are normal. There is no distension.     Palpations: Abdomen is soft.  Musculoskeletal:        General: No deformity.     Cervical back: Normal range of motion and neck supple.     Comments: Chronic left side weakness, LUE and LLE 0/5 RUE and RLE 5/5  Skin:    General: Skin is warm and dry.  Neurological:     Mental Status: He is alert. Mental status is at baseline.  Psychiatric:        Mood and Affect: Mood normal.     LABORATORY DATA:  I have reviewed the data as listed Lab Results  Component Value Date   WBC 9.0 05/27/2019   HGB 13.0 05/27/2019  HCT 39.3 05/27/2019   MCV 95.6 05/27/2019   PLT 285 05/27/2019   Recent Labs    05/15/19 1604 05/19/19 1722 05/27/19 1133  NA 134* 131* 135  K 4.9 4.9 3.7  CL 101 97* 105  CO2 20* 26 24  GLUCOSE 165*  153* 139*  BUN 18 12 14   CREATININE 0.88 0.84 0.88  CALCIUM 9.2 9.3 8.8*  GFRNONAA >60 >60 >60  GFRAA >60 >60 >60  PROT 7.7 7.8 7.1  ALBUMIN 4.0 4.3 3.8  AST 33 29 37  ALT 28 29 43  ALKPHOS 61 64 60  BILITOT 1.1 0.9 0.7   Iron/TIBC/Ferritin/ %Sat No results found for: IRON, TIBC, FERRITIN, IRONPCTSAT    RADIOGRAPHIC STUDIES: I have personally reviewed the radiological images as listed and agreed with the findings in the report. CT ABDOMEN PELVIS W CONTRAST  Result Date: 05/15/2019 CLINICAL DATA:  79 year old male with left-sided abdominal pain. EXAM: CT ABDOMEN AND PELVIS WITH CONTRAST TECHNIQUE: Multidetector CT imaging of the abdomen and pelvis was performed using the standard protocol following bolus administration of intravenous contrast. CONTRAST:  160m OMNIPAQUE IOHEXOL 300 MG/ML  SOLN COMPARISON:  CT abdomen pelvis dated 01/27/2011. FINDINGS: Lower chest: The visualized lung bases are clear. Multi vessel coronary vascular calcification and partially visualized pacemaker wire. Mild biatrial dilatation. No intra-abdominal free air or free fluid. Hepatobiliary: Mild irregularity of the liver contour may represent early changes of cirrhosis. Clinical correlation is recommended. No intrahepatic biliary ductal dilatation. The gallbladder is unremarkable. Pancreas: Mildly atrophic pancreas. No active inflammatory changes. No dilatation of the main pancreatic duct. Spleen: Normal in size without focal abnormality. Adrenals/Urinary Tract: The adrenal glands are unremarkable. There is no hydronephrosis on either side. There is symmetric enhancement and excretion of contrast by both kidneys. There is a 2 cm left anterior interpolar exophytic cyst. The visualized ureters are unremarkable. Multiple small stones noted layering along the posterior bladder wall. Stomach/Bowel: There is sigmoid diverticulosis without active inflammatory changes. There is a small hiatal hernia. Mildly thickened appearance  of the gastroesophageal junction may be related to underdistention. Clinical correlation is recommended to evaluate for gastritis or esophagitis. There is no bowel obstruction. Appendectomy. Vascular/Lymphatic: Moderate aortoiliac atherosclerotic disease. The IVC is unremarkable. No portal venous gas. There is mild haziness of the mesentery with multiple mildly enlarged lymph nodes with a "misty mesentery" appearance. This finding is nonspecific but may be related to underlying inflammatory/infectious etiology. A neoplastic process is not excluded. Correlation with history of known malignancy and follow-up recommended. Reproductive: The prostate and seminal vesicles are grossly unremarkable. Other: None Musculoskeletal: Osteopenia with degenerative changes of the spine. No acute osseous pathology. IMPRESSION: 1. No acute intra-abdominal or pelvic pathology. 2. Sigmoid diverticulosis. No bowel obstruction. 3. Mildly thickened appearance of the gastroesophageal junction may be related to underdistention. Clinical correlation is recommended to evaluate for gastritis or esophagitis. 4. Mild haziness of the mesentery with multiple mildly enlarged lymph nodes. This finding is nonspecific but may be related to underlying inflammatory/infectious etiology. A neoplastic process is not excluded. Correlation with history of known malignancy and follow-up recommended. 5. Aortic Atherosclerosis (ICD10-I70.0). Electronically Signed   By: AAnner CreteM.D.   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 Heart size is stable from prior study. Aortic calcifications are noted. There is some mild  flattening at the left costophrenic angle which may represent atelectasis and/or a small left-sided effusion. There is no evidence for pneumothorax IMPRESSION: 1. No  acute cardiopulmonary process. 2. Probable atelectasis at the left lung base. 3.  Aortic Atherosclerosis (ICD10-I70.0). Electronically Signed   By: Constance Holster M.D.   On: 05/15/2019 16:16   CT Angio Chest/Abd/Pel for Dissection W and/or Wo Contrast  Addendum Date: 05/19/2019   ADDENDUM REPORT: 05/19/2019 19:59 ADDENDUM: There is severe stenosis at the origin of the celiac axis. The remaining mesenteric vasculature is patent. Electronically Signed   By: Constance Holster M.D.   On: 05/19/2019 19:59   Result Date: 05/19/2019 CLINICAL DATA:  Abdominal pain and chest pain. EXAM: CT ANGIOGRAPHY CHEST, ABDOMEN AND PELVIS TECHNIQUE: Multidetector CT imaging through the chest, abdomen and pelvis was performed using the standard protocol during bolus administration of intravenous contrast. Multiplanar reconstructed images and MIPs were obtained and reviewed to evaluate the vascular anatomy. CONTRAST:  117m OMNIPAQUE IOHEXOL 350 MG/ML SOLN COMPARISON:  CT dated May 15, 2019 FINDINGS: CTA CHEST FINDINGS Cardiovascular: There is no evidence for thoracic aortic dissection or aneurysm. Mild atherosclerotic changes are noted of the thoracic aorta. The heart size is mildly enlarged. Coronary artery calcifications are noted. Right atrial enlargement is noted. 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 Mediastinum/Nodes: --No mediastinal or hilar lymphadenopathy. --No axillary lymphadenopathy. --No supraclavicular lymphadenopathy. --Normal thyroid gland. --The esophagus is unremarkable Lungs/Pleura: There is a somewhat mosaic appearance of the lung parenchyma bilaterally. Evaluation of the lung fields is limited by extensive respiratory motion artifact. There is no large pleural effusion or pneumothorax. No definite focal infiltrate. There is some atelectasis at the lung bases. Musculoskeletal: No chest wall abnormality. No acute or significant osseous findings. There is  mild chronic appearing height loss of the T6 vertebral body. Review of the MIP images confirms the above findings. CTA ABDOMEN AND PELVIS FINDINGS VASCULAR Aorta: Normal caliber aorta without aneurysm, dissection, vasculitis or significant stenosis. 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 There appears to be moderate narrowing of the splenic and common hepatic arteries of unknown clinical significance. SMA: Patent without evidence of aneurysm, dissection, vasculitis or significant stenosis. Renals: Both renal arteries are patent without evidence of aneurysm, dissection, vasculitis, fibromuscular dysplasia or significant stenosis. IMA: Patent without evidence of aneurysm, dissection, vasculitis or significant stenosis. Inflow: Patent without evidence of aneurysm, dissection, vasculitis or significant stenosis. Veins: No obvious venous abnormality within the limitations of this arterial phase study. Review of the MIP images confirms the above findings. NON-VASCULAR Hepatobiliary: The liver is normal. Normal gallbladder.There is no biliary ductal dilation. Pancreas: Normal contours without ductal dilatation. No peripancreatic fluid collection. Spleen: No splenic laceration or hematoma. Adrenals/Urinary Tract: --Adrenal glands: No adrenal hemorrhage. --Right kidney/ureter: No hydronephrosis or perinephric hematoma. --Left kidney/ureter: There is a left-sided exophytic cyst measuring approximately 2.7 cm. 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 --Urinary bladder: Again noted are multiple stones in the dependent portion of the urinary bladder. Stomach/Bowel: --Stomach/Duodenum: There is a small to moderate-sized hiatal hernia. --Small bowel: No dilatation or inflammation. --Colon: Rectosigmoid diverticulosis without acute inflammation. --Appendix: Surgically absent. Lymphatic: --multiple enlarged retroperitoneal lymph nodes are noted. For  example there is a 1.2 cm lymph node anterior to the aorta (axial series 6, image 122). --again noted are multiple enlarged mesenteric lymph nodes measuring up to approximately 2.1 cm in the short axis. --No pelvic  or inguinal lymphadenopathy. Reproductive: Unremarkable Other: The patient is status post prior bilateral inguinal hernia repair. The abdominal wall is normal. Musculoskeletal. No acute displaced fractures. A large Schmorl's node is noted involving the superior endplate of the L4 vertebral body. Review of the MIP images confirms the above findings. IMPRESSION: 1. No evidence of thoracic aortic dissection or aneurysm. 2. Persistent retroperitoneal and mesenteric adenopathy. This is of unknown clinical significance and may represent an infectious or inflammatory process. However, a view lymphoproliferative disease such as lymphoma is not excluded. Follow-up is recommended. 3. Mosaic appearance of the lung parenchyma may be secondary to small airways disease. 4. Cardiomegaly with right atrial enlargement. 5. Coronary artery calcifications. 6. Sigmoid diverticulosis without diverticulitis. 7. Multiple stones in the dependent portion of the urinary bladder. Aortic Atherosclerosis (ICD10-I70.0). Electronically Signed: By: Constance Holster M.D. On: 05/19/2019 19:55      ASSESSMENT & PLAN:  1. Lymphadenopathy   2. Celiac artery stenosis (HCC)   3. Pain of upper abdomen   4. Heartburn   5. Cyst of left kidney    #Multiple enlarged retroperitoneal lymph node, as well as mesenteric lymphadenopath,y Etiology unknown. Reactive secondary to acute/chronic inflammation or infection versus lymphoproliferative disease. I recommend checking CBC, smear, flow cytometry, SPEP, LDH. Patient will proceed with endoscopy for evaluation of esophageal thickening next week with Dr. Vicente Males. He has been referred to establish care with vascular surgeon for evaluation of celiac artery stenosis  Patient has chronic  intermittent abdominal pain after meals, possible secondary to celiac axis stenosis, questionable celiac artery compression syndrome.  Appreciate vascular surgeon input. Wonder if lymphadenopathy is secondary to chronic ischemia due to celiac artery compression.  Heartburn, Pepcid and Carafate. Left kidney cyst Rheumatoid arthritis, patient has been on methotrexate weekly.  Orders Placed This Encounter  Procedures  . Comprehensive metabolic panel    Standing Status:   Future    Number of Occurrences:   1    Standing Expiration Date:   11/26/2020  . Flow cytometry panel-leukemia/lymphoma work-up    Standing Status:   Future    Number of Occurrences:   1    Standing Expiration Date:   11/26/2020  . CBC with Differential/Platelet    Standing Status:   Future    Number of Occurrences:   1    Standing Expiration Date:   05/26/2020  . Technologist smear review    Standing Status:   Future    Number of Occurrences:   1    Standing Expiration Date:   05/26/2020  . Multiple Myeloma Panel (SPEP&IFE w/QIG)    Standing Status:   Future    Number of Occurrences:   1    Standing Expiration Date:   11/26/2020  . Kappa/lambda light chains    Standing Status:   Future    Number of Occurrences:   1    Standing Expiration Date:   11/26/2020  . Lactate dehydrogenase    Standing Status:   Future    Number of Occurrences:   1    Standing Expiration Date:   05/26/2020    All questions were answered. The patient knows to call the clinic with any problems questions or concerns.  cc Jonathon Bellows, MD    Return of visit: 2 weeks Thank you for this kind referral and the opportunity to participate in the care of this patient. A copy of today's note is routed to referring provider    Earlie Server, MD, PhD Hematology [place]  [facility]- [phone] 05/27/2019

## 2019-05-29 LAB — MULTIPLE MYELOMA PANEL, SERUM
Albumin SerPl Elph-Mcnc: 3.8 g/dL (ref 2.9–4.4)
Albumin/Glob SerPl: 1.4 (ref 0.7–1.7)
Alpha 1: 0.2 g/dL (ref 0.0–0.4)
Alpha2 Glob SerPl Elph-Mcnc: 0.8 g/dL (ref 0.4–1.0)
B-Globulin SerPl Elph-Mcnc: 1.1 g/dL (ref 0.7–1.3)
Gamma Glob SerPl Elph-Mcnc: 0.9 g/dL (ref 0.4–1.8)
Globulin, Total: 2.9 g/dL (ref 2.2–3.9)
IgA: 366 mg/dL (ref 61–437)
IgG (Immunoglobin G), Serum: 919 mg/dL (ref 603–1613)
IgM (Immunoglobulin M), Srm: 86 mg/dL (ref 15–143)
Total Protein ELP: 6.7 g/dL (ref 6.0–8.5)

## 2019-05-30 ENCOUNTER — Ambulatory Visit (INDEPENDENT_AMBULATORY_CARE_PROVIDER_SITE_OTHER): Payer: Medicare Other | Admitting: Vascular Surgery

## 2019-05-30 ENCOUNTER — Other Ambulatory Visit: Payer: Self-pay

## 2019-05-30 ENCOUNTER — Encounter (INDEPENDENT_AMBULATORY_CARE_PROVIDER_SITE_OTHER): Payer: Self-pay | Admitting: Vascular Surgery

## 2019-05-30 VITALS — BP 102/70 | HR 82 | Resp 15 | Ht 75.0 in

## 2019-05-30 DIAGNOSIS — I63239 Cerebral infarction due to unspecified occlusion or stenosis of unspecified carotid arteries: Secondary | ICD-10-CM | POA: Diagnosis not present

## 2019-05-30 DIAGNOSIS — I2510 Atherosclerotic heart disease of native coronary artery without angina pectoris: Secondary | ICD-10-CM | POA: Diagnosis not present

## 2019-05-30 DIAGNOSIS — J439 Emphysema, unspecified: Secondary | ICD-10-CM

## 2019-05-30 DIAGNOSIS — E1151 Type 2 diabetes mellitus with diabetic peripheral angiopathy without gangrene: Secondary | ICD-10-CM

## 2019-05-30 DIAGNOSIS — I771 Stricture of artery: Secondary | ICD-10-CM

## 2019-05-30 DIAGNOSIS — I10 Essential (primary) hypertension: Secondary | ICD-10-CM | POA: Diagnosis not present

## 2019-05-30 DIAGNOSIS — E782 Mixed hyperlipidemia: Secondary | ICD-10-CM

## 2019-05-30 DIAGNOSIS — I774 Celiac artery compression syndrome: Secondary | ICD-10-CM | POA: Diagnosis not present

## 2019-06-04 ENCOUNTER — Telehealth (INDEPENDENT_AMBULATORY_CARE_PROVIDER_SITE_OTHER): Payer: Self-pay

## 2019-06-04 DIAGNOSIS — I774 Celiac artery compression syndrome: Secondary | ICD-10-CM | POA: Insufficient documentation

## 2019-06-04 DIAGNOSIS — I771 Stricture of artery: Secondary | ICD-10-CM | POA: Insufficient documentation

## 2019-06-04 NOTE — Telephone Encounter (Signed)
Spoke with the patient's wife and he is now scheduled with Dr. Gilda Crease for a Celiac artery stent placement on 06/11/19 with a 10:00 am arrival time to the [facility]. Patient will do covid testing on 06/06/19 through another office. Pre-procedure instructions were discussed and will be mailed to the patient.

## 2019-06-04 NOTE — Progress Notes (Signed)
MRN : [id number]  Oscar Brock is a 79 y.o. (1940-11-21) male who presents with chief complaint of  Chief Complaint  Patient presents with  . New Patient (Initial Visit)    ref Thies abdominal pain  .  History of Present Illness:   I am asked to evaluate the patient for the complaint of abdominal pain with uncertain etiology.  The patient is followed by primary care who is concerned about mesenteric ischemia.  The patient has not noted any weight loss but does have nausea.  The patient does substantiate food fear, particular foods do not seem to aggravate or alleviate the symptoms.  The patient denies bloody bowel movements or diarrhea.  The patient has a history of colonoscopy which was not diagnostic.  No history of peptic ulcer disease.   No prior peripheral angiograms or vascular interventions.  The patient denies amaurosis fugax or recent TIA symptoms. There are no recent neurological changes noted. The patient denies claudication symptoms or rest pain symptoms. The patient denies history of DVT, PE or superficial thrombophlebitis. 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  SMA is patent.   Current Meds  Medication Sig  . acetaminophen (TYLENOL) 325 MG tablet Take 650 mg by mouth every 6 (six) hours as needed for mild pain or fever.   Marland Kitchen albuterol (PROVENTIL) (2.5 MG/3ML) 0.083% nebulizer solution Take 2.5 mg by nebulization every 6 (six) hours as needed for wheezing or shortness of breath.   Marland Kitchen albuterol (VENTOLIN HFA) 108 (90 Base) MCG/ACT inhaler Inhale 2 puffs into the lungs every 4 (four) hours as needed for wheezing or shortness of breath.  . ALPRAZolam (XANAX) 0.25 MG tablet Take 0.125 mg by mouth daily as needed for anxiety.   Marland Kitchen apixaban (ELIQUIS) 5 MG TABS tablet Take 5 mg by mouth 2 (two) times daily.  . Artificial Saliva  (BIOTENE DRY MOUTH MOISTURIZING) SOLN Take 1 spray by mouth every 2 (two) hours as needed (dry mouth).   Marland Kitchen aspirin EC 81 MG tablet Take 81 mg by mouth daily.   Marland Kitchen atorvastatin (LIPITOR) 40 MG tablet Take 40 mg by mouth daily.   . bisacodyl (DULCOLAX) 5 MG EC tablet Take 5 mg by mouth daily as needed for mild constipation.   . diclofenac Sodium (VOLTAREN) 1 % GEL Apply 2 g topically daily as needed (pain).  . DULoxetine (CYMBALTA) 30 MG capsule Take 30 mg by mouth daily.  . famotidine (PEPCID) 40 MG tablet Take 1 tablet (40 mg total) by mouth 2 (two) times daily.  . finasteride (PROSCAR) 5 MG tablet Take 5 mg by mouth daily.   . fluticasone furoate-vilanterol (BREO ELLIPTA) 100-25 MCG/INH AEPB Inhale 1 puff into the lungs daily.  . folic acid (FOLVITE) 1 MG tablet Take 1 mg by mouth daily.  Marland Kitchen gabapentin (NEURONTIN) 300 MG capsule Take 300 mg by mouth daily.   Marland Kitchen HUMALOG KWIKPEN 100 UNIT/ML KiwkPen Inject 1-3 Units into the skin every evening.   . Insulin Detemir (LEVEMIR FLEXTOUCH) 100 UNIT/ML Pen Inject 20 Units into the skin daily.   Marland Kitchen ipratropium (ATROVENT) 0.02 % nebulizer solution Take 0.5 mg by nebulization 4 (four) times daily as needed for wheezing or shortness of breath.   . levothyroxine (SYNTHROID) 125 MCG tablet Take 125 mcg by mouth daily.   . Methotrexate Sodium (METHOTREXATE,  PF,) 200 MG/8ML injection Inject 15 mg into the skin every Monday.   . metoprolol succinate (TOPROL-XL) 25 MG 24 hr tablet Take 25 mg by mouth 2 (two) times daily.  . mirtazapine (REMERON) 15 MG tablet Take 15 mg by mouth at bedtime.   . senna-docusate (SENOKOT-S) 8.6-50 MG tablet Take 2 tablets by mouth daily.   . sodium chloride HYPERTONIC 3 % nebulizer solution Take 4 mLs by nebulization 2 (two) times daily as needed for other or cough.   . traMADol (ULTRAM) 50 MG tablet Take 50 mg by mouth 2 (two) times daily as needed for pain.    Past Medical History:  Diagnosis Date  . Anxiety   . Arthritis   .  Atrial fibrillation ([place])   . Carotid stenosis, symptomatic, with infarction ([place]) 02/13/2017  . COPD (chronic obstructive pulmonary disease) ([place])   . Diabetes mellitus without complication ([place])   . GERD (gastroesophageal reflux disease)   . HTN (hypertension) 04/11/2017  . Hyperlipidemia 02/18/2009  . Hypertension   . Kidney stones   . Left hemiplegia ([place]) 04/25/2017  . Stroke ([place])   . Tremor 07/12/2016    Past Surgical History:  Procedure Laterality Date  . APPENDECTOMY    . ESOPHAGOGASTRODUODENOSCOPY    . HERNIA REPAIR    . PACEMAKER INSERTION    . PEG PLACEMENT N/A 09/26/2017   Procedure: PERCUTANEOUS ENDOSCOPIC GASTROSTOMY (PEG) PLACEMENT;  Surgeon: Lucilla Lame, MD;  Location: [facility] ENDOSCOPY;  Service: Endoscopy;  Laterality: N/A;  . PEG TUBE PLACEMENT      Social History Social History   Tobacco Use  . Smoking status: Former Smoker    Quit date: 1989    Years since quitting: 32.2  . Smokeless tobacco: Never Used  Substance Use Topics  . Alcohol use: No  . Drug use: No    Family History Family History  Problem Relation Age of Onset  . Heart failure Mother   . Clotting disorder Mother   . Heart failure Father   . Hypertension Father   . Diabetes Father   . Stroke Brother   . Lung cancer Sister   No family history of bleeding/clotting disorders, porphyria or autoimmune disease   Allergies  Allergen Reactions  . Effexor Xr [Venlafaxine Hcl Er]   . Lisinopril   . Other Other (See Comments)    Jitters/nervous  . Oxycodone-Acetaminophen   . Budesonide-Formoterol Fumarate     Other reaction(s): Other (See Comments) hoarseness  . Fluticasone     Other reaction(s): Other (See Comments) hoarseness  . Propoxyphene Other (See Comments)    Jittery/nervous Jitters/nervous      REVIEW OF SYSTEMS (Negative unless checked)  Constitutional: [] Weight loss  [] Fever  [] Chills Cardiac: [] Chest pain   [] Chest pressure   [] Palpitations   [] Shortness of breath when  laying flat   [] Shortness of breath with exertion. Vascular:  [] Pain in legs with walking   [] Pain in legs at rest  [] History of DVT   [] Phlebitis   [] Swelling in legs   [] Varicose veins   [] Non-healing ulcers Pulmonary:   [] Uses home oxygen   [] Productive cough   [] Hemoptysis   [] Wheeze  [] COPD   [] Asthma Neurologic:  [] Dizziness   [] Seizures   [] History of stroke   [] History of TIA  [] Aphasia   [] Vissual changes   [] Weakness or numbness in arm   [] Weakness or numbness in leg Musculoskeletal:   [] Joint swelling   [] Joint pain   [] Low back pain Hematologic:  [] Easy  bruising  [] Easy bleeding   [] Hypercoagulable state   [] Anemic Gastrointestinal:  [] Diarrhea   [] Vomiting  [x] Gastroesophageal reflux/heartburn   [] Difficulty swallowing. Genitourinary:  [] Chronic kidney disease   [] Difficult urination  [] Frequent urination   [] Blood in urine Skin:  [] Rashes   [] Ulcers  Psychological:  [] History of anxiety   []  History of major depression.  Physical Examination  Vitals:   05/30/19 1555  BP: 102/70  Pulse: 82  Resp: 15  Height: 6\' 3"  (1.905 m)   Body mass index is 27.5 kg/m. Gen: WD/WN, NAD Head: /AT, No temporalis wasting.  Ear/Nose/Throat: Hearing grossly intact, nares w/o erythema or drainage, poor dentition Eyes: PER, EOMI, sclera nonicteric.  Neck: Supple, no masses.  No bruit or JVD.  Pulmonary:  Good air movement, clear to auscultation bilaterally, no use of accessory muscles.  Cardiac: RRR, normal S1, S2, no Murmurs. Vascular:  Vessel Right Left  Radial Palpable Palpable  Gastrointestinal: soft, non-distended. No guarding/no peritoneal signs.  Musculoskeletal: M/S 5/5 throughout.  No deformity or atrophy.  Neurologic: CN 2-12 intact. Pain and light touch intact in extremities.  Symmetrical.  Speech is fluent. Motor exam as listed above. Psychiatric: Judgment intact, Mood & affect appropriate for pt's clinical situation. Dermatologic: No rashes or ulcers noted.  No changes  consistent with cellulitis.  CBC Lab Results  Component Value Date   WBC 9.0 05/27/2019   HGB 13.0 05/27/2019   HCT 39.3 05/27/2019   MCV 95.6 05/27/2019   PLT 285 05/27/2019    BMET    Component Value Date/Time   NA 135 05/27/2019 1133   NA 143 09/20/2011 0433   K 3.7 05/27/2019 1133   K 4.4 09/20/2011 0433   CL 105 05/27/2019 1133   CL 108 (H) 09/20/2011 0433   CO2 24 05/27/2019 1133   CO2 25 09/20/2011 0433   GLUCOSE 139 (H) 05/27/2019 1133   GLUCOSE 106 (H) 09/20/2011 0433   BUN 14 05/27/2019 1133   BUN 13 09/20/2011 0433   CREATININE 0.88 05/27/2019 1133   CREATININE 0.87 09/20/2011 0433   CALCIUM 8.8 (L) 05/27/2019 1133   CALCIUM 8.6 09/20/2011 0433   GFRNONAA >60 05/27/2019 1133   GFRNONAA >60 09/20/2011 0433   GFRAA >60 05/27/2019 1133   GFRAA >60 09/20/2011 0433   Estimated Creatinine Clearance: 80 mL/min (by C-G formula based on SCr of 0.88 mg/dL).  COAG Lab Results  Component Value Date   INR 2.1 09/19/2011   INR 1.9 08/04/2011   INR 1.8 08/03/2011    Radiology CT ABDOMEN PELVIS W CONTRAST  Result Date: 05/15/2019 CLINICAL DATA:  79 year old male with left-sided abdominal pain. EXAM: CT ABDOMEN AND PELVIS WITH CONTRAST TECHNIQUE: Multidetector CT imaging of the abdomen and pelvis was performed using the standard protocol following bolus administration of intravenous contrast. CONTRAST:  11/21/2011 OMNIPAQUE IOHEXOL 300 MG/ML  SOLN COMPARISON:  CT abdomen pelvis dated 01/27/2011. FINDINGS: Lower chest: The visualized lung bases are clear. Multi vessel coronary vascular calcification and partially visualized pacemaker wire. Mild biatrial dilatation. No intra-abdominal free air or free fluid. Hepatobiliary: Mild irregularity of the liver contour may represent early changes of cirrhosis. Clinical correlation is recommended. No intrahepatic biliary ductal dilatation. The gallbladder is unremarkable. Pancreas: Mildly atrophic pancreas. No active inflammatory changes.  No dilatation of the main pancreatic duct. Spleen: Normal in size without focal abnormality. Adrenals/Urinary Tract: The adrenal glands are unremarkable. There is no hydronephrosis on either side. There is symmetric enhancement and excretion of contrast by both kidneys. There  is a 2 cm left anterior interpolar exophytic cyst. The visualized ureters are unremarkable. Multiple small stones noted layering along the posterior bladder wall. Stomach/Bowel: There is sigmoid diverticulosis without active inflammatory changes. There is a small hiatal hernia. Mildly thickened appearance of the gastroesophageal junction may be related to underdistention. Clinical correlation is recommended to evaluate for gastritis or esophagitis. There is no bowel obstruction. Appendectomy. Vascular/Lymphatic: Moderate aortoiliac atherosclerotic disease. The IVC is unremarkable. No portal venous gas. There is mild haziness of the mesentery with multiple mildly enlarged lymph nodes with a "misty mesentery" appearance. This finding is nonspecific but may be related to underlying inflammatory/infectious etiology. A neoplastic process is not excluded. Correlation with history of known malignancy and follow-up recommended. Reproductive: The prostate and seminal vesicles are grossly unremarkable. Other: None Musculoskeletal: Osteopenia with degenerative changes of the spine. No acute osseous pathology. IMPRESSION: 1. No acute intra-abdominal or pelvic pathology. 2. Sigmoid diverticulosis. No bowel obstruction. 3. Mildly thickened appearance of the gastroesophageal junction may be related to underdistention. Clinical correlation is recommended to evaluate for gastritis or esophagitis. 4. Mild haziness of the mesentery with multiple mildly enlarged lymph nodes. This finding is nonspecific but may be related to underlying inflammatory/infectious etiology. A neoplastic process is not excluded. Correlation with history of known malignancy and follow-up  recommended. 5. Aortic Atherosclerosis (ICD10-I70.0). Electronically Signed   By: Elgie Collard M.D.   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 Heart size is stable from prior study. Aortic calcifications are noted. There is some mild flattening at the left costophrenic angle which may represent atelectasis and/or a small left-sided effusion. There is no evidence for pneumothorax IMPRESSION: 1. No acute cardiopulmonary process. 2. Probable atelectasis at the left lung base. 3.  Aortic Atherosclerosis (ICD10-I70.0). Electronically Signed   By: Katherine Mantle M.D.   On: 05/15/2019 16:16   CT Angio Chest/Abd/Pel for Dissection W and/or Wo Contrast  Addendum Date: 05/19/2019   ADDENDUM REPORT: 05/19/2019 19:59 ADDENDUM: There is severe stenosis at the origin of the celiac axis. The remaining mesenteric vasculature is patent. Electronically Signed   By: Katherine Mantle M.D.   On: 05/19/2019 19:59   Result Date: 05/19/2019 CLINICAL DATA:  Abdominal pain and chest pain. EXAM: CT ANGIOGRAPHY CHEST, ABDOMEN AND PELVIS TECHNIQUE: Multidetector CT imaging through the chest, abdomen and pelvis was performed using the standard protocol during bolus administration of intravenous contrast. Multiplanar reconstructed images and MIPs were obtained and reviewed to evaluate the vascular anatomy. CONTRAST:  OMNIPAQUE IOHEXOL 350 MG/ML SOLN COMPARISON:  CT dated May 15, 2019 FINDINGS: CTA CHEST FINDINGS Cardiovascular: There is no evidence for thoracic aortic dissection or aneurysm. Mild atherosclerotic changes are noted of the thoracic aorta. The heart size is mildly enlarged. Coronary artery calcifications are noted. Right atrial enlargement is noted. There is a dual chamber left-sided pacemaker in place with grossly well-positioned  leads. Mediastinum/Nodes: --No mediastinal or hilar lymphadenopathy. --No axillary lymphadenopathy. --No supraclavicular lymphadenopathy. --Normal thyroid gland. --The esophagus is unremarkable Lungs/Pleura: There is a somewhat mosaic appearance of the lung parenchyma bilaterally. Evaluation of the lung fields is limited by extensive respiratory motion artifact. There is no large pleural effusion or pneumothorax. No definite focal infiltrate. There is some atelectasis at the lung bases. Musculoskeletal: No chest wall abnormality. No acute or significant osseous findings.  There is mild chronic appearing height loss of the T6 vertebral body. Review of the MIP images confirms the above findings. CTA ABDOMEN AND PELVIS FINDINGS VASCULAR Aorta: Normal caliber aorta without aneurysm, dissection, vasculitis or significant stenosis. 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 There appears to be moderate narrowing of the splenic and common hepatic arteries of unknown clinical significance. SMA: Patent without evidence of aneurysm, dissection, vasculitis or significant stenosis. Renals: Both renal arteries are patent without evidence of aneurysm, dissection, vasculitis, fibromuscular dysplasia or significant stenosis. IMA: Patent without evidence of aneurysm, dissection, vasculitis or significant stenosis. Inflow: Patent without evidence of aneurysm, dissection, vasculitis or significant stenosis. Veins: No obvious venous abnormality within the limitations of this arterial phase study. Review of the MIP images confirms the above findings. NON-VASCULAR Hepatobiliary: The liver is normal. Normal gallbladder.There is no biliary ductal dilation. Pancreas: Normal contours without ductal dilatation. No peripancreatic fluid collection. Spleen: No splenic laceration or hematoma. Adrenals/Urinary Tract: --Adrenal glands: No adrenal hemorrhage. --Right kidney/ureter: No hydronephrosis  or perinephric hematoma. --Left kidney/ureter: There is a left-sided exophytic cyst measuring approximately 2.7 cm. 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 --Urinary bladder: Again noted are multiple stones in the dependent portion of the urinary bladder. Stomach/Bowel: --Stomach/Duodenum: There is a small to moderate-sized hiatal hernia. --Small bowel: No dilatation or inflammation. --Colon: Rectosigmoid diverticulosis without acute inflammation. --Appendix: Surgically absent. Lymphatic: --multiple enlarged retroperitoneal lymph nodes are noted. For example there is a 1.2 cm lymph node anterior to the aorta (axial series 6, image 122). --again noted are multiple enlarged mesenteric lymph nodes measuring up to approximately 2.1 cm in the short axis. --No pelvic or inguinal lymphadenopathy. Reproductive: Unremarkable Other: The patient is status post prior bilateral inguinal hernia repair. The abdominal wall is normal. Musculoskeletal. No acute displaced fractures. A large Schmorl's node is noted involving the superior endplate of the L4 vertebral body. Review of the MIP images confirms the above findings. IMPRESSION: 1. No evidence of thoracic aortic dissection or aneurysm. 2. Persistent retroperitoneal and mesenteric adenopathy. This is of unknown clinical significance and may represent an infectious or inflammatory process. However, a view lymphoproliferative disease such as lymphoma is not excluded. Follow-up is recommended. 3. Mosaic appearance of the lung parenchyma may be secondary to small airways disease. 4. Cardiomegaly with right atrial enlargement. 5. Coronary artery calcifications. 6. Sigmoid diverticulosis without diverticulitis. 7. Multiple stones in the dependent portion of the urinary bladder. Aortic Atherosclerosis (ICD10-I70.0). Electronically Signed: By: Katherine Mantle M.D. On: 05/19/2019 19:55    Assessment/Plan 1. Celiac artery stenosis  (HCC) Recommend:  The patient has evidence of critical stricture of the celiac artery associated with postprandial abdominal pain. This represents a this represents a potential for catastrophic mesenteric ischemia and places the patient at the risk for loss of life.  Patient should undergo angiography of the celiac artery with the hope for intervention for alleviation of abdominal pain.  The risks and benefits as well as the alternative therapies was discussed in detail with the patient.  All questions were answered.  Patient agrees to proceed with angiography.  The patient will follow up with me in the office after the procedure.     2. Carotid stenosis, symptomatic, with infarction (HCC) Recommend:  Given the patient's asymptomatic subcritical stenosis no further invasive testing or surgery at this time.  Continue antiplatelet therapy as prescribed Continue management of CAD, HTN and Hyperlipidemia Healthy heart diet,  encouraged exercise  at least 4 times per week Follow up in 6 months with duplex ultrasound and physical exam   3. Atherosclerosis of native coronary artery of native heart without angina pectoris Continue cardiac and antihypertensive medications as already ordered and reviewed, no changes at this time.  Continue statin as ordered and reviewed, no changes at this time  Nitrates PRN for chest pain   4. Essential hypertension Continue antihypertensive medications as already ordered, these medications have been reviewed and there are no changes at this time.   5. Pulmonary emphysema, unspecified emphysema type (HCC) Continue pulmonary medications and aerosols as already ordered, these medications have been reviewed and there are no changes at this time.    6. Type 2 diabetes mellitus with diabetic peripheral angiopathy without gangrene, unspecified whether long term insulin use (HCC) Continue hypoglycemic medications as already ordered, these medications have been  reviewed and there are no changes at this time.  Hgb A1C to be monitored as already arranged by primary service   7. Mixed hyperlipidemia Continue statin as ordered and reviewed, no changes at this time     Levora Dredge, MD  06/04/2019 12:17 PM

## 2019-06-05 ENCOUNTER — Encounter (INDEPENDENT_AMBULATORY_CARE_PROVIDER_SITE_OTHER): Payer: Self-pay | Admitting: Vascular Surgery

## 2019-06-05 LAB — COMP PANEL: LEUKEMIA/LYMPHOMA

## 2019-06-05 LAB — KAPPA/LAMBDA LIGHT CHAINS
Kappa free light chain: 30.2 mg/L — ABNORMAL HIGH (ref 3.3–19.4)
Kappa, lambda light chain ratio: 1.16 (ref 0.26–1.65)
Lambda free light chains: 26.1 mg/L (ref 5.7–26.3)

## 2019-06-06 ENCOUNTER — Other Ambulatory Visit: Payer: Self-pay

## 2019-06-06 ENCOUNTER — Other Ambulatory Visit
Admission: RE | Admit: 2019-06-06 | Discharge: 2019-06-06 | Disposition: A | Discharge status: Home / Self Care | Payer: Medicare Other | Source: Ambulatory Visit | Attending: Gastroenterology | Admitting: Gastroenterology

## 2019-06-06 DIAGNOSIS — Z20822 Contact with and (suspected) exposure to covid-19: Secondary | ICD-10-CM | POA: Insufficient documentation

## 2019-06-06 DIAGNOSIS — Z01812 Encounter for preprocedural laboratory examination: Secondary | ICD-10-CM | POA: Insufficient documentation

## 2019-06-06 LAB — SARS CORONAVIRUS 2 (TAT 6-24 HRS): SARS Coronavirus 2: NEGATIVE

## 2019-06-07 ENCOUNTER — Telehealth: Payer: Self-pay

## 2019-06-07 ENCOUNTER — Encounter: Payer: Self-pay | Admitting: Gastroenterology

## 2019-06-07 DIAGNOSIS — R591 Generalized enlarged lymph nodes: Secondary | ICD-10-CM

## 2019-06-07 NOTE — Telephone Encounter (Signed)
-----   Message from Rickard Patience, MD sent at 06/06/2019 11:15 PM EDT ----- Flowcytometry was not done due to sample hemolyzed. Please arrange patient to repeat test.thanks.

## 2019-06-07 NOTE — Telephone Encounter (Signed)
Patient notified.  Has another MD appt in [place] next Tuesday @ 10:00.  They would like to have lab scheduled next Tuesday around 9:00.

## 2019-06-07 NOTE — Telephone Encounter (Signed)
Done..  Lab appt has been sched on 3/30 as requested. Pts wife is aware

## 2019-06-10 ENCOUNTER — Other Ambulatory Visit (INDEPENDENT_AMBULATORY_CARE_PROVIDER_SITE_OTHER): Payer: Self-pay | Admitting: Nurse Practitioner

## 2019-06-10 ENCOUNTER — Encounter
Admission: RE | Disposition: A | Discharge status: Home / Self Care | Payer: Self-pay | Source: Home / Self Care | Attending: Gastroenterology

## 2019-06-10 ENCOUNTER — Other Ambulatory Visit: Payer: Self-pay

## 2019-06-10 ENCOUNTER — Encounter: Payer: Self-pay | Admitting: Gastroenterology

## 2019-06-10 ENCOUNTER — Ambulatory Visit: Payer: Medicare Other | Admitting: Anesthesiology

## 2019-06-10 ENCOUNTER — Ambulatory Visit
Admission: RE | Admit: 2019-06-10 | Discharge: 2019-06-10 | Disposition: A | Discharge status: Home / Self Care | Payer: Medicare Other | Attending: Gastroenterology | Admitting: Gastroenterology

## 2019-06-10 DIAGNOSIS — Z79899 Other long term (current) drug therapy: Secondary | ICD-10-CM | POA: Diagnosis not present

## 2019-06-10 DIAGNOSIS — F329 Major depressive disorder, single episode, unspecified: Secondary | ICD-10-CM | POA: Diagnosis not present

## 2019-06-10 DIAGNOSIS — K219 Gastro-esophageal reflux disease without esophagitis: Secondary | ICD-10-CM | POA: Insufficient documentation

## 2019-06-10 DIAGNOSIS — Z950 Presence of cardiac pacemaker: Secondary | ICD-10-CM | POA: Insufficient documentation

## 2019-06-10 DIAGNOSIS — Z79890 Hormone replacement therapy (postmenopausal): Secondary | ICD-10-CM | POA: Diagnosis not present

## 2019-06-10 DIAGNOSIS — I2510 Atherosclerotic heart disease of native coronary artery without angina pectoris: Secondary | ICD-10-CM | POA: Insufficient documentation

## 2019-06-10 DIAGNOSIS — Z7901 Long term (current) use of anticoagulants: Secondary | ICD-10-CM | POA: Diagnosis not present

## 2019-06-10 DIAGNOSIS — G4730 Sleep apnea, unspecified: Secondary | ICD-10-CM | POA: Diagnosis not present

## 2019-06-10 DIAGNOSIS — R933 Abnormal findings on diagnostic imaging of other parts of digestive tract: Secondary | ICD-10-CM | POA: Diagnosis not present

## 2019-06-10 DIAGNOSIS — I10 Essential (primary) hypertension: Secondary | ICD-10-CM | POA: Diagnosis not present

## 2019-06-10 DIAGNOSIS — I6529 Occlusion and stenosis of unspecified carotid artery: Secondary | ICD-10-CM | POA: Insufficient documentation

## 2019-06-10 DIAGNOSIS — F419 Anxiety disorder, unspecified: Secondary | ICD-10-CM | POA: Diagnosis not present

## 2019-06-10 DIAGNOSIS — Z7982 Long term (current) use of aspirin: Secondary | ICD-10-CM | POA: Diagnosis not present

## 2019-06-10 DIAGNOSIS — Z9861 Coronary angioplasty status: Secondary | ICD-10-CM | POA: Diagnosis not present

## 2019-06-10 DIAGNOSIS — E785 Hyperlipidemia, unspecified: Secondary | ICD-10-CM | POA: Insufficient documentation

## 2019-06-10 DIAGNOSIS — E119 Type 2 diabetes mellitus without complications: Secondary | ICD-10-CM | POA: Diagnosis not present

## 2019-06-10 DIAGNOSIS — I69354 Hemiplegia and hemiparesis following cerebral infarction affecting left non-dominant side: Secondary | ICD-10-CM | POA: Insufficient documentation

## 2019-06-10 DIAGNOSIS — Z794 Long term (current) use of insulin: Secondary | ICD-10-CM | POA: Insufficient documentation

## 2019-06-10 DIAGNOSIS — Z87891 Personal history of nicotine dependence: Secondary | ICD-10-CM | POA: Diagnosis not present

## 2019-06-10 DIAGNOSIS — J449 Chronic obstructive pulmonary disease, unspecified: Secondary | ICD-10-CM | POA: Insufficient documentation

## 2019-06-10 DIAGNOSIS — I4891 Unspecified atrial fibrillation: Secondary | ICD-10-CM | POA: Insufficient documentation

## 2019-06-10 HISTORY — PX: ESOPHAGOGASTRODUODENOSCOPY (EGD) WITH PROPOFOL: SHX5813

## 2019-06-10 HISTORY — DX: Presence of cardiac pacemaker: Z95.0

## 2019-06-10 HISTORY — DX: Sleep apnea, unspecified: G47.30

## 2019-06-10 LAB — GLUCOSE, CAPILLARY: Glucose-Capillary: 100 mg/dL — ABNORMAL HIGH (ref 70–99)

## 2019-06-10 SURGERY — ESOPHAGOGASTRODUODENOSCOPY (EGD) WITH PROPOFOL
Anesthesia: General

## 2019-06-10 MED ORDER — LIDOCAINE HCL (CARDIAC) PF 100 MG/5ML IV SOSY
PREFILLED_SYRINGE | INTRAVENOUS | Status: DC | PRN
  Administered 2019-06-10: 100 mg via INTRAVENOUS

## 2019-06-10 MED ORDER — PROPOFOL 10 MG/ML IV BOLUS
INTRAVENOUS | Status: DC | PRN
  Administered 2019-06-10: 10 mg via INTRAVENOUS
  Administered 2019-06-10: 20 mg via INTRAVENOUS
  Administered 2019-06-10: 30 mg via INTRAVENOUS

## 2019-06-10 MED ORDER — SODIUM CHLORIDE 0.9 % IV SOLN: INTRAVENOUS | Status: DC

## 2019-06-10 MED ORDER — PROPOFOL 10 MG/ML IV BOLUS
INTRAVENOUS | Status: AC
  Filled 2019-06-10: qty 80

## 2019-06-10 NOTE — Anesthesia Postprocedure Evaluation (Signed)
Anesthesia Post Note  Patient: Oscar Brock  Procedure(s) Performed: ESOPHAGOGASTRODUODENOSCOPY (EGD) WITH PROPOFOL (N/A )  Patient location during evaluation: Endoscopy Anesthesia Type: General Level of consciousness: awake and alert Pain management: pain level controlled Vital Signs Assessment: post-procedure vital signs reviewed and stable Respiratory status: spontaneous breathing, nonlabored ventilation and respiratory function stable Cardiovascular status: blood pressure returned to baseline and stable Postop Assessment: no signs of nausea or vomiting Anesthetic complications: no     Last Vitals:  Vitals:   06/10/19 0944 06/10/19 0954  BP: 128/69 108/62  Pulse:    Resp: 14   Temp: 36.7 C   SpO2: 100%     Last Pain:  Vitals:   06/10/19 0954  TempSrc:   PainSc: 0-No pain                 Aqil Goetting

## 2019-06-10 NOTE — Op Note (Signed)
[facility] Gastroenterology Patient Name: Oscar Brock Procedure Date: 06/10/2019 9:04 AM MRN: [id number] Account #: [id number] Date of Birth: 07/11/1940 Admit Type: Outpatient Age: 79 Room: [facility] ENDO ROOM 4 Gender: Male Note Status: Finalized Procedure:             Upper GI endoscopy Indications:           Abnormal CT of the GI tract Providers:             Jonathon Bellows MD, MD Medicines:             Monitored Anesthesia Care Complications:         No immediate complications. Procedure:             Pre-Anesthesia Assessment:                        - Prior to the procedure, a History and Physical was                         performed, and patient medications, allergies and                         sensitivities were reviewed. The patient's tolerance                         of previous anesthesia was reviewed.                        - The risks and benefits of the procedure and the                         sedation options and risks were discussed with the                         patient. All questions were answered and informed                         consent was obtained.                        - ASA Grade Assessment: III - A patient with severe                         systemic disease.                        After obtaining informed consent, the endoscope was                         passed under direct vision. Throughout the procedure,                         the patient's blood pressure, pulse, and oxygen                         saturations were monitored continuously. The Endoscope                         was introduced through the mouth, and advanced to the  third part of duodenum. The upper GI endoscopy was                         accomplished with ease. The patient tolerated the                         procedure well. Findings:      The examined duodenum was normal.      The esophagus was normal.      A large amount of food (residue)  was found in the gastric body.      The exam was otherwise without abnormality.      The cardia and gastric fundus were normal on retroflexion. Impression:            - Normal examined duodenum.                        - Normal esophagus.                        - A large amount of food (residue) in the stomach.                        - The examination was otherwise normal.                        - No specimens collected. Recommendation:        - Discharge patient to home (with escort).                        - Resume previous diet.                        - Continue present medications. Procedure Code(s):     --- Professional ---                        [phone], Esophagogastroduodenoscopy, flexible,                         transoral; diagnostic, including collection of                         specimen(s) by brushing or washing, when performed                         (separate procedure) Diagnosis Code(s):     --- Professional ---                        R93.3, Abnormal findings on diagnostic imaging of                         other parts of digestive tract CPT copyright 2019 American Medical Association. All rights reserved. The codes documented in this report are preliminary and upon coder review may  be revised to meet current compliance requirements. [name], MD [name] MD, MD 06/10/2019 9:40:22 AM This report has been signed electronically. Number of Addenda: 0 Note Initiated On: 06/10/2019 9:04 AM Estimated Blood Loss:  Estimated blood loss: none.      [facility]

## 2019-06-10 NOTE — H&P (Signed)
[name], MD [address], [address], [place], [place], [address] [address], [address], [place], [place], [address] Phone: [phone]  Fax: [phone]  Primary Care Physician:  Mickey Farber, MD   Pre-Procedure History & Physical: HPI:  Oscar Brock is a 79 y.o. male is here for an endoscopy    Past Medical History:  Diagnosis Date  . Anxiety   . Arthritis   . Atrial fibrillation (HCC)   . 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 COPD (chronic obstructive pulmonary disease) (HCC)   . Diabetes mellitus without complication (HCC)   . GERD (gastroesophageal reflux disease)   . HTN (hypertension) 04/11/2017  . Hyperlipidemia 02/18/2009  . Hypertension   . Kidney stones   . 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 Presence of permanent cardiac pacemaker   . Sleep apnea   . Stroke (HCC)   . Tremor 07/12/2016    Past Surgical History:  Procedure Laterality Date  . APPENDECTOMY    . ESOPHAGOGASTRODUODENOSCOPY    . HERNIA REPAIR    . PACEMAKER INSERTION    . PACEMAKER INSERTION    . PEG PLACEMENT N/A 09/26/2017   Procedure: PERCUTANEOUS ENDOSCOPIC GASTROSTOMY (PEG) PLACEMENT;  Surgeon: Midge Minium, MD;  Location: [facility] ENDOSCOPY;  Service: Endoscopy;  Laterality: N/A;  . PEG TUBE PLACEMENT      Prior to Admission medications   Medication Sig Start Date End Date Taking? Authorizing Provider  acetaminophen (TYLENOL) 325 MG tablet Take 650 mg by mouth every 6 (six) hours as needed for mild pain or fever.    Yes [provider]  albuterol (PROVENTIL) (2.5 MG/3ML) 0.083% nebulizer solution Take 2.5 mg by nebulization every 6 (six) hours as needed for wheezing or shortness of breath.    Yes [provider]  albuterol (VENTOLIN HFA) 108 (90 Base) MCG/ACT inhaler Inhale 2 puffs into the lungs every 4 (four) hours as needed for wheezing or shortness of breath.   Yes [provider]  ALPRAZolam (XANAX) 0.25 MG tablet Take 0.125 mg  by mouth daily as needed for anxiety.    Yes [provider]  apixaban (ELIQUIS) 5 MG TABS tablet Take 5 mg by mouth 2 (two) times daily.   Yes [provider]  Artificial Saliva (BIOTENE DRY MOUTH MOISTURIZING) SOLN Take 1 spray by mouth every 2 (two) hours as needed (dry mouth).    Yes [provider]  aspirin EC 81 MG tablet Take 81 mg by mouth daily.    Yes [provider]  atorvastatin (LIPITOR) 40 MG tablet Take 40 mg by mouth daily.    Yes [provider]  bisacodyl (DULCOLAX) 5 MG EC tablet Take 5 mg by mouth daily as needed for mild constipation.    Yes [provider]  diclofenac Sodium (VOLTAREN) 1 % GEL Apply 2 g topically daily as needed (pain).   Yes [provider]  DULoxetine (CYMBALTA) 30 MG capsule Take 30 mg by mouth daily.   Yes [provider]  famotidine (PEPCID) 40 MG tablet Take 1 tablet (40 mg total) by mouth 2 (two) times daily. 05/20/19  Yes [name], MD  finasteride (PROSCAR) 5 MG tablet Take 5 mg by mouth daily.    Yes [provider]  fluticasone furoate-vilanterol (BREO ELLIPTA) 100-25 MCG/INH AEPB Inhale 1 puff into the lungs daily.   Yes [provider]  folic acid (FOLVITE) 1 MG tablet Take 1 mg by mouth daily.  Yes [provider]  gabapentin (NEURONTIN) 300 MG capsule Take 300 mg by mouth daily.    Yes [provider]  HUMALOG KWIKPEN 100 UNIT/ML KiwkPen Inject 1-3 Units into the skin every evening.    Yes [provider]  Insulin Detemir (LEVEMIR FLEXTOUCH) 100 UNIT/ML Pen Inject 20 Units into the skin daily.    Yes [provider]  ipratropium (ATROVENT) 0.02 % nebulizer solution Take 0.5 mg by nebulization 4 (four) times daily as needed for wheezing or shortness of breath.    Yes [provider]  levothyroxine (SYNTHROID) 125 MCG tablet Take 125 mcg by mouth daily.    Yes [provider]  Methotrexate Sodium  (METHOTREXATE, PF,) 200 MG/8ML injection Inject 15 mg into the skin every Monday.    Yes [provider]  metoprolol succinate (TOPROL-XL) 25 MG 24 hr tablet Take 25 mg by mouth 2 (two) times daily.   Yes [provider]  mirtazapine (REMERON) 15 MG tablet Take 15 mg by mouth at bedtime.    Yes [provider]  senna-docusate (SENOKOT-S) 8.6-50 MG tablet Take 2 tablets by mouth daily.    Yes [provider]  sodium chloride HYPERTONIC 3 % nebulizer solution Take 4 mLs by nebulization 2 (two) times daily as needed for other or cough.    Yes [provider]  traMADol (ULTRAM) 50 MG tablet Take 50 mg by mouth 2 (two) times daily as needed for pain.   Yes [provider]  sucralfate (CARAFATE) 1 g tablet Take 1 tablet (1 g total) by mouth 4 (four) times daily for 10 days. 05/15/19 05/25/19  Phineas Semen, MD    Allergies as of 05/24/2019 - Review Complete 05/24/2019  Allergen Reaction Noted  . Effexor xr [venlafaxine hcl er]  05/22/2016  . Lisinopril  05/22/2016  . Other Other (See Comments) 07/13/2012  . Oxycodone-acetaminophen  02/13/2017  . Budesonide-formoterol fumarate  02/21/2014  . Fluticasone  02/21/2014  . Propoxyphene Other (See Comments) 07/13/2012    Family History  Problem Relation Age of Onset  . Heart failure Mother   . Clotting disorder Mother   . Heart failure Father   . Hypertension Father   . Diabetes Father   . Stroke Brother   . Lung cancer Sister     Social History   Socioeconomic History  . Marital status: Married    Spouse name: Not on file  . Number of children: Not on file  . Years of education: Not on file  . Highest education level: Not on file  Occupational History  . Not on file  Tobacco Use  . Smoking status: Former Smoker    Quit date: 1989    Years since quitting: 32.2  . Smokeless tobacco: Never Used  Substance and Sexual Activity  . Alcohol use: No  . Drug use: No  . Sexual activity:  Not on file  Other Topics Concern  . Not on file  Social History Narrative  . Not on file   Social Determinants of Health   Financial Resource Strain:   . Difficulty of Paying Living Expenses:   Food Insecurity:   . Worried About Programme researcher, broadcasting/film/video in the Last Year:   . Barista in the Last Year:   Transportation Needs:   . Freight forwarder (Medical):   Marland Kitchen Lack of Transportation (Non-Medical):   Physical Activity:   . Days of Exercise per Week:   . Minutes of Exercise  per Session:   Stress:   . Feeling of Stress :   Social Connections:   . Frequency of Communication with Friends and Family:   . Frequency of Social Gatherings with Friends and Family:   . Attends Religious Services:   . Active Member of Clubs or Organizations:   . Attends Archivist Meetings:   Marland Kitchen Marital Status:   Intimate Partner Violence:   . Fear of Current or Ex-Partner:   . Emotionally Abused:   Marland Kitchen Physically Abused:   . Sexually Abused:     Review of Systems: See HPI, otherwise negative ROS  Physical Exam: BP 108/68   Pulse 60   Temp (!) 96.9 F (36.1 C) (Temporal)   Resp 18   Ht 6\' 3"  (1.905 m)   Wt 99.8 kg   SpO2 98%   BMI 27.50 kg/m  General:   Alert,  pleasant and cooperative in NAD Head:  Normocephalic and atraumatic. Neck:  Supple; no masses or thyromegaly. Lungs:  Clear throughout to auscultation, normal respiratory effort.    Heart:  +S1, +S2, Regular rate and rhythm, No edema. Abdomen:  Soft, nontender and nondistended. Normal bowel sounds, without guarding, and without rebound.   Neurologic:  Alert and  oriented x4  Impression/Plan: Oscar Brock is here for an endoscopy  to be performed for  evaluation of abnormal ct scan     Risks, benefits, limitations, and alternatives regarding endoscopy have been reviewed with the patient.  Questions have been answered.  All parties agreeable.   Jonathon Bellows, MD  06/10/2019, 9:06 AM

## 2019-06-10 NOTE — Anesthesia Preprocedure Evaluation (Signed)
Anesthesia Evaluation  Patient identified by MRN, date of birth, ID band Patient awake    Reviewed: Allergy & Precautions, NPO status , Patient's Chart, lab work & pertinent test results  History of Anesthesia Complications Negative for: history of anesthetic complications  Airway Mallampati: II  TM Distance: >3 FB Neck ROM: Full    Dental  (+) Upper Dentures, Lower Dentures   Pulmonary sleep apnea and Continuous Positive Airway Pressure Ventilation , COPD,  COPD inhaler, former smoker,    breath sounds clear to auscultation- rhonchi (-) wheezing      Cardiovascular hypertension, + CAD (hx of balloon angioplasty, no stents)  (-) Cardiac Stents and (-) CABG + dysrhythmias Atrial Fibrillation  Rhythm:Regular Rate:Normal - Systolic murmurs and - Diastolic murmurs    Neuro/Psych neg Seizures PSYCHIATRIC DISORDERS Anxiety Depression CVA (L sided paralysis), Residual Symptoms    GI/Hepatic Neg liver ROS, GERD  ,  Endo/Other  diabetes, Insulin DependentHypothyroidism   Renal/GU Renal disease: hx of nephrolithiasis.     Musculoskeletal  (+) Arthritis ,   Abdominal (+) - obese,   Peds  Hematology negative hematology ROS (+)   Anesthesia Other Findings Past Medical History: No date: Anxiety No date: Arthritis No date: Atrial fibrillation (HCC) 02/13/2017: Carotid stenosis, symptomatic, with infarction (HCC) No date: COPD (chronic obstructive pulmonary disease) (HCC) No date: Diabetes mellitus without complication (HCC) No date: GERD (gastroesophageal reflux disease) 04/11/2017: HTN (hypertension) 02/18/2009: Hyperlipidemia No date: Hypertension No date: Kidney stones 04/25/2017: Left hemiplegia (HCC) No date: Stroke (HCC) 07/12/2016: Tremor   Reproductive/Obstetrics                             Anesthesia Physical Anesthesia Plan  ASA: III  Anesthesia Plan: General   Post-op Pain Management:     Induction: Intravenous  PONV Risk Score and Plan: 1 and Propofol infusion  Airway Management Planned: Natural Airway  Additional Equipment:   Intra-op Plan:   Post-operative Plan:   Informed Consent: I have reviewed the patients History and Physical, chart, labs and discussed the procedure including the risks, benefits and alternatives for the proposed anesthesia with the patient or authorized representative who has indicated his/her understanding and acceptance.     Dental advisory given  Plan Discussed with: CRNA and Anesthesiologist  Anesthesia Plan Comments:         Anesthesia Quick Evaluation

## 2019-06-10 NOTE — Transfer of Care (Signed)
Immediate Anesthesia Transfer of Care Note  Patient: Oscar Brock  Procedure(s) Performed: ESOPHAGOGASTRODUODENOSCOPY (EGD) WITH PROPOFOL (N/A )  Patient Location: PACU and Endoscopy Unit  Anesthesia Type:General  Level of Consciousness: sedated, drowsy and patient cooperative  Airway & Oxygen Therapy: Patient Spontanous Breathing and Patient connected to nasal cannula oxygen  Post-op Assessment: Report given to RN and Post -op Vital signs reviewed and stable  Post vital signs: Reviewed and stable  Last Vitals:  Vitals Value Taken Time  BP 128/69 06/10/19 0945  Temp 36.7 C 06/10/19 0944  Pulse 60 06/10/19 0945  Resp 13 06/10/19 0945  SpO2 98 % 06/10/19 0945  Vitals shown include unvalidated device data.  Last Pain:  Vitals:   06/10/19 0944  TempSrc: Temporal  PainSc: Asleep         Complications: No apparent anesthesia complications

## 2019-06-11 ENCOUNTER — Ambulatory Visit
Admission: RE | Admit: 2019-06-11 | Discharge: 2019-06-11 | Disposition: A | Discharge status: Home / Self Care | Payer: Medicare Other | Attending: Vascular Surgery | Admitting: Vascular Surgery

## 2019-06-11 ENCOUNTER — Encounter
Admission: RE | Disposition: A | Discharge status: Home / Self Care | Payer: Self-pay | Source: Home / Self Care | Attending: Vascular Surgery

## 2019-06-11 ENCOUNTER — Ambulatory Visit: Payer: Medicare Other | Admitting: Gastroenterology

## 2019-06-11 ENCOUNTER — Encounter: Payer: Self-pay | Admitting: Oncology

## 2019-06-11 ENCOUNTER — Inpatient Hospital Stay: Payer: Medicare Other | Admitting: Oncology

## 2019-06-11 ENCOUNTER — Other Ambulatory Visit: Payer: Self-pay

## 2019-06-11 ENCOUNTER — Inpatient Hospital Stay: Payer: Medicare Other

## 2019-06-11 DIAGNOSIS — E782 Mixed hyperlipidemia: Secondary | ICD-10-CM | POA: Insufficient documentation

## 2019-06-11 DIAGNOSIS — R590 Localized enlarged lymph nodes: Secondary | ICD-10-CM | POA: Diagnosis not present

## 2019-06-11 DIAGNOSIS — Z7901 Long term (current) use of anticoagulants: Secondary | ICD-10-CM | POA: Insufficient documentation

## 2019-06-11 DIAGNOSIS — J449 Chronic obstructive pulmonary disease, unspecified: Secondary | ICD-10-CM | POA: Diagnosis not present

## 2019-06-11 DIAGNOSIS — I10 Essential (primary) hypertension: Secondary | ICD-10-CM | POA: Diagnosis not present

## 2019-06-11 DIAGNOSIS — E785 Hyperlipidemia, unspecified: Secondary | ICD-10-CM | POA: Diagnosis not present

## 2019-06-11 DIAGNOSIS — K219 Gastro-esophageal reflux disease without esophagitis: Secondary | ICD-10-CM | POA: Diagnosis not present

## 2019-06-11 DIAGNOSIS — F419 Anxiety disorder, unspecified: Secondary | ICD-10-CM | POA: Diagnosis not present

## 2019-06-11 DIAGNOSIS — Z7189 Other specified counseling: Secondary | ICD-10-CM | POA: Insufficient documentation

## 2019-06-11 DIAGNOSIS — M069 Rheumatoid arthritis, unspecified: Secondary | ICD-10-CM | POA: Diagnosis not present

## 2019-06-11 DIAGNOSIS — R531 Weakness: Secondary | ICD-10-CM | POA: Diagnosis not present

## 2019-06-11 DIAGNOSIS — Z794 Long term (current) use of insulin: Secondary | ICD-10-CM | POA: Diagnosis not present

## 2019-06-11 DIAGNOSIS — Z87891 Personal history of nicotine dependence: Secondary | ICD-10-CM | POA: Diagnosis not present

## 2019-06-11 DIAGNOSIS — R1010 Upper abdominal pain, unspecified: Secondary | ICD-10-CM | POA: Diagnosis not present

## 2019-06-11 DIAGNOSIS — K551 Chronic vascular disorders of intestine: Secondary | ICD-10-CM

## 2019-06-11 DIAGNOSIS — I774 Celiac artery compression syndrome: Secondary | ICD-10-CM | POA: Insufficient documentation

## 2019-06-11 DIAGNOSIS — Z79890 Hormone replacement therapy (postmenopausal): Secondary | ICD-10-CM | POA: Diagnosis not present

## 2019-06-11 DIAGNOSIS — I2510 Atherosclerotic heart disease of native coronary artery without angina pectoris: Secondary | ICD-10-CM | POA: Diagnosis not present

## 2019-06-11 DIAGNOSIS — Z79899 Other long term (current) drug therapy: Secondary | ICD-10-CM | POA: Diagnosis not present

## 2019-06-11 DIAGNOSIS — R591 Generalized enlarged lymph nodes: Secondary | ICD-10-CM

## 2019-06-11 DIAGNOSIS — E1151 Type 2 diabetes mellitus with diabetic peripheral angiopathy without gangrene: Secondary | ICD-10-CM | POA: Insufficient documentation

## 2019-06-11 DIAGNOSIS — I4891 Unspecified atrial fibrillation: Secondary | ICD-10-CM | POA: Insufficient documentation

## 2019-06-11 DIAGNOSIS — K5900 Constipation, unspecified: Secondary | ICD-10-CM | POA: Diagnosis not present

## 2019-06-11 DIAGNOSIS — N281 Cyst of kidney, acquired: Secondary | ICD-10-CM | POA: Diagnosis not present

## 2019-06-11 DIAGNOSIS — I119 Hypertensive heart disease without heart failure: Secondary | ICD-10-CM | POA: Diagnosis not present

## 2019-06-11 DIAGNOSIS — I69354 Hemiplegia and hemiparesis following cerebral infarction affecting left non-dominant side: Secondary | ICD-10-CM | POA: Diagnosis not present

## 2019-06-11 DIAGNOSIS — K55059 Acute (reversible) ischemia of intestine, part and extent unspecified: Secondary | ICD-10-CM | POA: Diagnosis not present

## 2019-06-11 DIAGNOSIS — Z7951 Long term (current) use of inhaled steroids: Secondary | ICD-10-CM | POA: Diagnosis not present

## 2019-06-11 DIAGNOSIS — R5383 Other fatigue: Secondary | ICD-10-CM | POA: Diagnosis not present

## 2019-06-11 DIAGNOSIS — Z8673 Personal history of transient ischemic attack (TIA), and cerebral infarction without residual deficits: Secondary | ICD-10-CM | POA: Diagnosis not present

## 2019-06-11 DIAGNOSIS — R0789 Other chest pain: Secondary | ICD-10-CM | POA: Diagnosis not present

## 2019-06-11 DIAGNOSIS — E119 Type 2 diabetes mellitus without complications: Secondary | ICD-10-CM | POA: Diagnosis not present

## 2019-06-11 HISTORY — PX: VISCERAL ANGIOGRAPHY: CATH118276

## 2019-06-11 SURGERY — VISCERAL ANGIOGRAPHY
Anesthesia: Moderate Sedation

## 2019-06-11 MED ORDER — HEPARIN SODIUM (PORCINE) 1000 UNIT/ML IJ SOLN
INTRAMUSCULAR | Status: DC | PRN
  Administered 2019-06-11: 5000 [IU] via INTRAVENOUS

## 2019-06-11 MED ORDER — ACETAMINOPHEN 325 MG PO TABS
ORAL_TABLET | ORAL | Status: AC
  Filled 2019-06-11: qty 2

## 2019-06-11 MED ORDER — LABETALOL HCL 5 MG/ML IV SOLN: 10.0000 mg | INTRAVENOUS | Status: DC | PRN

## 2019-06-11 MED ORDER — ACETAMINOPHEN 325 MG PO TABS
650.0000 mg | ORAL_TABLET | Freq: Four times a day (QID) | ORAL | Status: DC | PRN
  Administered 2019-06-11: 650 mg via ORAL

## 2019-06-11 MED ORDER — ONDANSETRON HCL 4 MG/2ML IJ SOLN: 4.0000 mg | Freq: Four times a day (QID) | INTRAMUSCULAR | Status: DC | PRN

## 2019-06-11 MED ORDER — FENTANYL CITRATE (PF) 100 MCG/2ML IJ SOLN
INTRAMUSCULAR | Status: AC
  Filled 2019-06-11: qty 2

## 2019-06-11 MED ORDER — HEPARIN SODIUM (PORCINE) 1000 UNIT/ML IJ SOLN
INTRAMUSCULAR | Status: AC
  Filled 2019-06-11: qty 1

## 2019-06-11 MED ORDER — SODIUM CHLORIDE 0.9 % IV SOLN: INTRAVENOUS | Status: DC

## 2019-06-11 MED ORDER — METHYLPREDNISOLONE SODIUM SUCC 125 MG IJ SOLR: 125.0000 mg | Freq: Once | INTRAMUSCULAR | Status: DC | PRN

## 2019-06-11 MED ORDER — FAMOTIDINE 20 MG PO TABS: 40.0000 mg | ORAL_TABLET | Freq: Once | ORAL | Status: DC | PRN

## 2019-06-11 MED ORDER — FENTANYL CITRATE (PF) 100 MCG/2ML IJ SOLN
INTRAMUSCULAR | Status: DC | PRN
  Administered 2019-06-11: 25 ug via INTRAVENOUS

## 2019-06-11 MED ORDER — IODIXANOL 320 MG/ML IV SOLN
INTRAVENOUS | Status: DC | PRN
  Administered 2019-06-11: 105 mL via INTRA_ARTERIAL

## 2019-06-11 MED ORDER — MIDAZOLAM HCL 2 MG/ML PO SYRP: 8.0000 mg | ORAL_SOLUTION | Freq: Once | ORAL | Status: DC | PRN

## 2019-06-11 MED ORDER — DIPHENHYDRAMINE HCL 50 MG/ML IJ SOLN: 50.0000 mg | Freq: Once | INTRAMUSCULAR | Status: DC | PRN

## 2019-06-11 MED ORDER — HYDRALAZINE HCL 20 MG/ML IJ SOLN: 5.0000 mg | INTRAMUSCULAR | Status: DC | PRN

## 2019-06-11 MED ORDER — CEFAZOLIN SODIUM-DEXTROSE 2-4 GM/100ML-% IV SOLN
INTRAVENOUS | Status: AC
  Filled 2019-06-11: qty 100

## 2019-06-11 MED ORDER — MIDAZOLAM HCL 5 MG/5ML IJ SOLN
INTRAMUSCULAR | Status: AC
  Filled 2019-06-11: qty 5

## 2019-06-11 MED ORDER — SODIUM CHLORIDE 0.9% FLUSH: 3.0000 mL | Freq: Two times a day (BID) | INTRAVENOUS | Status: DC

## 2019-06-11 MED ORDER — SODIUM CHLORIDE 0.9% FLUSH: 3.0000 mL | INTRAVENOUS | Status: DC | PRN

## 2019-06-11 MED ORDER — CEFAZOLIN SODIUM-DEXTROSE 2-4 GM/100ML-% IV SOLN
2.0000 g | Freq: Once | INTRAVENOUS | Status: AC
  Administered 2019-06-11: 11:00:00 2 g via INTRAVENOUS

## 2019-06-11 MED ORDER — HYDROMORPHONE HCL 1 MG/ML IJ SOLN: 1.0000 mg | Freq: Once | INTRAMUSCULAR | Status: DC | PRN

## 2019-06-11 MED ORDER — MIDAZOLAM HCL 2 MG/2ML IJ SOLN
INTRAMUSCULAR | Status: DC | PRN
  Administered 2019-06-11: 1 mg via INTRAVENOUS

## 2019-06-11 MED ORDER — MORPHINE SULFATE (PF) 4 MG/ML IV SOLN: 2.0000 mg | INTRAVENOUS | Status: DC | PRN

## 2019-06-11 MED ORDER — SODIUM CHLORIDE 0.9 % IV SOLN: 250.0000 mL | INTRAVENOUS | Status: DC | PRN

## 2019-06-11 SURGICAL SUPPLY — 19 items
CATH BEACON 5 .035 100 SIM1 TP (CATHETERS) ×3 IMPLANT
CATH BEACON 5 .035 65 C2 TIP (CATHETERS) ×3 IMPLANT
CATH LIMA 6F (CATHETERS) ×3 IMPLANT
CATH PIG 70CM (CATHETERS) ×3 IMPLANT
CATH VS15FR (CATHETERS) ×3 IMPLANT
DEVICE STARCLOSE SE CLOSURE (Vascular Products) ×3 IMPLANT
DEVICE TORQUE .025-.038 (MISCELLANEOUS) ×3 IMPLANT
GLIDEWIRE ANGLED SS 035X260CM (WIRE) ×3 IMPLANT
GUIDEWIRE ADV .018X180CM (WIRE) ×3 IMPLANT
GUIDEWIRE ANGLED .035 180CM (WIRE) ×3 IMPLANT
NEEDLE ENTRY 21GA 7CM ECHOTIP (NEEDLE) ×3 IMPLANT
PACK ANGIOGRAPHY (CUSTOM PROCEDURE TRAY) ×3 IMPLANT
SET INTRO CAPELLA COAXIAL (SET/KITS/TRAYS/PACK) ×3 IMPLANT
SHEATH BRITE TIP 5FRX11 (SHEATH) ×3 IMPLANT
SHEATH FLEXOR ANSEL2 7FRX45 (SHEATH) ×3 IMPLANT
SYR MEDRAD MARK 7 150ML (SYRINGE) ×3 IMPLANT
TUBING CONTRAST HIGH PRESS 72 (TUBING) ×3 IMPLANT
VALVE CHECKFLO PERFORMER (SHEATH) ×3 IMPLANT
WIRE J 3MM .035X145CM (WIRE) ×3 IMPLANT

## 2019-06-11 NOTE — Op Note (Signed)
[facility]  Percutaneous Study/Intervention Procedural Note   Date of Surgery:06/11/2019 Surgeon:River Mckercher  Pre-operative Diagnosis: Chronic mesenteric ischemia; postprandial abdominal pain  Post-operative diagnosis:  Same  Procedure(s) Performed:  1.  Abdominal aortogram AP and lateral views  2.  Selective injection of the celiac first order catheter placement  3.   Star close closure right common femoral artery    Anesthesia: Conscious sedation was administered under my direct supervision by the interventional radiology RN. IV Versed plus fentanyl were utilized. Continuous ECG, pulse oximetry and blood pressure was monitored throughout the entire procedure.  Conscious sedation was for a total of 50 minutes.  Sheath: 5 French  Contrast: 105 cc  Fluoroscopy Time: 15.8 minutes  Indications:  [name] presented with postprandial abdominal pain. 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 The patient's primary service is concerned that this represents mesenteric ischemia. The rest and benefits of angiography with possible intervention have been reviewed, all questions were answered and the patient has agreed to proceed with angiography and possible intervention.  Procedure:  ART LEVAN is a 79 y.o. y.o. male who was identified and appropriate procedural time out was performed.  The patient was then placed supine on the table and prepped and draped in the usual sterile fashion.    Ultrasound was used to evaluate the right common femoral artery.  It was pulsatile and echolucent indicating it was patent .  A micropuncture needle was used to access the right common femoral artery under direct ultrasound guidance and an image was recorded for the permanent record.  A 0.035 J wire was advanced without resistance and a 5Fr sheath was placed.    Pigtail catheter was positioned to the level of T10 and AP view of the aorta was obtained. This localized  the SMA and celiac artery origins. The pigtail catheter was repositioned the detector was then maneuvered into a lateral position.  Bolus injection contrast was utilized to demonstrate the origins of the celiac and the SMA. Working in the lateral projection pigtail catheter was exchanged for VS1 catheter over a Glidewire and the celiac artery was engaged with the catheter, hand-injection of contrast was performed this represented first order catheter placement.   Diagnostic interpretation: The abdominal aorta is opacified with a bolus injection of contrast in both the AP as well as the oblique.  Imaging of the origins of the celiac and the SMA arteries are identified.  There are no significant lesions within the aorta.  The SMA is widely patent as well.  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  There is also a sigmoid course to the artery and then a moderate focal enlargement consistent with a poststenotic dilatation.  Beyond this the hepatic and splenic arteries appear normal.  Attempts at crossing this lesion with a variety of different wires and catheters were unsuccessful given the extreme tortuosity.  And therefore the procedure was terminated today and arrangements will be made for upper extremity access so that we can gain a more advantageous approach.  The catheter is removed over wire oblique view of the right groin is obtained and a minx device deployed without difficulty there are no immediate complications.     Disposition: Patient was taken to the recovery room in stable condition having tolerated the procedure well.  Earl Lites Arianni Gallego 06/11/2019,7:45 PM

## 2019-06-11 NOTE — Progress Notes (Signed)
Rescheduling due to pending lab results.

## 2019-06-11 NOTE — Discharge Instructions (Signed)
° ° °Angiogram, Care After °This sheet gives you information about how to care for yourself after your procedure. Your doctor may also give you more specific instructions. If you have problems or questions, contact your doctor. °Follow these instructions at home: °Insertion site care °· Follow instructions from your doctor about how to take care of your long, thin tube (catheter) insertion area. Make sure you: °? Wash your hands with soap and water before you change your bandage (dressing). If you cannot use soap and water, use hand sanitizer. °? Change your bandage as told by your doctor. °? Leave stitches (sutures), skin glue, or skin tape (adhesive) strips in place. They may need to stay in place for 2 weeks or longer. If tape strips get loose and curl up, you may trim the loose edges. Do not remove tape strips completely unless your doctor says it is okay. °· Do not take baths, swim, or use a hot tub until your doctor says it is okay. °· You may shower 24-48 hours after the procedure or as told by your doctor. °? Gently wash the area with plain soap and water. °? Pat the area dry with a clean towel. °? Do not rub the area. This may cause bleeding. °· Do not apply powder or lotion to the area. Keep the area clean and dry. °· Check your insertion area every day for signs of infection. Check for: °? More redness, swelling, or pain. °? Fluid or blood. °? Warmth. °? Pus or a bad smell. °Activity °· Rest as told by your doctor, usually for 1-2 days. °· Do not lift anything that is heavier than 10 lbs. (4.5 kg) or as told by your doctor. °· Do not drive for 24 hours if you were given a medicine to help you relax (sedative). °· Do not drive or use heavy machinery while taking prescription pain medicine. °General instructions ° °· Go back to your normal activities as told by your doctor, usually in about a week. Ask your doctor what activities are safe for you. °· If the insertion area starts to bleed, lie flat and put  pressure on the area. If the bleeding does not stop, get help right away. This is an emergency. °· Drink enough fluid to keep your pee (urine) clear or pale yellow. °· Take over-the-counter and prescription medicines only as told by your doctor. °· Keep all follow-up visits as told by your doctor. This is important. °Contact a doctor if: °· You have a fever. °· You have chills. °· You have more redness, swelling, or pain around your insertion area. °· You have fluid or blood coming from your insertion area. °· The insertion area feels warm to the touch. °· You have pus or a bad smell coming from your insertion area. °· You have more bruising around the insertion area. °· Blood collects in the tissue around the insertion area (hematoma) that may be painful to the touch. °Get help right away if: °· You have a lot of pain in the insertion area. °· The insertion area swells very fast. °· The insertion area is bleeding, and the bleeding does not stop after holding steady pressure on the area. °· The area near or just beyond the insertion area becomes pale, cool, tingly, or numb. °These symptoms may be an emergency. Do not wait to see if the symptoms will go away. Get medical help right away. Call your local emergency services (911 in the [place] Do not drive yourself to   the hospital. °Summary °· After the procedure, it is common to have bruising and tenderness at the long, thin tube insertion area. °· After the procedure, it is important to rest and drink plenty of fluids. °· Do not take baths, swim, or use a hot tub until your doctor says it is okay to do so. You may shower 24-48 hours after the procedure or as told by your doctor. °· If the insertion area starts to bleed, lie flat and put pressure on the area. If the bleeding does not stop, get help right away. This is an emergency. °This information is not intended to replace advice given to you by your health care provider. Make sure you discuss any questions you have  with your health care provider. °Document Revised: 02/10/2017 Document Reviewed: 02/23/2016 °Elsevier Patient Education © 2020 [employer] °Moderate Conscious Sedation, Adult, Care After °These instructions provide you with information about caring for yourself after your procedure. Your health care provider may also give you more specific instructions. Your treatment has been planned according to current medical practices, but problems sometimes occur. Call your health care provider if you have any problems or questions after your procedure. °What can I expect after the procedure? °After your procedure, it is common: °· To feel sleepy for several hours. °· To feel clumsy and have poor balance for several hours. °· To have poor judgment for several hours. °· To vomit if you eat too soon. °Follow these instructions at home: °For at least 24 hours after the procedure: ° °· Do not: °? Participate in activities where you could fall or become injured. °? Drive. °? Use heavy machinery. °? Drink alcohol. °? Take sleeping pills or medicines that cause drowsiness. °? Make important decisions or sign legal documents. °? Take care of children on your own. °· Rest. °Eating and drinking °· Follow the diet recommended by your health care provider. °· If you vomit: °? Drink water, juice, or soup when you can drink without vomiting. °? Make sure you have little or no nausea before eating solid foods. °General instructions °· Have a responsible adult stay with you until you are awake and alert. °· Take over-the-counter and prescription medicines only as told by your health care provider. °· If you smoke, do not smoke without supervision. °· Keep all follow-up visits as told by your health care provider. This is important. °Contact a health care provider if: °· You keep feeling nauseous or you keep vomiting. °· You feel light-headed. °· You develop a rash. °· You have a fever. °Get help right away if: °· You have trouble  breathing. °This information is not intended to replace advice given to you by your health care provider. Make sure you discuss any questions you have with your health care provider. °Document Revised: 02/10/2017 Document Reviewed: 06/20/2015 °Elsevier Patient Education © 2020 [employer] ° ° °Femoral Site Care °This sheet gives you information about how to care for yourself after your procedure. Your health care provider may also give you more specific instructions. If you have problems or questions, contact your health care provider. °What can I expect after the procedure? °After the procedure, it is common to have: °· Bruising that usually fades within 1-2 weeks. °· Tenderness at the site. °Follow these instructions at home: °Wound care °· Follow instructions from your health care provider about how to take care of your insertion site. Make sure you: °? Wash your hands with soap and water before you change your   bandage (dressing). If soap and water are not available, use hand sanitizer. °? Change your dressing as told by your health care provider. °? Leave stitches (sutures), skin glue, or adhesive strips in place. These skin closures may need to stay in place for 2 weeks or longer. If adhesive strip edges start to loosen and curl up, you may trim the loose edges. Do not remove adhesive strips completely unless your health care provider tells you to do that. °· Do not take baths, swim, or use a hot tub until your health care provider approves. °· You may shower 24-48 hours after the procedure or as told by your health care provider. °? Gently wash the site with plain soap and water. °? Pat the area dry with a clean towel. °? Do not rub the site. This may cause bleeding. °· Do not apply powder or lotion to the site. Keep the site clean and dry. °· Check your femoral site every day for signs of infection. Check for: °? Redness, swelling, or pain. °? Fluid or blood. °? Warmth. °? Pus or a bad  smell. °Activity °· For the first 2-3 days after your procedure, or as long as directed: °? Avoid climbing stairs as much as possible. °? Do not squat. °· Do not lift anything that is heavier than 10 lb (4.5 kg), or the limit that you are told, until your health care provider says that it is safe. °· Rest as directed. °? Avoid sitting for a long time without moving. Get up to take short walks every 1-2 hours. °· Do not drive for 24 hours if you were given a medicine to help you relax (sedative). °General instructions °· Take over-the-counter and prescription medicines only as told by your health care provider. °· Keep all follow-up visits as told by your health care provider. This is important. °Contact a health care provider if you have: °· A fever or chills. °· You have redness, swelling, or pain around your insertion site. °Get help right away if: °· The catheter insertion area swells very fast. °· You pass out. °· You suddenly start to sweat or your skin gets clammy. °· The catheter insertion area is bleeding, and the bleeding does not stop when you hold steady pressure on the area. °· The area near or just beyond the catheter insertion site becomes pale, cool, tingly, or numb. °These symptoms may represent a serious problem that is an emergency. Do not wait to see if the symptoms will go away. Get medical help right away. Call your local emergency services (911 in the [place] Do not drive yourself to the hospital. °Summary °· After the procedure, it is common to have bruising that usually fades within 1-2 weeks. °· Check your femoral site every day for signs of infection. °· Do not lift anything that is heavier than 10 lb (4.5 kg), or the limit that you are told, until your health care provider says that it is safe. °This information is not intended to replace advice given to you by your health care provider. Make sure you discuss any questions you have with your health care provider. °Document Revised:  03/13/2017 Document Reviewed: 03/13/2017 °Elsevier Patient Education © 2020 Elsevier Inc. ° °

## 2019-06-11 NOTE — H&P (Signed)
[facility] History & Physical Update  The patient was interviewed and re-examined.  The patient's previous History and Physical has been reviewed and is unchanged.  There is no change in the plan of care. We plan to proceed with the scheduled procedure.  Levora Dredge, MD  06/11/2019, 10:06 AM

## 2019-06-12 ENCOUNTER — Encounter: Payer: Self-pay | Admitting: Cardiology

## 2019-06-14 ENCOUNTER — Encounter: Payer: Self-pay | Admitting: Oncology

## 2019-06-14 ENCOUNTER — Other Ambulatory Visit: Payer: Self-pay

## 2019-06-14 ENCOUNTER — Inpatient Hospital Stay: Payer: Medicare Other | Attending: Oncology | Admitting: Oncology

## 2019-06-14 DIAGNOSIS — F419 Anxiety disorder, unspecified: Secondary | ICD-10-CM | POA: Diagnosis not present

## 2019-06-14 DIAGNOSIS — E119 Type 2 diabetes mellitus without complications: Secondary | ICD-10-CM | POA: Diagnosis not present

## 2019-06-14 DIAGNOSIS — I10 Essential (primary) hypertension: Secondary | ICD-10-CM | POA: Diagnosis not present

## 2019-06-14 DIAGNOSIS — K219 Gastro-esophageal reflux disease without esophagitis: Secondary | ICD-10-CM | POA: Diagnosis not present

## 2019-06-14 DIAGNOSIS — I774 Celiac artery compression syndrome: Secondary | ICD-10-CM | POA: Diagnosis not present

## 2019-06-14 DIAGNOSIS — R109 Unspecified abdominal pain: Secondary | ICD-10-CM | POA: Insufficient documentation

## 2019-06-14 DIAGNOSIS — Z79899 Other long term (current) drug therapy: Secondary | ICD-10-CM | POA: Insufficient documentation

## 2019-06-14 DIAGNOSIS — I4891 Unspecified atrial fibrillation: Secondary | ICD-10-CM | POA: Diagnosis not present

## 2019-06-14 DIAGNOSIS — G4730 Sleep apnea, unspecified: Secondary | ICD-10-CM | POA: Diagnosis not present

## 2019-06-14 DIAGNOSIS — Z87891 Personal history of nicotine dependence: Secondary | ICD-10-CM | POA: Insufficient documentation

## 2019-06-14 DIAGNOSIS — Z791 Long term (current) use of non-steroidal anti-inflammatories (NSAID): Secondary | ICD-10-CM | POA: Insufficient documentation

## 2019-06-14 DIAGNOSIS — M1990 Unspecified osteoarthritis, unspecified site: Secondary | ICD-10-CM | POA: Insufficient documentation

## 2019-06-14 DIAGNOSIS — R1010 Upper abdominal pain, unspecified: Secondary | ICD-10-CM

## 2019-06-14 DIAGNOSIS — Z8673 Personal history of transient ischemic attack (TIA), and cerebral infarction without residual deficits: Secondary | ICD-10-CM | POA: Diagnosis not present

## 2019-06-14 DIAGNOSIS — J449 Chronic obstructive pulmonary disease, unspecified: Secondary | ICD-10-CM | POA: Diagnosis not present

## 2019-06-14 DIAGNOSIS — R591 Generalized enlarged lymph nodes: Secondary | ICD-10-CM | POA: Diagnosis present

## 2019-06-14 DIAGNOSIS — Z794 Long term (current) use of insulin: Secondary | ICD-10-CM | POA: Insufficient documentation

## 2019-06-14 DIAGNOSIS — R531 Weakness: Secondary | ICD-10-CM | POA: Diagnosis not present

## 2019-06-14 DIAGNOSIS — Z7901 Long term (current) use of anticoagulants: Secondary | ICD-10-CM | POA: Insufficient documentation

## 2019-06-14 DIAGNOSIS — I771 Stricture of artery: Secondary | ICD-10-CM

## 2019-06-14 DIAGNOSIS — Z950 Presence of cardiac pacemaker: Secondary | ICD-10-CM | POA: Insufficient documentation

## 2019-06-14 DIAGNOSIS — E785 Hyperlipidemia, unspecified: Secondary | ICD-10-CM | POA: Insufficient documentation

## 2019-06-14 DIAGNOSIS — Z7982 Long term (current) use of aspirin: Secondary | ICD-10-CM | POA: Diagnosis not present

## 2019-06-14 DIAGNOSIS — R5383 Other fatigue: Secondary | ICD-10-CM | POA: Diagnosis not present

## 2019-06-14 DIAGNOSIS — D72820 Lymphocytosis (symptomatic): Secondary | ICD-10-CM | POA: Diagnosis not present

## 2019-06-14 LAB — COMP PANEL: LEUKEMIA/LYMPHOMA

## 2019-06-14 NOTE — Progress Notes (Signed)
HEMATOLOGY-ONCOLOGY TeleHEALTH VISIT PROGRESS NOTE  I connected with Oscar Brock on 06/14/19 at  2:30 PM EDT by video enabled telemedicine visit and verified that I am speaking with the correct person using two identifiers. I discussed the limitations, risks, security and privacy concerns of performing an evaluation and management service by telemedicine and the availability of in-person appointments. I also discussed with the patient that there may be a patient responsible charge related to this service. The patient expressed understanding and agreed to proceed.   Other persons participating in the visit and their role in the encounter:  None  Patient's location: Home  Provider's location: office Chief Complaint: lymphadenopathy   INTERVAL HISTORY Oscar Brock is a 79 y.o. male who has above history reviewed by me today presents for follow up visit for management of lymphadenopathy Problems and complaints are listed below:  06/11/2019 EGD was normal.  He has established care with vascular surgery.  No new complaints.   Review of Systems  Constitutional: Positive for fatigue.  Respiratory: Negative for cough and shortness of breath.   Gastrointestinal: Positive for abdominal pain.  Genitourinary: Negative for dysuria.   Skin: Negative for rash.  Neurological:       Chronic left side weakness   Psychiatric/Behavioral: Negative for confusion.    Past Medical History:  Diagnosis Date  . Anxiety   . Arthritis   . Atrial fibrillation (HCC)   . 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 COPD (chronic obstructive pulmonary disease) (HCC)   . Diabetes mellitus without complication (HCC)   . GERD (gastroesophageal reflux disease)   . HTN (hypertension) 04/11/2017  . Hyperlipidemia 02/18/2009  . Hypertension   . Kidney stones   . 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 Presence of permanent cardiac pacemaker   . Sleep apnea   . Stroke (HCC)   . Tremor 07/12/2016    Past Surgical History:  Procedure Laterality Date  . APPENDECTOMY    . ESOPHAGOGASTRODUODENOSCOPY    . ESOPHAGOGASTRODUODENOSCOPY (EGD) WITH PROPOFOL N/A 06/10/2019   Procedure: ESOPHAGOGASTRODUODENOSCOPY (EGD) WITH PROPOFOL;  Surgeon: [name], MD;  Location: [facility] ENDOSCOPY;  Service: Gastroenterology;  Laterality: N/A;  . HERNIA REPAIR    . PACEMAKER INSERTION    . PACEMAKER INSERTION    . PEG PLACEMENT N/A 09/26/2017   Procedure: PERCUTANEOUS ENDOSCOPIC GASTROSTOMY (PEG) PLACEMENT;  Surgeon: Midge Minium, MD;  Location: [facility] ENDOSCOPY;  Service: Endoscopy;  Laterality: N/A;  . PEG TUBE PLACEMENT    . VISCERAL ANGIOGRAPHY N/A 06/11/2019   Procedure: VISCERAL ANGIOGRAPHY;  Surgeon: Renford Dills, MD;  Location: [facility] INVASIVE CV LAB;  Service: Cardiovascular;  Laterality: N/A;    Family History  Problem Relation Age of Onset  . Heart failure Mother   . Clotting disorder Mother   . Heart failure Father   . Hypertension Father   . Diabetes Father   . Stroke Brother   . Lung cancer Sister     Social History   Socioeconomic History  . Marital status: Married    Spouse name: Not on file  . Number of children: Not on file  . Years of education: Not on file  . Highest education level: Not on file  Occupational History  . Not on file  Tobacco Use  . Smoking status: Former Smoker    Quit date: 1989    Years since quitting: 32.2  . Smokeless tobacco: Never Used  Substance and Sexual Activity  . Alcohol use: No  .  Drug use: No  . Sexual activity: Not on file  Other Topics Concern  . Not on file  Social History Narrative  . Not on file   Social Determinants of Health   Financial Resource Strain:   . Difficulty of Paying Living Expenses:   Food Insecurity:   . Worried About Programme researcher, broadcasting/film/video in the Last Year:   . Barista in the Last Year:   Transportation Needs:   . Freight forwarder (Medical):   Marland Kitchen Lack of Transportation (Non-Medical):   Physical  Activity:   . Days of Exercise per Week:   . Minutes of Exercise per Session:   Stress:   . Feeling of Stress :   Social Connections:   . Frequency of Communication with Friends and Family:   . Frequency of Social Gatherings with Friends and Family:   . Attends Religious Services:   . Active Member of Clubs or Organizations:   . Attends Banker Meetings:   Marland Kitchen Marital Status:   Intimate Partner Violence:   . Fear of Current or Ex-Partner:   . Emotionally Abused:   Marland Kitchen Physically Abused:   . Sexually Abused:     Current Outpatient Medications on File Prior to Visit  Medication Sig Dispense Refill  . acetaminophen (TYLENOL) 325 MG tablet Take 650 mg by mouth every 6 (six) hours as needed for mild pain or fever.     Marland Kitchen albuterol (PROVENTIL) (2.5 MG/3ML) 0.083% nebulizer solution Take 2.5 mg by nebulization every 6 (six) hours as needed for wheezing or shortness of breath.     Marland Kitchen albuterol (VENTOLIN HFA) 108 (90 Base) MCG/ACT inhaler Inhale 2 puffs into the lungs every 4 (four) hours as needed for wheezing or shortness of breath.    . ALPRAZolam (XANAX) 0.25 MG tablet Take 0.125 mg by mouth daily as needed for anxiety.     Marland Kitchen apixaban (ELIQUIS) 5 MG TABS tablet Take 5 mg by mouth 2 (two) times daily.    . Artificial Saliva (BIOTENE DRY MOUTH MOISTURIZING) SOLN Take 1 spray by mouth every 2 (two) hours as needed (dry mouth).     Marland Kitchen aspirin EC 81 MG tablet Take 81 mg by mouth daily.     Marland Kitchen atorvastatin (LIPITOR) 40 MG tablet Take 40 mg by mouth daily.     . bisacodyl (DULCOLAX) 5 MG EC tablet Take 5 mg by mouth daily as needed for mild constipation.     . diclofenac Sodium (VOLTAREN) 1 % GEL Apply 2 g topically daily as needed (pain).    . DULoxetine (CYMBALTA) 30 MG capsule Take 30 mg by mouth daily.    . famotidine (PEPCID) 40 MG tablet Take 1 tablet (40 mg total) by mouth 2 (two) times daily. 180 tablet 1  . finasteride (PROSCAR) 5 MG tablet Take 5 mg by mouth daily.     .  fluticasone furoate-vilanterol (BREO ELLIPTA) 100-25 MCG/INH AEPB Inhale 1 puff into the lungs daily.    . folic acid (FOLVITE) 1 MG tablet Take 1 mg by mouth daily.    Marland Kitchen gabapentin (NEURONTIN) 300 MG capsule Take 300 mg by mouth daily.     Marland Kitchen HUMALOG KWIKPEN 100 UNIT/ML KiwkPen Inject 1-3 Units into the skin every evening.     . Insulin Detemir (LEVEMIR FLEXTOUCH) 100 UNIT/ML Pen Inject 20 Units into the skin daily.     Marland Kitchen ipratropium (ATROVENT) 0.02 % nebulizer solution Take 0.5 mg by nebulization 4 (four) times  daily as needed for wheezing or shortness of breath.     . levothyroxine (SYNTHROID) 125 MCG tablet Take 125 mcg by mouth daily.     . Methotrexate Sodium (METHOTREXATE, PF,) 200 MG/8ML injection Inject 15 mg into the skin every Monday.     . metoprolol succinate (TOPROL-XL) 25 MG 24 hr tablet Take 25 mg by mouth 2 (two) times daily.    . mirtazapine (REMERON) 15 MG tablet Take 15 mg by mouth at bedtime.     . senna-docusate (SENOKOT-S) 8.6-50 MG tablet Take 2 tablets by mouth daily.     . sodium chloride HYPERTONIC 3 % nebulizer solution Take 4 mLs by nebulization 2 (two) times daily as needed for other or cough.     . traMADol (ULTRAM) 50 MG tablet Take 50 mg by mouth 2 (two) times daily as needed for pain.    Marland Kitchen sucralfate (CARAFATE) 1 g tablet Take 1 tablet (1 g total) by mouth 4 (four) times daily for 10 days. (Patient not taking: Reported on 06/11/2019) 40 tablet 0   No current facility-administered medications on file prior to visit.    Allergies  Allergen Reactions  . Effexor Xr [Venlafaxine Hcl Er]   . Lisinopril   . Other Other (See Comments)    Jitters/nervous  . Oxycodone-Acetaminophen   . Budesonide-Formoterol Fumarate     Other reaction(s): Other (See Comments) hoarseness  . Fluticasone     Other reaction(s): Other (See Comments) hoarseness  . Propoxyphene Other (See Comments)    Jittery/nervous Jitters/nervous        Observations/Objective: Today's Vitals    06/14/19 1427  PainSc: 0-No pain   There is no height or weight on file to calculate BMI.  Physical Exam  Constitutional: No distress.    CBC    Component Value Date/Time   WBC 9.0 05/27/2019 1133   RBC 4.11 (L) 05/27/2019 1133   HGB 13.0 05/27/2019 1133   HGB 11.7 (L) 09/20/2011 0433   HCT 39.3 05/27/2019 1133   HCT 34.6 (L) 09/20/2011 0433   PLT 285 05/27/2019 1133   PLT 138 (L) 09/20/2011 0433   MCV 95.6 05/27/2019 1133   MCV 90 09/20/2011 0433   MCH 31.6 05/27/2019 1133   MCHC 33.1 05/27/2019 1133   RDW 15.0 05/27/2019 1133   RDW 15.2 (H) 09/20/2011 0433   LYMPHSABS 2.9 05/27/2019 1133   LYMPHSABS 1.8 09/20/2011 0433   MONOABS 0.9 05/27/2019 1133   MONOABS 0.6 09/20/2011 0433   EOSABS 0.5 05/27/2019 1133   EOSABS 0.1 09/20/2011 0433   BASOSABS 0.1 05/27/2019 1133   BASOSABS 0.1 09/20/2011 0433    CMP     Component Value Date/Time   NA 135 05/27/2019 1133   NA 143 09/20/2011 0433   K 3.7 05/27/2019 1133   K 4.4 09/20/2011 0433   CL 105 05/27/2019 1133   CL 108 (H) 09/20/2011 0433   CO2 24 05/27/2019 1133   CO2 25 09/20/2011 0433   GLUCOSE 139 (H) 05/27/2019 1133   GLUCOSE 106 (H) 09/20/2011 0433   BUN 14 05/27/2019 1133   BUN 13 09/20/2011 0433   CREATININE 0.88 05/27/2019 1133   CREATININE 0.87 09/20/2011 0433   CALCIUM 8.8 (L) 05/27/2019 1133   CALCIUM 8.6 09/20/2011 0433   PROT 7.1 05/27/2019 1133   PROT 7.4 09/19/2011 1405   ALBUMIN 3.8 05/27/2019 1133   ALBUMIN 3.9 09/19/2011 1405   AST 37 05/27/2019 1133   AST 29 09/19/2011 1405   ALT  43 05/27/2019 1133   ALT 41 09/19/2011 1405   ALKPHOS 60 05/27/2019 1133   ALKPHOS 69 09/19/2011 1405   BILITOT 0.7 05/27/2019 1133   BILITOT 0.9 09/19/2011 1405   GFRNONAA >60 05/27/2019 1133   GFRNONAA >60 09/20/2011 0433   GFRAA >60 05/27/2019 1133   GFRAA >60 09/20/2011 0433     Assessment and Plan: 1. Lymphadenopathy   2. 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 Monoclonal  B-cell lymphocytosis     Labs are reviewed and discussed with patient. Flowcytometry showed CD5+ CD23+ clonal B cell population, CLL/SLL phenotype,  <1% of leukocyte, <5000/ul. Diagnosis of monoclonal lymphocytosis of unknown significance was discussed.   Lymphadenopathy maybe due to chronic ischemia due to celiac artery stenosis,  Or due to SLL Follow up with vascular surgery for management.  Observation for now. Repeat CT after treatment of celiac compression  Follow Up Instructions: 2 months.    I discussed the assessment and treatment plan with the patient. The patient was provided an opportunity to ask questions and all were answered. The patient agreed with the plan and demonstrated an understanding of the instructions.  The patient was advised to call back or seek an in-person evaluation if the symptoms worsen or if the condition fails to improve as anticipated.    Earlie Server, MD 06/14/2019 11:59 PM

## 2019-06-14 NOTE — Progress Notes (Signed)
Patient verified using two identifiers for virtual visit via telephone today.  Patient still has abdominal pain after eating lunch.

## 2019-06-20 ENCOUNTER — Other Ambulatory Visit: Payer: Self-pay

## 2019-06-20 ENCOUNTER — Ambulatory Visit (INDEPENDENT_AMBULATORY_CARE_PROVIDER_SITE_OTHER): Payer: Medicare Other | Admitting: Vascular Surgery

## 2019-06-20 ENCOUNTER — Telehealth (INDEPENDENT_AMBULATORY_CARE_PROVIDER_SITE_OTHER): Payer: Self-pay

## 2019-06-20 ENCOUNTER — Encounter (INDEPENDENT_AMBULATORY_CARE_PROVIDER_SITE_OTHER): Payer: Self-pay | Admitting: Vascular Surgery

## 2019-06-20 VITALS — BP 117/70 | HR 66 | Resp 14

## 2019-06-20 DIAGNOSIS — E1143 Type 2 diabetes mellitus with diabetic autonomic (poly)neuropathy: Secondary | ICD-10-CM

## 2019-06-20 DIAGNOSIS — K3184 Gastroparesis: Secondary | ICD-10-CM

## 2019-06-20 DIAGNOSIS — I10 Essential (primary) hypertension: Secondary | ICD-10-CM | POA: Diagnosis not present

## 2019-06-20 DIAGNOSIS — I771 Stricture of artery: Secondary | ICD-10-CM

## 2019-06-20 DIAGNOSIS — I774 Celiac artery compression syndrome: Secondary | ICD-10-CM | POA: Diagnosis not present

## 2019-06-20 DIAGNOSIS — E1151 Type 2 diabetes mellitus with diabetic peripheral angiopathy without gangrene: Secondary | ICD-10-CM

## 2019-06-20 MED ORDER — METOCLOPRAMIDE HCL 10 MG PO TABS: 10.0000 mg | ORAL_TABLET | Freq: Three times a day (TID) | ORAL | 1 refills | Status: DC

## 2019-06-20 NOTE — Telephone Encounter (Signed)
Patient was seen in our office today, I called and spoke with the patient's wife because per the wife he was in a bad mood. Patient is now scheduled for a celia stent placement with Dr. Gilda Crease on 06/25/19 with a 11:00 am arrival time to the [facility]. Patient will do covid testing on 06/21/19 between 8-1 pm at the [facility]. Pre-procedure instructions were discussed with the patient's wife and she stated she understood. Patient is to take half of his insulin dose the night before and only blood pressure medications with a sip of water the day of his procedure. Patient's procedure will be with anesthesia.

## 2019-06-20 NOTE — Progress Notes (Signed)
MRN : [id number]  Oscar Brock is a 79 y.o. (Dec 31, 1940) male who presents with chief complaint of  Chief Complaint  Patient presents with  . Follow-up    [facility] 1week post visceral angio  .  History of Present Illness:   The patient returns to the office for follow-up status post unsuccessful intervention of the celiac artery.  He continues to have severe pain which is unrelenting.  It is primarily associated with eating.  The patient has not noted a large weight loss but does have nausea.  The patient does substantiate food fear, particular foods do not seem to aggravate or alleviate the symptoms.  The patient denies bloody bowel movements or diarrhea.  The patient has a history of colonoscopy which was not diagnostic.  No history of peptic ulcer disease but EGD did show retained food c/w gastroparesis.   The patient denies amaurosis fugax or recent TIA symptoms. There are no recent neurological changes noted. The patient denies claudication symptoms or rest pain symptoms. The patient denies history of DVT, PE or superficial thrombophlebitis. 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  SMA is patent.  Current Meds  Medication Sig  . acetaminophen (TYLENOL) 325 MG tablet Take 650 mg by mouth every 6 (six) hours as needed for mild pain or fever.   Marland Kitchen albuterol (PROVENTIL) (2.5 MG/3ML) 0.083% nebulizer solution Take 2.5 mg by nebulization every 6 (six) hours as needed for wheezing or shortness of breath.   Marland Kitchen albuterol (VENTOLIN HFA) 108 (90 Base) MCG/ACT inhaler Inhale 2 puffs into the lungs every 4 (four) hours as needed for wheezing or shortness of breath.  . ALPRAZolam (XANAX) 0.25 MG tablet Take 0.125 mg by mouth daily as needed for anxiety.   Marland Kitchen apixaban (ELIQUIS) 5 MG TABS tablet Take 5 mg by mouth 2 (two) times daily.  . Artificial Saliva  (BIOTENE DRY MOUTH MOISTURIZING) SOLN Take 1 spray by mouth every 2 (two) hours as needed (dry mouth).   Marland Kitchen aspirin EC 81 MG tablet Take 81 mg by mouth daily.   Marland Kitchen atorvastatin (LIPITOR) 40 MG tablet Take 40 mg by mouth daily.   . bisacodyl (DULCOLAX) 5 MG EC tablet Take 5 mg by mouth daily as needed for mild constipation.   . diclofenac Sodium (VOLTAREN) 1 % GEL Apply 2 g topically daily as needed (pain).  . DULoxetine (CYMBALTA) 30 MG capsule Take 30 mg by mouth daily.  . famotidine (PEPCID) 40 MG tablet Take 1 tablet (40 mg total) by mouth 2 (two) times daily.  . finasteride (PROSCAR) 5 MG tablet Take 5 mg by mouth daily.   . fluticasone furoate-vilanterol (BREO ELLIPTA) 100-25 MCG/INH AEPB Inhale 1 puff into the lungs daily.  . folic acid (FOLVITE) 1 MG tablet Take 1 mg by mouth daily.  Marland Kitchen gabapentin (NEURONTIN) 300 MG capsule Take 300 mg by mouth daily.   Marland Kitchen HUMALOG KWIKPEN 100 UNIT/ML KiwkPen Inject 1-3 Units into the skin every evening.   . Insulin Detemir (LEVEMIR FLEXTOUCH) 100 UNIT/ML Pen Inject 20 Units into the skin daily.   Marland Kitchen ipratropium (ATROVENT) 0.02 % nebulizer solution Take 0.5 mg by nebulization 4 (four) times daily as needed for wheezing or shortness of breath.   . levothyroxine (SYNTHROID) 125 MCG tablet Take 125 mcg by mouth daily.   . Methotrexate Sodium (  METHOTREXATE, PF,) 200 MG/8ML injection Inject 15 mg into the skin every Monday.   . metoprolol succinate (TOPROL-XL) 25 MG 24 hr tablet Take 25 mg by mouth 2 (two) times daily.  . mirtazapine (REMERON) 15 MG tablet Take 15 mg by mouth at bedtime.   . senna-docusate (SENOKOT-S) 8.6-50 MG tablet Take 2 tablets by mouth daily.   . sodium chloride HYPERTONIC 3 % nebulizer solution Take 4 mLs by nebulization 2 (two) times daily as needed for other or cough.   . traMADol (ULTRAM) 50 MG tablet Take 50 mg by mouth 2 (two) times daily as needed for pain.    Past Medical History:  Diagnosis Date  . Anxiety   . Arthritis   .  Atrial fibrillation (HCC)   . 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 COPD (chronic obstructive pulmonary disease) (HCC)   . Diabetes mellitus without complication (HCC)   . GERD (gastroesophageal reflux disease)   . HTN (hypertension) 04/11/2017  . Hyperlipidemia 02/18/2009  . Hypertension   . Kidney stones   . 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 Presence of permanent cardiac pacemaker   . Sleep apnea   . Stroke (HCC)   . Tremor 07/12/2016    Past Surgical History:  Procedure Laterality Date  . APPENDECTOMY    . ESOPHAGOGASTRODUODENOSCOPY    . ESOPHAGOGASTRODUODENOSCOPY (EGD) WITH PROPOFOL N/A 06/10/2019   Procedure: ESOPHAGOGASTRODUODENOSCOPY (EGD) WITH PROPOFOL;  Surgeon: [name], MD;  Location: [facility] ENDOSCOPY;  Service: Gastroenterology;  Laterality: N/A;  . HERNIA REPAIR    . PACEMAKER INSERTION    . PACEMAKER INSERTION    . PEG PLACEMENT N/A 09/26/2017   Procedure: PERCUTANEOUS ENDOSCOPIC GASTROSTOMY (PEG) PLACEMENT;  Surgeon: Midge Minium, MD;  Location: [facility] ENDOSCOPY;  Service: Endoscopy;  Laterality: N/A;  . PEG TUBE PLACEMENT    . VISCERAL ANGIOGRAPHY N/A 06/11/2019   Procedure: VISCERAL ANGIOGRAPHY;  Surgeon: Renford Dills, MD;  Location: [facility] INVASIVE CV LAB;  Service: Cardiovascular;  Laterality: N/A;    Social History Social History   Tobacco Use  . Smoking status: Former Smoker    Quit date: 1989    Years since quitting: 32.2  . Smokeless tobacco: Never Used  Substance Use Topics  . Alcohol use: No  . Drug use: No    Family History Family History  Problem Relation Age of Onset  . Heart failure Mother   . Clotting disorder Mother   . Heart failure Father   . Hypertension Father   . Diabetes Father   . Stroke Brother   . Lung cancer Sister     Allergies  Allergen Reactions  . Effexor Xr [Venlafaxine Hcl Er]   . Lisinopril   . Other Other (See Comments)    Jitters/nervous  . Oxycodone-Acetaminophen   .  Budesonide-Formoterol Fumarate     Other reaction(s): Other (See Comments) hoarseness  . Fluticasone     Other reaction(s): Other (See Comments) hoarseness  . Propoxyphene Other (See Comments)    Jittery/nervous Jitters/nervous      REVIEW OF SYSTEMS (Negative unless checked)  Constitutional: [] Weight loss  [] Fever  [] Chills Cardiac: [] Chest pain   [] Chest pressure   [] Palpitations   [] Shortness of breath when laying flat   [] Shortness of breath with exertion. Vascular:  [] Pain in legs with walking   [] Pain in legs at rest  [] History of DVT   [] Phlebitis   [] Swelling in legs   [] Varicose veins   [] Non-healing ulcers Pulmonary:   [] Uses home  oxygen   [] Productive cough   [] Hemoptysis   [] Wheeze  [] COPD   [] Asthma Neurologic:  [] Dizziness   [] Seizures   [] History of stroke   [] History of TIA  [] Aphasia   [] Vissual changes   [] Weakness or numbness in arm   [] Weakness or numbness in leg Musculoskeletal:   [] Joint swelling   [] Joint pain   [] Low back pain Hematologic:  [] Easy bruising  [] Easy bleeding   [] Hypercoagulable state   [] Anemic Gastrointestinal:  [] Diarrhea   [] Vomiting  [] Gastroesophageal reflux/heartburn   [] Difficulty swallowing. Genitourinary:  [] Chronic kidney disease   [] Difficult urination  [] Frequent urination   [] Blood in urine Skin:  [] Rashes   [] Ulcers  Psychological:  [] History of anxiety   []  History of major depression.  Physical Examination  Vitals:   06/20/19 1322  BP: 117/70  Pulse: 66  Resp: 14   There is no height or weight on file to calculate BMI. Gen: WD/WN, NAD; seen in a wheelchair Head: [place]/AT, No temporalis wasting.  Ear/Nose/Throat: Hearing grossly intact, nares w/o erythema or drainage Eyes: PER, EOMI, sclera nonicteric.  Neck: Supple, no large masses.   Pulmonary:  Good air movement, no audible wheezing bilaterally, no use of accessory muscles.  Cardiac: RRR, no JVD Gastrointestinal: Non-distended. No guarding/no peritoneal signs.   Musculoskeletal: M/S 5/5 throughout.  No deformity or atrophy.  Neurologic: CN 2-12 intact. Symmetrical.  Speech is fluent. Motor exam as listed above. Psychiatric: Judgment intact, Mood & affect appropriate for pt's clinical situation. Dermatologic: No rashes or ulcers noted.  No changes consistent with cellulitis.   CBC Lab Results  Component Value Date   WBC 9.0 05/27/2019   HGB 13.0 05/27/2019   HCT 39.3 05/27/2019   MCV 95.6 05/27/2019   PLT 285 05/27/2019    BMET    Component Value Date/Time   NA 135 05/27/2019 1133   NA 143 09/20/2011 0433   K 3.7 05/27/2019 1133   K 4.4 09/20/2011 0433   CL 105 05/27/2019 1133   CL 108 (H) 09/20/2011 0433   CO2 24 05/27/2019 1133   CO2 25 09/20/2011 0433   GLUCOSE 139 (H) 05/27/2019 1133   GLUCOSE 106 (H) 09/20/2011 0433   BUN 14 05/27/2019 1133   BUN 13 09/20/2011 0433   CREATININE 0.88 05/27/2019 1133   CREATININE 0.87 09/20/2011 0433   CALCIUM 8.8 (L) 05/27/2019 1133   CALCIUM 8.6 09/20/2011 0433   GFRNONAA >60 05/27/2019 1133   GFRNONAA >60 09/20/2011 0433   GFRAA >60 05/27/2019 1133   GFRAA >60 09/20/2011 0433   CrCl cannot be calculated (Patient's most recent lab result is older than the maximum 21 days allowed.).  COAG Lab Results  Component Value Date   INR 2.1 09/19/2011   INR 1.9 08/04/2011   INR 1.8 08/03/2011    Radiology PERIPHERAL VASCULAR CATHETERIZATION  Result Date: 06/11/2019 See op note    Assessment/Plan 1. 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 Celiac artery stenosis (HCC) Recommend:  The patient has evidence of critical stricture of the celiac artery associated with postprandial abdominal pain. This represents a this represents a potential for catastrophic mesenteric ischemia and places the patient at the risk for loss of life.  Patient should undergo angiography of the celiac artery with the hope for intervention for alleviation of abdominal pain.    I will approach  this from the right arm and will arrange the procedure to be under general anesthesia.  Hopefully this will optimize our  chances for success.  The increased risks associated with the right arm approach were reviewed with the patient and his wife.  The risks and benefits as well as the alternative therapies was discussed in detail with the patient.  All questions were answered.  Patient agrees to proceed with angiography.  The patient will follow up with me in the office after the procedure.   3. Essential hypertension Continue antihypertensive medications as already ordered, these medications have been reviewed and there are no changes at this time.   4. 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  Hgb A1C to be monitored as already arranged by primary service     Hortencia Pilar, MD  06/20/2019 2:49 PM

## 2019-06-21 ENCOUNTER — Other Ambulatory Visit
Admission: RE | Admit: 2019-06-21 | Discharge: 2019-06-21 | Disposition: A | Discharge status: Home / Self Care | Payer: Medicare Other | Source: Ambulatory Visit | Attending: Vascular Surgery | Admitting: Vascular Surgery

## 2019-06-21 DIAGNOSIS — Z01812 Encounter for preprocedural laboratory examination: Secondary | ICD-10-CM | POA: Insufficient documentation

## 2019-06-21 DIAGNOSIS — Z20822 Contact with and (suspected) exposure to covid-19: Secondary | ICD-10-CM | POA: Diagnosis not present

## 2019-06-21 LAB — SARS CORONAVIRUS 2 (TAT 6-24 HRS): SARS Coronavirus 2: NEGATIVE

## 2019-06-25 ENCOUNTER — Encounter: Payer: Self-pay | Admitting: Vascular Surgery

## 2019-06-25 ENCOUNTER — Ambulatory Visit: Payer: Medicare Other | Admitting: Anesthesiology

## 2019-06-25 ENCOUNTER — Encounter
Admission: RE | Disposition: A | Discharge status: Home / Self Care | Payer: Self-pay | Source: Home / Self Care | Attending: Vascular Surgery

## 2019-06-25 ENCOUNTER — Observation Stay
Admission: RE | Admit: 2019-06-25 | Discharge: 2019-06-26 | Disposition: A | Discharge status: Home / Self Care | Payer: Medicare Other | Attending: Vascular Surgery | Admitting: Vascular Surgery

## 2019-06-25 ENCOUNTER — Other Ambulatory Visit (INDEPENDENT_AMBULATORY_CARE_PROVIDER_SITE_OTHER): Payer: Self-pay | Admitting: Nurse Practitioner

## 2019-06-25 ENCOUNTER — Other Ambulatory Visit: Payer: Self-pay

## 2019-06-25 DIAGNOSIS — I6529 Occlusion and stenosis of unspecified carotid artery: Secondary | ICD-10-CM | POA: Diagnosis not present

## 2019-06-25 DIAGNOSIS — Z950 Presence of cardiac pacemaker: Secondary | ICD-10-CM | POA: Diagnosis not present

## 2019-06-25 DIAGNOSIS — E1143 Type 2 diabetes mellitus with diabetic autonomic (poly)neuropathy: Secondary | ICD-10-CM | POA: Insufficient documentation

## 2019-06-25 DIAGNOSIS — Z79899 Other long term (current) drug therapy: Secondary | ICD-10-CM | POA: Diagnosis not present

## 2019-06-25 DIAGNOSIS — I774 Celiac artery compression syndrome: Secondary | ICD-10-CM | POA: Diagnosis not present

## 2019-06-25 DIAGNOSIS — I4891 Unspecified atrial fibrillation: Secondary | ICD-10-CM | POA: Diagnosis not present

## 2019-06-25 DIAGNOSIS — G4730 Sleep apnea, unspecified: Secondary | ICD-10-CM | POA: Insufficient documentation

## 2019-06-25 DIAGNOSIS — J449 Chronic obstructive pulmonary disease, unspecified: Secondary | ICD-10-CM | POA: Diagnosis not present

## 2019-06-25 DIAGNOSIS — Z8673 Personal history of transient ischemic attack (TIA), and cerebral infarction without residual deficits: Secondary | ICD-10-CM | POA: Insufficient documentation

## 2019-06-25 DIAGNOSIS — Z79890 Hormone replacement therapy (postmenopausal): Secondary | ICD-10-CM | POA: Diagnosis not present

## 2019-06-25 DIAGNOSIS — K219 Gastro-esophageal reflux disease without esophagitis: Secondary | ICD-10-CM | POA: Insufficient documentation

## 2019-06-25 DIAGNOSIS — F419 Anxiety disorder, unspecified: Secondary | ICD-10-CM | POA: Diagnosis not present

## 2019-06-25 DIAGNOSIS — E785 Hyperlipidemia, unspecified: Secondary | ICD-10-CM | POA: Diagnosis not present

## 2019-06-25 DIAGNOSIS — K551 Chronic vascular disorders of intestine: Secondary | ICD-10-CM | POA: Diagnosis not present

## 2019-06-25 DIAGNOSIS — Z7982 Long term (current) use of aspirin: Secondary | ICD-10-CM | POA: Diagnosis not present

## 2019-06-25 DIAGNOSIS — Z794 Long term (current) use of insulin: Secondary | ICD-10-CM | POA: Insufficient documentation

## 2019-06-25 DIAGNOSIS — I10 Essential (primary) hypertension: Secondary | ICD-10-CM | POA: Diagnosis not present

## 2019-06-25 DIAGNOSIS — Z7901 Long term (current) use of anticoagulants: Secondary | ICD-10-CM | POA: Diagnosis not present

## 2019-06-25 DIAGNOSIS — Z87891 Personal history of nicotine dependence: Secondary | ICD-10-CM | POA: Insufficient documentation

## 2019-06-25 DIAGNOSIS — K3184 Gastroparesis: Secondary | ICD-10-CM | POA: Insufficient documentation

## 2019-06-25 DIAGNOSIS — E1151 Type 2 diabetes mellitus with diabetic peripheral angiopathy without gangrene: Secondary | ICD-10-CM | POA: Insufficient documentation

## 2019-06-25 DIAGNOSIS — K559 Vascular disorder of intestine, unspecified: Secondary | ICD-10-CM

## 2019-06-25 HISTORY — DX: Headache, unspecified: R51.9

## 2019-06-25 HISTORY — DX: Unspecified dementia, unspecified severity, without behavioral disturbance, psychotic disturbance, mood disturbance, and anxiety: F03.90

## 2019-06-25 HISTORY — DX: Unspecified asthma, uncomplicated: J45.909

## 2019-06-25 HISTORY — PX: VISCERAL ANGIOGRAPHY: CATH118276

## 2019-06-25 HISTORY — DX: Paralytic syndrome, unspecified: G83.9

## 2019-06-25 HISTORY — DX: Dyspnea, unspecified: R06.00

## 2019-06-25 LAB — BUN: BUN: 12 mg/dL (ref 8–23)

## 2019-06-25 LAB — GLUCOSE, CAPILLARY
Glucose-Capillary: 119 mg/dL — ABNORMAL HIGH (ref 70–99)
Glucose-Capillary: 127 mg/dL — ABNORMAL HIGH (ref 70–99)
Glucose-Capillary: 179 mg/dL — ABNORMAL HIGH (ref 70–99)

## 2019-06-25 LAB — CREATININE, SERUM
Creatinine, Ser: 0.83 mg/dL (ref 0.61–1.24)
GFR calc Af Amer: >60 mL/min (ref >60)
GFR calc non Af Amer: >60 mL/min (ref >60)

## 2019-06-25 SURGERY — VISCERAL ANGIOGRAPHY
Anesthesia: General

## 2019-06-25 MED ORDER — FENTANYL CITRATE (PF) 100 MCG/2ML IJ SOLN
INTRAMUSCULAR | Status: AC
  Filled 2019-06-25: qty 2

## 2019-06-25 MED ORDER — FENTANYL CITRATE (PF) 100 MCG/2ML IJ SOLN: 12.5000 ug | Freq: Once | INTRAMUSCULAR | Status: DC | PRN

## 2019-06-25 MED ORDER — DIPHENHYDRAMINE HCL 50 MG/ML IJ SOLN: 50.0000 mg | Freq: Once | INTRAMUSCULAR | Status: DC | PRN

## 2019-06-25 MED ORDER — INSULIN ASPART 100 UNIT/ML ~~LOC~~ SOLN
0.0000 [IU] | Freq: Three times a day (TID) | SUBCUTANEOUS | Status: DC
  Administered 2019-06-26: 2 [IU] via SUBCUTANEOUS
  Administered 2019-06-26: 3 [IU] via SUBCUTANEOUS
  Filled 2019-06-25 (×2): qty 1

## 2019-06-25 MED ORDER — LIDOCAINE HCL (CARDIAC) PF 100 MG/5ML IV SOSY
PREFILLED_SYRINGE | INTRAVENOUS | Status: DC | PRN
  Administered 2019-06-25: 60 mg via INTRAVENOUS

## 2019-06-25 MED ORDER — HEPARIN SODIUM (PORCINE) 5000 UNIT/ML IJ SOLN: 5000.0000 [IU] | Freq: Three times a day (TID) | INTRAMUSCULAR | Status: DC

## 2019-06-25 MED ORDER — PHENYLEPHRINE HCL-NACL 10-0.9 MG/250ML-% IV SOLN
INTRAVENOUS | Status: DC | PRN
  Administered 2019-06-25: 50 ug/min via INTRAVENOUS

## 2019-06-25 MED ORDER — ONDANSETRON HCL 4 MG/2ML IJ SOLN: 4.0000 mg | Freq: Four times a day (QID) | INTRAMUSCULAR | Status: DC | PRN

## 2019-06-25 MED ORDER — SODIUM CHLORIDE 0.9 % IV SOLN: INTRAVENOUS | Status: DC

## 2019-06-25 MED ORDER — TRAMADOL HCL 50 MG PO TABS
50.0000 mg | ORAL_TABLET | Freq: Four times a day (QID) | ORAL | Status: DC | PRN
  Administered 2019-06-25 – 2019-06-26 (×2): 50 mg via ORAL
  Filled 2019-06-25 (×2): qty 1

## 2019-06-25 MED ORDER — FAMOTIDINE 20 MG PO TABS: 40.0000 mg | ORAL_TABLET | Freq: Once | ORAL | Status: DC | PRN

## 2019-06-25 MED ORDER — ALBUTEROL SULFATE HFA 108 (90 BASE) MCG/ACT IN AERS: 2.0000 | INHALATION_SPRAY | RESPIRATORY_TRACT | Status: DC | PRN

## 2019-06-25 MED ORDER — LEVOTHYROXINE SODIUM 125 MCG PO TABS
125.0000 ug | ORAL_TABLET | Freq: Every day | ORAL | Status: DC
  Administered 2019-06-26: 125 ug via ORAL
  Filled 2019-06-25 (×2): qty 1

## 2019-06-25 MED ORDER — METOPROLOL SUCCINATE ER 25 MG PO TB24
25.0000 mg | ORAL_TABLET | Freq: Two times a day (BID) | ORAL | Status: DC
  Administered 2019-06-25 – 2019-06-26 (×2): 25 mg via ORAL
  Filled 2019-06-25 (×2): qty 1

## 2019-06-25 MED ORDER — ATORVASTATIN CALCIUM 20 MG PO TABS
40.0000 mg | ORAL_TABLET | Freq: Every day | ORAL | Status: DC
  Administered 2019-06-25: 40 mg via ORAL
  Filled 2019-06-25: qty 2

## 2019-06-25 MED ORDER — GLYCOPYRROLATE 0.2 MG/ML IJ SOLN
INTRAMUSCULAR | Status: DC | PRN
  Administered 2019-06-25: .2 mg via INTRAVENOUS

## 2019-06-25 MED ORDER — PNEUMOCOCCAL VAC POLYVALENT 25 MCG/0.5ML IJ INJ: 0.5000 mL | INJECTION | INTRAMUSCULAR | Status: DC

## 2019-06-25 MED ORDER — ASPIRIN EC 81 MG PO TBEC
81.0000 mg | DELAYED_RELEASE_TABLET | Freq: Every day | ORAL | Status: DC
  Administered 2019-06-26: 81 mg via ORAL
  Filled 2019-06-25: qty 1

## 2019-06-25 MED ORDER — CEFAZOLIN SODIUM-DEXTROSE 2-4 GM/100ML-% IV SOLN
2.0000 g | Freq: Once | INTRAVENOUS | Status: AC
  Administered 2019-06-25: 2 g via INTRAVENOUS

## 2019-06-25 MED ORDER — FINASTERIDE 5 MG PO TABS
5.0000 mg | ORAL_TABLET | Freq: Every day | ORAL | Status: DC
  Administered 2019-06-25: 5 mg via ORAL
  Filled 2019-06-25: qty 1

## 2019-06-25 MED ORDER — FENTANYL CITRATE (PF) 100 MCG/2ML IJ SOLN
INTRAMUSCULAR | Status: DC | PRN
  Administered 2019-06-25: 50 ug via INTRAVENOUS
  Administered 2019-06-25 (×3): 25 ug via INTRAVENOUS

## 2019-06-25 MED ORDER — FAMOTIDINE 20 MG PO TABS
40.0000 mg | ORAL_TABLET | Freq: Two times a day (BID) | ORAL | Status: DC
  Administered 2019-06-25 – 2019-06-26 (×2): 40 mg via ORAL
  Filled 2019-06-25 (×2): qty 2

## 2019-06-25 MED ORDER — ONDANSETRON HCL 4 MG/2ML IJ SOLN
INTRAMUSCULAR | Status: DC | PRN
  Administered 2019-06-25: 4 mg via INTRAVENOUS

## 2019-06-25 MED ORDER — IODIXANOL 320 MG/ML IV SOLN
INTRAVENOUS | Status: DC | PRN
  Administered 2019-06-25: 110 mL via INTRA_ARTERIAL

## 2019-06-25 MED ORDER — GABAPENTIN 300 MG PO CAPS
300.0000 mg | ORAL_CAPSULE | Freq: Every day | ORAL | Status: DC
  Administered 2019-06-25: 300 mg via ORAL
  Filled 2019-06-25: qty 1

## 2019-06-25 MED ORDER — APIXABAN 5 MG PO TABS
5.0000 mg | ORAL_TABLET | Freq: Two times a day (BID) | ORAL | Status: DC
  Administered 2019-06-25 – 2019-06-26 (×2): 5 mg via ORAL
  Filled 2019-06-25 (×2): qty 1

## 2019-06-25 MED ORDER — PROPOFOL 10 MG/ML IV BOLUS
INTRAVENOUS | Status: AC
  Filled 2019-06-25: qty 20

## 2019-06-25 MED ORDER — EPHEDRINE SULFATE 50 MG/ML IJ SOLN
INTRAMUSCULAR | Status: DC | PRN
  Administered 2019-06-25 (×2): 5 mg via INTRAVENOUS
  Administered 2019-06-25 (×2): 10 mg via INTRAVENOUS

## 2019-06-25 MED ORDER — PROPOFOL 10 MG/ML IV BOLUS
INTRAVENOUS | Status: DC | PRN
  Administered 2019-06-25: 80 mg via INTRAVENOUS

## 2019-06-25 MED ORDER — PHENYLEPHRINE HCL (PRESSORS) 10 MG/ML IV SOLN
INTRAVENOUS | Status: AC
  Filled 2019-06-25: qty 1

## 2019-06-25 MED ORDER — ACETAMINOPHEN 500 MG PO TABS
1000.0000 mg | ORAL_TABLET | Freq: Three times a day (TID) | ORAL | Status: DC | PRN
  Administered 2019-06-25 – 2019-06-26 (×2): 1000 mg via ORAL
  Filled 2019-06-25 (×2): qty 2

## 2019-06-25 MED ORDER — METHYLPREDNISOLONE SODIUM SUCC 125 MG IJ SOLR: 125.0000 mg | Freq: Once | INTRAMUSCULAR | Status: DC | PRN

## 2019-06-25 MED ORDER — MIDAZOLAM HCL 2 MG/ML PO SYRP: 8.0000 mg | ORAL_SOLUTION | Freq: Once | ORAL | Status: DC | PRN

## 2019-06-25 MED ORDER — DULOXETINE HCL 60 MG PO CPEP
60.0000 mg | ORAL_CAPSULE | Freq: Every day | ORAL | Status: DC
  Administered 2019-06-26: 60 mg via ORAL
  Filled 2019-06-25: qty 1
  Filled 2019-06-25: qty 2

## 2019-06-25 MED ORDER — HEPARIN SODIUM (PORCINE) 1000 UNIT/ML IJ SOLN
INTRAMUSCULAR | Status: DC | PRN
  Administered 2019-06-25: 1000 [IU] via INTRAVENOUS
  Administered 2019-06-25: 5000 [IU] via INTRAVENOUS

## 2019-06-25 MED ORDER — INSULIN ASPART 100 UNIT/ML ~~LOC~~ SOLN: 0.0000 [IU] | Freq: Every day | SUBCUTANEOUS | Status: DC

## 2019-06-25 MED ORDER — VASOPRESSIN 20 UNIT/ML IV SOLN
INTRAVENOUS | Status: DC | PRN
  Administered 2019-06-25 (×2): .5 [IU] via INTRAVENOUS

## 2019-06-25 MED ORDER — ALBUTEROL SULFATE (2.5 MG/3ML) 0.083% IN NEBU: 2.5000 mg | INHALATION_SOLUTION | Freq: Four times a day (QID) | RESPIRATORY_TRACT | Status: DC | PRN

## 2019-06-25 MED ORDER — CEFAZOLIN SODIUM-DEXTROSE 2-4 GM/100ML-% IV SOLN
INTRAVENOUS | Status: AC
  Filled 2019-06-25: qty 100

## 2019-06-25 SURGICAL SUPPLY — 34 items
BALLN LUTONIX DCB 5X40X130 (BALLOONS) ×3
BALLN MUSTANG 8X20X135 (BALLOONS) ×3
BALLN ULTRVRSE 7X40X130C (BALLOONS) ×3
BALLOON LUTONIX DCB 5X40X130 (BALLOONS) IMPLANT
BALLOON MUSTANG 8X20X135 (BALLOONS) IMPLANT
BALLOON ULTRVRSE 7X40X130C (BALLOONS) IMPLANT
CATH ANGIO 5F 100CM .035 PIG (CATHETERS) ×2 IMPLANT
CATH INFINITI 5F MPA2 125 (CATHETERS) ×2 IMPLANT
CATH VERT 5FR 125CM (CATHETERS) ×2 IMPLANT
DEVICE PRESTO INFLATION (MISCELLANEOUS) ×2 IMPLANT
DEVICE TORQUE .025-.038 (MISCELLANEOUS) ×2 IMPLANT
DRAPE INCISE IOBAN 66X45 STRL (DRAPES) ×2 IMPLANT
GLIDECATH ANGLED 4FR 120CM (CATHETERS) ×2 IMPLANT
GLIDEWIRE ADV .035X260CM (WIRE) ×2 IMPLANT
GLIDEWIRE ANGLED SS 035X260CM (WIRE) ×2 IMPLANT
GUIDEWIRE PFTE-COATED .018X300 (WIRE) ×2 IMPLANT
NDL ENTRY 21GA 7CM ECHOTIP (NEEDLE) IMPLANT
NEEDLE ENTRY 21GA 7CM ECHOTIP (NEEDLE) ×3 IMPLANT
PACK ANGIOGRAPHY (CUSTOM PROCEDURE TRAY) ×3 IMPLANT
SET INTRO CAPELLA COAXIAL (SET/KITS/TRAYS/PACK) ×2 IMPLANT
SHEATH BRITE TIP 5FRX11 (SHEATH) ×2 IMPLANT
SHEATH SHUTTLE 7FR (SHEATH) ×2 IMPLANT
SPONGE XRAY 4X4 16PLY STRL (MISCELLANEOUS) ×4 IMPLANT
STENT LIFESTREAM 7X26X135 (Permanent Stent) ×2 IMPLANT
STENT VIABAHN 8X2.5X120 (Permanent Stent) IMPLANT
STENT VIABAHN 8X25X120 (Permanent Stent) ×2 IMPLANT
SYR MEDRAD MARK 7 150ML (SYRINGE) ×2 IMPLANT
TUBE CONN 8.8X1320 FR HP M-F (CONNECTOR) ×4 IMPLANT
TUBING CONTRAST HIGH PRESS 72 (TUBING) ×3 IMPLANT
VALVE CHECKFLO PERFORMER (SHEATH) ×2 IMPLANT
WIRE AMPLATZ SSTIFF .035X260CM (WIRE) ×2 IMPLANT
WIRE G V18X300CM (WIRE) ×2 IMPLANT
WIRE J 3MM .035X145CM (WIRE) ×3 IMPLANT
WIRE MAGIC TORQUE 315CM (WIRE) ×2 IMPLANT

## 2019-06-25 NOTE — Anesthesia Preprocedure Evaluation (Signed)
Anesthesia Evaluation  Patient identified by MRN, date of birth, ID band Patient awake    Reviewed: Allergy & Precautions, H&P , NPO status , Patient's Chart, lab work & pertinent test results, reviewed documented beta blocker date and time   Airway Mallampati: II  TM Distance: >3 FB Neck ROM: full    Dental  (+) Teeth Intact   Pulmonary shortness of breath and with exertion, asthma , sleep apnea , COPD, former smoker,    Pulmonary exam normal        Cardiovascular Exercise Tolerance: Poor hypertension, On Medications + CAD  Normal cardiovascular exam+ pacemaker  Rate:Normal     Neuro/Psych  Headaches, PSYCHIATRIC DISORDERS Anxiety Depression Dementia  Neuromuscular disease CVA, Residual Symptoms    GI/Hepatic Neg liver ROS, GERD  Medicated,  Endo/Other  diabetes, Well Controlled, Type 1, Insulin DependentHypothyroidism   Renal/GU Renal disease  negative genitourinary   Musculoskeletal   Abdominal   Peds  Hematology negative hematology ROS (+)   Anesthesia Other Findings   Reproductive/Obstetrics negative OB ROS                             Anesthesia Physical Anesthesia Plan  ASA: IV  Anesthesia Plan: General LMA   Post-op Pain Management:    Induction:   PONV Risk Score and Plan: 3  Airway Management Planned:   Additional Equipment:   Intra-op Plan:   Post-operative Plan:   Informed Consent: I have reviewed the patients History and Physical, chart, labs and discussed the procedure including the risks, benefits and alternatives for the proposed anesthesia with the patient or authorized representative who has indicated his/her understanding and acceptance.       Plan Discussed with: CRNA  Anesthesia Plan Comments: (Consent per patient and wife.  JA)        Anesthesia Quick Evaluation

## 2019-06-25 NOTE — Op Note (Signed)
[facility] Percutaneous Study/Intervention Procedural Note   Date: 06/25/2019  Surgeon(s): Hortencia Pilar, MD  Assistants: none  Pre-operative Diagnosis: 1.  Chronic mesenteric ischemia 2.  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 Cutdown right brachial artery 2. Catheter placement into splenic artery from right brachial artery approach 3. Aortogram and selective angiogram of the splenic artery  4.  Percutaneous transluminal angioplasty of the celiac trunk with a 5 mm diameter x 40 mm length angioplasty balloon 5. Stent to the celiac trunk with 7 mm diameter x 26 mm length lifestream balloon expandable stent with additional 8 mm x 25 mm Viabahn stent placement   Contrast: 110  Fluoro time: 31.4  EBL: 20  Indications: Patient is a 79 y.o. male who has symptoms consistent with mesenteric ischemia. 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 The patient is brought in for angiography for further evaluation and potential treatment. Risks and benefits are discussed and informed consent is obtained  Procedure: The patient was identified and appropriate procedural time out was performed. The patient was then placed supine on the table with his right arm extended palm upward and prepped and draped in the usual sterile fashion.   15 blade scalpel was used to make a linear incision at the level of the antecubital fossa overlying the brachial impulse.  Quarter percent Marcaine is then infiltrated into the soft tissues as well as the skin edge.  Dissection is carried down to expose the fascia which was then opened sharply with Metzenbaum scissors exposing the brachial artery.  Brachial artery is then looped proximally and distally for control.  A micropuncture needle was then inserted  into the anterior wall the brachial artery without difficulty and microwire was advanced.  Micro sheath is then inserted followed by a J-wire.  A 0.035 J wire was advanced without resistance and a 5Fr sheath was placed.  Pigtail catheter was placed into the aorta and a lateral image demonstrated the subtotal occlusion of the celiac as well as the patent SMA. The patient was given 5000 units of IV heparin with an additional 1000 later in the case.  We upsized to a 7 Fr sheath 90 cm shuttle sheath.  A multipurpose catheter was used followed by a vertebral catheter to selectively cannulate the celiac artery.  Successful cannulation was achieved with the vertebral catheter he and and a 018 advantage wire. With the sheath now advanced up to the origin of the celiac hand-injection of contrast demonstrated a greater 95% stenosis of the celiac artery.   I crossed the lesion without difficulty with the vertebral catheter and advanced the catheter out into the distal splenic artery where hand-injection confirmed location and intraluminal positioning. I used a 5 mm diameter x 40 mm length Lutonix drug-eluting angioplasty balloon and inflated the balloon to 12 atm for one minute.  Completion angiogram demonstrated greater than 60% residual stenosis, so I elected to proceed with stent placement. I then used a 7 mm diameter x 26 mm length lifestream balloon expandable stent. I inflated the balloon to 12 atm. On completion angiogram following this, greater than 50% residual stenosis was identified.   At this point I was not able to pass a second lifestream stent so I advanced a angled glide catheter into the distal splenic and exchanged the wire first for an Amplatz wire but was still unable to cross the lesion  with a second stent and therefore I reinserted the glide catheter and exchanged the wire for a V 18.  Next I selected an 8 mm x 25 mm Viabahn stent advanced across the lesion and deployed the stent without  difficulty.  An 8 mm x 20 mm Mustang balloon was then advanced and angioplasty was performed to 14 atm.  Follow-up imaging now demonstrated less than 20% residual stenosis with rapid flow of contrast.  The stent extends into the aorta by several millimeters.  At this point, given the successful intervention I elected to terminate the procedure.  The sheath was removed and the brachial artery flushed proximally and distally.  It was then irrigated with heparinized saline.  The arteriotomy was then closed with four 6-0 interrupted Prolene sutures.  A single additional suture was needed to control 1 spot along the suture line at which point excellent hemostasis was achieved.  The wound was then irrigated small amount of fibrillar was placed across the suture line and the wound was then closed in layers using 3-0 Vicryl followed by 4-0 Monocryl subcuticular.  The patient was taken to the recovery room in stable condition having tolerated the procedure well.   Findings:Initially the celiac artery demonstrated subtotal occlusion greater than 95% stenosis.  Following initial stent placement there remained a high-grade residual stenotic area.  This was treated by a second Viabahn stent placement with postdilatation to 8 mm and less than 20% residual stenosis.  Disposition: Patient was taken to the recovery room in stable condition having tolerated the procedure well.  Complications: None  Hortencia Pilar 06/25/2019 4:19 PM  This note was created with Dragon Medical transcription system. Any errors in dictation are purely unintentional.

## 2019-06-25 NOTE — H&P (Signed)
[facility] History & Physical Update  The patient was interviewed and re-examined.  The patient's previous History and Physical has been reviewed and is unchanged.  There is no change in the plan of care. We plan to proceed with the scheduled procedure.  Levora Dredge, MD  06/25/2019, 1:15 PM

## 2019-06-25 NOTE — Anesthesia Procedure Notes (Signed)
Procedure Name: LMA Insertion Date/Time: 06/25/2019 1:43 PM Performed by: Henrietta Hoover, CRNA Pre-anesthesia Checklist: Patient identified, Emergency Drugs available, Patient being monitored and Suction available Patient Re-evaluated:Patient Re-evaluated prior to induction Oxygen Delivery Method: Circle system utilized Preoxygenation: Pre-oxygenation with 100% oxygen Induction Type: IV induction Ventilation: Mask ventilation without difficulty LMA: LMA inserted LMA Size: 5.0 Number of attempts: 2 (First attempt w/ LMA 4 but too small) Placement Confirmation: positive ETCO2 and breath sounds checked- equal and bilateral Tube secured with: Tape Dental Injury: Teeth and Oropharynx as per pre-operative assessment

## 2019-06-25 NOTE — Progress Notes (Addendum)
Dr schnier in to see pt aware of blood pressure up and down    No blood pressure medication at this time per dr schnier

## 2019-06-25 NOTE — Progress Notes (Signed)
Difficult to obtain accurate blood pressure  Pt has tremor to right leg and picks at arms  Surgery to right arm and broken left arm

## 2019-06-25 NOTE — Progress Notes (Signed)
Patient is asking for tylenol for his headache. Wife of patient said he takes 2 extra strength tylenol at home. Order received from Dr Gilda Crease for tylenol

## 2019-06-25 NOTE — Transfer of Care (Signed)
Immediate Anesthesia Transfer of Care Note  Patient: Oscar Brock  Procedure(s) Performed: VISCERAL ANGIOGRAPHY (N/A )  Patient Location: PACU  Anesthesia Type:General  Level of Consciousness: drowsy and patient cooperative  Airway & Oxygen Therapy: Patient Spontanous Breathing and Patient connected to face mask oxygen  Post-op Assessment: Report given to RN and Post -op Vital signs reviewed and stable  Post vital signs: Reviewed and stable  Last Vitals:  Vitals Value Taken Time  BP 115/79 06/25/19 1640  Temp    Pulse 64 06/25/19 1641  Resp 20 06/25/19 1641  SpO2 100 % 06/25/19 1641  Vitals shown include unvalidated device data.  Last Pain:  Vitals:   06/25/19 1125  PainSc: 1          Complications: No apparent anesthesia complications

## 2019-06-25 NOTE — Anesthesia Postprocedure Evaluation (Signed)
Anesthesia Post Note  Patient: Oscar Brock  Procedure(s) Performed: VISCERAL ANGIOGRAPHY (N/A )  Patient location during evaluation: PACU Anesthesia Type: General Level of consciousness: awake and alert Pain management: pain level controlled Vital Signs Assessment: post-procedure vital signs reviewed and stable Respiratory status: spontaneous breathing and respiratory function stable Cardiovascular status: stable Anesthetic complications: no     Last Vitals:  Vitals:   06/25/19 1741 06/25/19 1802  BP: (!) 170/86 (!) 166/74  Pulse: 76 71  Resp: 18 16  Temp:  36.5 C  SpO2: 100% 95%    Last Pain:  Vitals:   06/25/19 1802  TempSrc: Oral  PainSc:                  Santo Zahradnik K

## 2019-06-26 ENCOUNTER — Encounter: Payer: Self-pay | Admitting: Cardiology

## 2019-06-26 DIAGNOSIS — K551 Chronic vascular disorders of intestine: Secondary | ICD-10-CM | POA: Diagnosis not present

## 2019-06-26 DIAGNOSIS — K559 Vascular disorder of intestine, unspecified: Secondary | ICD-10-CM | POA: Diagnosis not present

## 2019-06-26 LAB — CBC
HCT: 36.6 % — ABNORMAL LOW (ref 39.0–52.0)
Hemoglobin: 12.3 g/dL — ABNORMAL LOW (ref 13.0–17.0)
MCH: 32 pg (ref 26.0–34.0)
MCHC: 33.6 g/dL (ref 30.0–36.0)
MCV: 95.3 fL (ref 80.0–100.0)
Platelets: 245 10*3/uL (ref 150–400)
RBC: 3.84 MIL/uL — ABNORMAL LOW (ref 4.22–5.81)
RDW: 14.7 % (ref 11.5–15.5)
WBC: 10.4 10*3/uL (ref 4.0–10.5)
nRBC: 0 % (ref 0.0–0.2)

## 2019-06-26 LAB — BASIC METABOLIC PANEL
Anion gap: 10 (ref 5–15)
BUN: 15 mg/dL (ref 8–23)
CO2: 23 mmol/L (ref 22–32)
Calcium: 9 mg/dL (ref 8.9–10.3)
Chloride: 108 mmol/L (ref 98–111)
Creatinine, Ser: 0.91 mg/dL (ref 0.61–1.24)
GFR calc Af Amer: >60 mL/min (ref >60)
GFR calc non Af Amer: >60 mL/min (ref >60)
Glucose, Bld: 189 mg/dL — ABNORMAL HIGH (ref 70–99)
Potassium: 4.3 mmol/L (ref 3.5–5.1)
Sodium: 141 mmol/L (ref 135–145)

## 2019-06-26 LAB — MAGNESIUM: Magnesium: 1.9 mg/dL (ref 1.7–2.4)

## 2019-06-26 LAB — GLUCOSE, CAPILLARY
Glucose-Capillary: 167 mg/dL — ABNORMAL HIGH (ref 70–99)
Glucose-Capillary: 144 mg/dL — ABNORMAL HIGH (ref 70–99)

## 2019-06-26 MED ORDER — ONDANSETRON HCL 4 MG/2ML IJ SOLN: 4.0000 mg | Freq: Once | INTRAMUSCULAR | Status: DC

## 2019-06-26 MED ORDER — ALUM & MAG HYDROXIDE-SIMETH 200-200-20 MG/5ML PO SUSP: 30.0000 mL | ORAL | Status: DC | PRN

## 2019-06-26 MED ORDER — TRAMADOL HCL 50 MG PO TABS: 50.0000 mg | ORAL_TABLET | Freq: Four times a day (QID) | ORAL | 0 refills | Status: DC | PRN

## 2019-06-26 MED ORDER — ALPRAZOLAM 0.25 MG PO TABS
0.2500 mg | ORAL_TABLET | Freq: Three times a day (TID) | ORAL | Status: DC | PRN
  Administered 2019-06-26: 0.25 mg via ORAL
  Filled 2019-06-26: qty 1

## 2019-06-26 NOTE — Discharge Summary (Signed)
[facility]    Discharge Summary  Patient ID:  Oscar Brock MRN: [id number] DOB/AGE: 1940/03/28 79 y.o.  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 Anxiety   . Arthritis   . Asthma   . Atrial fibrillation (HCC)   . 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 COPD (chronic obstructive pulmonary disease) (HCC)   . Dementia (HCC)   . Diabetes mellitus without complication (HCC)   . Dyspnea   . GERD (gastroesophageal reflux disease)   . Headache    every morning on waking  . HTN (hypertension) 04/11/2017  . Hyperlipidemia 02/18/2009  . Hypertension   . Kidney stones   . 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 Paralysis (HCC)   . Presence of permanent cardiac pacemaker   . Sleep apnea   . Stroke (HCC)   . Tremor 07/12/2016   Procedure(s): 06/25/19 1. Cutdown right brachial artery 2. Catheter placement into splenic artery from right brachial artery approach 3. Aortogram and selective angiogram of the splenic artery             4.  Percutaneous transluminal angioplasty of the celiac trunk with a 5 mm diameter x 40 mm length angioplasty balloon 5. Stent to the celiac trunk with 7 mm diameter x 26 mm length lifestream balloon expandable stent with additional 8 mm x 25 mm Viabahn stent placement  Discharged Condition: Good  HPI / Hospital Course:  Patient is a 79 y.o. male who has symptoms consistent with mesenteric ischemia. 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 The patient is brought in for angiography for further evaluation and potential treatment. On 06/25/19,  the patient underwent:  1. Cutdown right brachial artery 2. Catheter placement into splenic artery from right brachial artery approach 3. Aortogram and selective angiogram of the splenic artery             4.  Percutaneous transluminal angioplasty of the celiac trunk with a 5 mm diameter x 40 mm length angioplasty balloon 5. Stent to the celiac trunk with 7 mm diameter x 26 mm length lifestream balloon expandable stent with additional 8 mm x 25 mm Viabahn stent placement  The patient tolerated the procedure well was transferred from the recovery room to the surgical floor without issue.  The patient's night of procedure was unremarkable. POD#1, the patient was tolerating a regular diet, he was urinating independently, discomfort was controlled through the use of PO pain medication and he was ambulating at baseline. At discharge, the patient was afebrile, stable vital sign and an unremarkable physical exam.  Extubated: POD # 0  Physical exam:  A&Ox3, NAD CV: RRR Pulmonary: CTA bilaterally Abdomen: Soft, non-tender, non-distended (+) bowel sounds Vascular:  Right Upper Extremity: Incision - clean and dry. Dermbond intact. 2+ radial pulse. Hand warm.   Labs as below  Complications: None  Consults: None  Significant Diagnostic Studies: CBC Lab Results  Component Value Date   WBC 10.4 06/26/2019   HGB 12.3 (L) 06/26/2019   HCT 36.6 (L) 06/26/2019   MCV 95.3 06/26/2019   PLT 245 06/26/2019   BMET    Component Value Date/Time   NA 141 06/26/2019 0517   NA 143 09/20/2011  0433   K 4.3 06/26/2019 0517   K 4.4 09/20/2011 0433   CL 108 06/26/2019 0517   CL 108 (H) 09/20/2011 0433   CO2 23 06/26/2019 0517   CO2 25 09/20/2011 0433   GLUCOSE 189 (H) 06/26/2019 0517   GLUCOSE 106 (H) 09/20/2011 0433   BUN 15 06/26/2019 0517   BUN 13 09/20/2011 0433   CREATININE 0.91 06/26/2019 0517   CREATININE 0.87 09/20/2011 0433   CALCIUM 9.0  06/26/2019 0517   CALCIUM 8.6 09/20/2011 0433   GFRNONAA >60 06/26/2019 0517   GFRNONAA >60 09/20/2011 0433   GFRAA >60 06/26/2019 0517   GFRAA >60 09/20/2011 0433   COAG Lab Results  Component Value Date   INR 2.1 09/19/2011   INR 1.9 08/04/2011   INR 1.8 08/03/2011   Disposition:  Discharge to :Home  Allergies as of 06/26/2019      Reactions   Effexor Xr [venlafaxine Hcl Er] Other (See Comments)   unknown   Lisinopril Other (See Comments)   unknown   Other Other (See Comments)   Jitters/nervous   Oxycodone-acetaminophen Other (See Comments)   Jitters wild   Budesonide-formoterol Fumarate    Other reaction(s): Other (See Comments) hoarseness   Fluticasone    Other reaction(s): Other (See Comments) hoarseness   Propoxyphene Other (See Comments)   Jittery/nervous Jitters/nervous      Medication List    TAKE these medications   acetaminophen 500 MG tablet Commonly known as: TYLENOL Take 500-1,000 mg by mouth 4 (four) times daily as needed for mild pain or fever.   albuterol (2.5 MG/3ML) 0.083% nebulizer solution Commonly known as: PROVENTIL Take 2.5 mg by nebulization every 6 (six) hours as needed for wheezing or shortness of breath.   albuterol 108 (90 Base) MCG/ACT inhaler Commonly known as: VENTOLIN HFA Inhale 2 puffs into the lungs every 4 (four) hours as needed for wheezing or shortness of breath.   ALPRAZolam 0.25 MG tablet Commonly known as: XANAX Take 0.25 mg by mouth 3 (three) times daily as needed for anxiety.   apixaban 5 MG Tabs tablet Commonly known as: ELIQUIS Take 5 mg by mouth 2 (two) times daily.   aspirin EC 81 MG tablet Take 81 mg by mouth daily.   atorvastatin 40 MG tablet Commonly known as: LIPITOR Take 40 mg by mouth at bedtime.   Biotene Dry Mouth Moisturizing Soln Take 1 spray by mouth every 2 (two) hours as needed (dry mouth).   bisacodyl 5 MG EC tablet Commonly known as: DULCOLAX Take 5 mg by mouth daily as needed for  mild constipation.   Blue-Emu Hemp 10 % cream Generic drug: trolamine salicylate Apply 1 application topically daily as needed for muscle pain.   docusate sodium 100 MG capsule Commonly known as: COLACE Take 100 mg by mouth daily.   DULoxetine 60 MG capsule Commonly known as: CYMBALTA Take 60 mg by mouth daily.   famotidine 40 MG tablet Commonly known as: PEPCID Take 1 tablet (40 mg total) by mouth 2 (two) times daily.   finasteride 5 MG tablet Commonly known as: PROSCAR Take 5 mg by mouth at bedtime.   folic acid 1 MG tablet Commonly known as: FOLVITE Take 1 mg by mouth daily.   gabapentin 300 MG capsule Commonly known as: NEURONTIN Take 300 mg by mouth at bedtime.   HumaLOG KwikPen 100 UNIT/ML KwikPen Generic drug: insulin lispro Inject 1-3 Units into the skin every evening. Sliding scale   Kaopectate 262 MG/15ML suspension  Generic drug: bismuth subsalicylate Take 30 mLs by mouth every 6 (six) hours as needed for indigestion. kaopectate   Levemir FlexTouch 100 UNIT/ML FlexPen Generic drug: insulin detemir Inject 20 Units into the skin daily.   levothyroxine 125 MCG tablet Commonly known as: SYNTHROID Take 125 mcg by mouth daily.   magnesium hydroxide 400 MG/5ML suspension Commonly known as: MILK OF MAGNESIA Take 30 mLs by mouth daily as needed for mild constipation.   methotrexate (PF) 200 MG/8ML injection Inject 50 mg/m2 into the skin every Sunday. 0.6 ml   metoCLOPramide 10 MG tablet Commonly known as: Reglan Take 1 tablet (10 mg total) by mouth 3 (three) times daily before meals.   metoprolol succinate 25 MG 24 hr tablet Commonly known as: TOPROL-XL Take 25 mg by mouth 2 (two) times daily.   mirtazapine 15 MG tablet Commonly known as: REMERON Take 30 mg by mouth at bedtime.   Mylanta 200-200-20 MG/5ML suspension Generic drug: alum & mag hydroxide-simeth Take 30 mLs by mouth every 4 (four) hours as needed for indigestion or heartburn.    polyethylene glycol 17 g packet Commonly known as: MIRALAX / GLYCOLAX Take 17 g by mouth daily as needed for mild constipation.   senna-docusate 8.6-50 MG tablet Commonly known as: Senokot-S Take 2 tablets by mouth at bedtime.   sucralfate 1 g tablet Commonly known as: Carafate Take 1 tablet (1 g total) by mouth 4 (four) times daily for 10 days.   traMADol 50 MG tablet Commonly known as: ULTRAM Take 50 mg by mouth in the morning, at noon, and at bedtime. What changed: Another medication with the same name was added. Make sure you understand how and when to take each.   traMADol 50 MG tablet Commonly known as: ULTRAM Take 1 tablet (50 mg total) by mouth every 6 (six) hours as needed for moderate pain or severe pain. What changed: You were already taking a medication with the same name, and this prescription was added. Make sure you understand how and when to take each.   Voltaren 1 % Gel Generic drug: diclofenac Sodium Apply 2 g topically daily as needed (Shoulder pain).      Verbal and written Discharge instructions given to the patient. Wound care per Discharge AVS Follow-up Information    Schnier, Dolores Lory, MD Follow up in 2 week(s).   Specialties: Vascular Surgery, Cardiology, Radiology, Vascular Surgery Why: Can see Schnier or Arna Medici. Will need mesenteric duplex with visit.  Contact information: [place] [address] [phone]          Signed: Sela Hua, PA-C  06/26/2019, 2:12 PM

## 2019-06-26 NOTE — Progress Notes (Signed)
Pt could not comprehend instructions after multiple attempts

## 2019-07-01 ENCOUNTER — Telehealth (INDEPENDENT_AMBULATORY_CARE_PROVIDER_SITE_OTHER): Payer: Self-pay

## 2019-07-01 NOTE — Telephone Encounter (Signed)
Spoke with the patient's wife and she was given the recommendations from Sheppard Plumber NP. Patient's wife understood the information and was also advised that if the patient was to get worse to call our office back.

## 2019-07-01 NOTE — Telephone Encounter (Signed)
Patient's wife called in stating the patient has been complaining of stomach pain for the past 3 days, that starts at his back then moves around to his stomach.  She stated that is all he has complained about. Patient had a Visceral angiogram on 06/25/19 with Dr. Gilda Crease for Mesenteric ischemia. Patient's wife thinks he should have an MRI. Patient had a CT abdomen and pelvis on 05/15/19 and CTA Chest as  Well.

## 2019-07-01 NOTE — Telephone Encounter (Signed)
At this time an MRI wouldn't be necessary.  Following a visceral angiogram, abdominal pain is common due to the fact that the abdomen and intestines swell after regaining blood flow.  Therefore abdominal pain is very common after stent placement.  Tylenol is helpful and if she has used that and it has not worked, we can send in some tramadol.

## 2019-07-09 ENCOUNTER — Other Ambulatory Visit (INDEPENDENT_AMBULATORY_CARE_PROVIDER_SITE_OTHER): Payer: Self-pay | Admitting: Vascular Surgery

## 2019-07-09 DIAGNOSIS — I771 Stricture of artery: Secondary | ICD-10-CM

## 2019-07-09 DIAGNOSIS — I774 Celiac artery compression syndrome: Secondary | ICD-10-CM

## 2019-07-09 DIAGNOSIS — K551 Chronic vascular disorders of intestine: Secondary | ICD-10-CM

## 2019-07-09 DIAGNOSIS — Z95820 Peripheral vascular angioplasty status with implants and grafts: Secondary | ICD-10-CM

## 2019-07-10 ENCOUNTER — Ambulatory Visit (INDEPENDENT_AMBULATORY_CARE_PROVIDER_SITE_OTHER): Payer: Medicare Other | Admitting: Nurse Practitioner

## 2019-07-10 ENCOUNTER — Other Ambulatory Visit: Payer: Self-pay

## 2019-07-10 ENCOUNTER — Encounter (INDEPENDENT_AMBULATORY_CARE_PROVIDER_SITE_OTHER): Payer: Self-pay | Admitting: Nurse Practitioner

## 2019-07-10 ENCOUNTER — Ambulatory Visit (INDEPENDENT_AMBULATORY_CARE_PROVIDER_SITE_OTHER): Payer: Medicare Other

## 2019-07-10 VITALS — BP 104/70 | HR 60 | Ht 75.0 in | Wt 208.0 lb

## 2019-07-10 DIAGNOSIS — I774 Celiac artery compression syndrome: Secondary | ICD-10-CM

## 2019-07-10 DIAGNOSIS — I10 Essential (primary) hypertension: Secondary | ICD-10-CM | POA: Diagnosis not present

## 2019-07-10 DIAGNOSIS — K551 Chronic vascular disorders of intestine: Secondary | ICD-10-CM

## 2019-07-10 DIAGNOSIS — Z95820 Peripheral vascular angioplasty status with implants and grafts: Secondary | ICD-10-CM | POA: Diagnosis not present

## 2019-07-10 DIAGNOSIS — I771 Stricture of artery: Secondary | ICD-10-CM

## 2019-07-10 DIAGNOSIS — Z8673 Personal history of transient ischemic attack (TIA), and cerebral infarction without residual deficits: Secondary | ICD-10-CM

## 2019-07-14 ENCOUNTER — Encounter (INDEPENDENT_AMBULATORY_CARE_PROVIDER_SITE_OTHER): Payer: Self-pay | Admitting: Nurse Practitioner

## 2019-07-14 NOTE — Progress Notes (Signed)
Subjective:    Patient ID: Oscar Brock, male    DOB: 1940/07/03, 78 y.o.   MRN: [id number] Chief Complaint  Patient presents with  . Follow-up    U/S Follow up    The patient presents today after mesenteric angiogram done on 06/25/2019.  The patient had an angioplasty of the celiac trunk as well as a stent to the celiac trunk.  Prior to intervention the patient had a 95% stenosis of the celiac artery.  Prior to intervention the patient had severe abdominal pain.  The patient actually continues to have abdominal pain today.  However per the patient's wife the abdominal pain complaints have subsided.  It seems that the patient's abdominal pain is going better.  He denies any fever, chills, nausea, vomiting or diarrhea.  No issues with antiplatelet therapy.  Today noninvasive study showed the largest aortic diameter at 2.4 cm.  There is normal celiac artery and superior mesenteric artery findings.  Due to mobility issues as well as the patient being able to lay flat completely, the hepatic artery and splenic artery have some limited visualization.  Gas in the exam as well.  Previously stent placed in the celiac artery region is patent.   Review of Systems  Gastrointestinal: Positive for abdominal pain.  Neurological: Positive for weakness.  All other systems reviewed and are negative.      Objective:   Physical Exam Vitals reviewed. Exam conducted with a chaperone present (Wife present).  Constitutional:      Appearance: Normal appearance.  Cardiovascular:     Rate and Rhythm: Normal rate and regular rhythm.  Abdominal:     General: Abdomen is flat. Bowel sounds are normal.  Neurological:     Mental Status: He is alert.     Motor: Weakness (Left-sided) present.     BP 104/70   Pulse 60   Ht 6\' 3"  (1.905 m)   Wt 208 lb (94.3 kg)   BMI 26.00 kg/m   Past Medical History:  Diagnosis Date  . Anxiety   . Arthritis   . Asthma   . Atrial fibrillation (HCC)   . Carotid  stenosis, symptomatic, with infarction (HCC) 02/13/2017  . COPD (chronic obstructive pulmonary disease) (HCC)   . Dementia (HCC)   . Diabetes mellitus without complication (HCC)   . Dyspnea   . GERD (gastroesophageal reflux disease)   . Headache    every morning on waking  . HTN (hypertension) 04/11/2017  . Hyperlipidemia 02/18/2009  . Hypertension   . Kidney stones   . 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 Paralysis (HCC)   . Presence of permanent cardiac pacemaker   . Sleep apnea   . Stroke (HCC)   . Tremor 07/12/2016    Social History   Socioeconomic History  . Marital status: Married    Spouse name: Not on file  . Number of children: Not on file  . Years of education: Not on file  . Highest education level: Not on file  Occupational History  . Not on file  Tobacco Use  . Smoking status: Former Smoker    Quit date: 1989    Years since quitting: 32.3  . Smokeless tobacco: Never Used  Substance and Sexual Activity  . Alcohol use: No  . Drug use: No  . Sexual activity: Not on file  Other Topics Concern  . Not on file  Social History Narrative  . Not on file   Social Determinants of Health   Financial  Resource Strain:   . Difficulty of Paying Living Expenses:   Food Insecurity:   . Worried About Programme researcher, broadcasting/film/video in the Last Year:   . Barista in the Last Year:   Transportation Needs:   . Freight forwarder (Medical):   Marland Kitchen Lack of Transportation (Non-Medical):   Physical Activity:   . Days of Exercise per Week:   . Minutes of Exercise per Session:   Stress:   . Feeling of Stress :   Social Connections:   . Frequency of Communication with Friends and Family:   . Frequency of Social Gatherings with Friends and Family:   . Attends Religious Services:   . Active Member of Clubs or Organizations:   . Attends Banker Meetings:   Marland Kitchen Marital Status:   Intimate Partner Violence:   . Fear of Current or Ex-Partner:   . Emotionally Abused:   Marland Kitchen  Physically Abused:   . Sexually Abused:     Past Surgical History:  Procedure Laterality Date  . APPENDECTOMY    . CORONARY ANGIOPLASTY    . ESOPHAGOGASTRODUODENOSCOPY    . ESOPHAGOGASTRODUODENOSCOPY (EGD) WITH PROPOFOL N/A 06/10/2019   Procedure: ESOPHAGOGASTRODUODENOSCOPY (EGD) WITH PROPOFOL;  Surgeon: [name], MD;  Location: [facility] ENDOSCOPY;  Service: Gastroenterology;  Laterality: N/A;  . HERNIA REPAIR    . PACEMAKER INSERTION    . PACEMAKER INSERTION    . PEG PLACEMENT N/A 09/26/2017   Procedure: PERCUTANEOUS ENDOSCOPIC GASTROSTOMY (PEG) PLACEMENT;  Surgeon: Midge Minium, MD;  Location: [facility] ENDOSCOPY;  Service: Endoscopy;  Laterality: N/A;  . PEG TUBE PLACEMENT    . VISCERAL ANGIOGRAPHY N/A 06/11/2019   Procedure: VISCERAL ANGIOGRAPHY;  Surgeon: Renford Dills, MD;  Location: [facility] INVASIVE CV LAB;  Service: Cardiovascular;  Laterality: N/A;  . VISCERAL ANGIOGRAPHY N/A 06/25/2019   Procedure: VISCERAL ANGIOGRAPHY;  Surgeon: Renford Dills, MD;  Location: [facility] INVASIVE CV LAB;  Service: Cardiovascular;  Laterality: N/A;    Family History  Problem Relation Age of Onset  . Heart failure Mother   . Clotting disorder Mother   . Heart failure Father   . Hypertension Father   . Diabetes Father   . Stroke Brother   . Lung cancer Sister     Allergies  Allergen Reactions  . Effexor Xr [Venlafaxine Hcl Er] Other (See Comments)    unknown  . Lisinopril Other (See Comments)    unknown  . Other Other (See Comments)    Jitters/nervous  . Oxycodone-Acetaminophen Other (See Comments)    Jitters wild  . Budesonide-Formoterol Fumarate     Other reaction(s): Other (See Comments) hoarseness  . Fluticasone     Other reaction(s): Other (See Comments) hoarseness  . Propoxyphene Other (See Comments)    Jittery/nervous Jitters/nervous        Assessment & Plan:   1. Celiac artery stenosis (HCC) Today noninvasive studies show the celiac artery stenosis was minimal  following angiogram.  Based on this the patient's continued abdominal pain could be related to inflammatory changes at a typical after celiac intervention.  The patient did have a 95% stenosis prior to intervention.  We will have the patient return in 3 to 4 weeks to follow-up to evaluate the patient's pain.  The pain has decreased and the wound will continue with conservative management however the pain has remained the same or gotten worse we may consider further noninvasive testing.  2. History of stroke Patient still has residual left sided weakness.  There is the possibility that there could be altered nerve sensation causing some of the abdominal discomfort.   3. Essential hypertension Continue antihypertensive medications as already ordered, these medications have been reviewed and there are no changes at this time.    Current Outpatient Medications on File Prior to Visit  Medication Sig Dispense Refill  . acetaminophen (TYLENOL) 500 MG tablet Take 500-1,000 mg by mouth 4 (four) times daily as needed for mild pain or fever.     Marland Kitchen albuterol (PROVENTIL) (2.5 MG/3ML) 0.083% nebulizer solution Take 2.5 mg by nebulization every 6 (six) hours as needed for wheezing or shortness of breath.     Marland Kitchen albuterol (VENTOLIN HFA) 108 (90 Base) MCG/ACT inhaler Inhale 2 puffs into the lungs every 4 (four) hours as needed for wheezing or shortness of breath.    . ALPRAZolam (XANAX) 0.25 MG tablet Take 0.25 mg by mouth 3 (three) times daily as needed for anxiety.     Marland Kitchen alum & mag hydroxide-simeth (MYLANTA) 200-200-20 MG/5ML suspension Take 30 mLs by mouth every 4 (four) hours as needed for indigestion or heartburn.    Marland Kitchen apixaban (ELIQUIS) 5 MG TABS tablet Take 5 mg by mouth 2 (two) times daily.    Marland Kitchen apixaban (ELIQUIS) 5 MG TABS tablet Take by mouth.    . Artificial Saliva (BIOTENE DRY MOUTH MOISTURIZING) SOLN Take 1 spray by mouth every 2 (two) hours as needed (dry mouth).     Marland Kitchen aspirin EC 81 MG tablet Take 81  mg by mouth daily.     Marland Kitchen atorvastatin (LIPITOR) 40 MG tablet Take 40 mg by mouth at bedtime.     . bisacodyl (DULCOLAX) 5 MG EC tablet Take 5 mg by mouth daily as needed for mild constipation.     . bismuth subsalicylate (KAOPECTATE) 262 MG/15ML suspension Take 30 mLs by mouth every 6 (six) hours as needed for indigestion. kaopectate    . diclofenac Sodium (VOLTAREN) 1 % GEL Apply 2 g topically daily as needed (Shoulder pain).     Marland Kitchen docusate sodium (COLACE) 100 MG capsule Take 100 mg by mouth daily.    . DULoxetine (CYMBALTA) 60 MG capsule Take 60 mg by mouth daily.     . famotidine (PEPCID) 40 MG tablet Take 1 tablet (40 mg total) by mouth 2 (two) times daily. 180 tablet 1  . finasteride (PROSCAR) 5 MG tablet Take 5 mg by mouth at bedtime.     . folic acid (FOLVITE) 1 MG tablet Take 1 mg by mouth daily.    Marland Kitchen gabapentin (NEURONTIN) 300 MG capsule Take 300 mg by mouth at bedtime.     Marland Kitchen HUMALOG KWIKPEN 100 UNIT/ML KiwkPen Inject 1-3 Units into the skin every evening. Sliding scale    . Insulin Detemir (LEVEMIR FLEXTOUCH) 100 UNIT/ML Pen Inject 20 Units into the skin daily.     Marland Kitchen levothyroxine (SYNTHROID) 125 MCG tablet Take 125 mcg by mouth daily.     . magnesium hydroxide (MILK OF MAGNESIA) 400 MG/5ML suspension Take 30 mLs by mouth daily as needed for mild constipation.    . Methotrexate Sodium (METHOTREXATE, PF,) 200 MG/8ML injection Inject 50 mg/m2 into the skin every Sunday. 0.6 ml    . metoprolol succinate (TOPROL-XL) 25 MG 24 hr tablet Take 25 mg by mouth 2 (two) times daily.    . mirtazapine (REMERON) 15 MG tablet Take 30 mg by mouth at bedtime.     . polyethylene glycol (MIRALAX / GLYCOLAX) 17 g packet Take  17 g by mouth daily as needed for mild constipation.    . senna-docusate (SENOKOT-S) 8.6-50 MG tablet Take 2 tablets by mouth at bedtime.     . traMADol (ULTRAM) 50 MG tablet Take 1 tablet (50 mg total) by mouth every 6 (six) hours as needed for moderate pain or severe pain. 30 tablet 0   . trolamine salicylate (BLUE-EMU HEMP) 10 % cream Apply 1 application topically daily as needed for muscle pain.    Marland Kitchen metoCLOPramide (REGLAN) 10 MG tablet Take 1 tablet (10 mg total) by mouth 3 (three) times daily before meals. (Patient not taking: Reported on 07/10/2019) 21 tablet 1  . sucralfate (CARAFATE) 1 g tablet Take 1 tablet (1 g total) by mouth 4 (four) times daily for 10 days. (Patient not taking: Reported on 06/11/2019) 40 tablet 0  . traMADol (ULTRAM) 50 MG tablet Take 50 mg by mouth in the morning, at noon, and at bedtime.      No current facility-administered medications on file prior to visit.    There are no Patient Instructions on file for this visit. No follow-ups on file.   Georgiana Spinner, NP

## 2019-08-01 ENCOUNTER — Other Ambulatory Visit: Payer: Self-pay

## 2019-08-01 ENCOUNTER — Encounter (INDEPENDENT_AMBULATORY_CARE_PROVIDER_SITE_OTHER): Payer: Self-pay | Admitting: Nurse Practitioner

## 2019-08-01 ENCOUNTER — Ambulatory Visit (INDEPENDENT_AMBULATORY_CARE_PROVIDER_SITE_OTHER): Payer: Medicare Other | Admitting: Nurse Practitioner

## 2019-08-01 VITALS — BP 122/81 | HR 75 | Ht 75.0 in | Wt 208.0 lb

## 2019-08-01 DIAGNOSIS — I10 Essential (primary) hypertension: Secondary | ICD-10-CM | POA: Diagnosis not present

## 2019-08-01 DIAGNOSIS — R1033 Periumbilical pain: Secondary | ICD-10-CM

## 2019-08-01 DIAGNOSIS — K551 Chronic vascular disorders of intestine: Secondary | ICD-10-CM

## 2019-08-01 DIAGNOSIS — E782 Mixed hyperlipidemia: Secondary | ICD-10-CM | POA: Diagnosis not present

## 2019-08-01 NOTE — Progress Notes (Signed)
Subjective:    Patient ID: Oscar Brock, male    DOB: Jul 24, 1940, 79 y.o.   MRN: [id number] Chief Complaint  Patient presents with  . Follow-up    3 wk no studies    Today the patient returns to follow-up regarding his abdominal pain after his mesenteric angiogram.  The patient continues to have abdominal pain.  He denies any actual nausea or vomiting however after taking 1 or 2 bites of food he does feel like he just cannot eat anymore.  The pain is mostly situated around his periumbilical area.  He also notices some small nodules in that area.  It is not painful to palpation just slightly tender.  There is a previous finding of mesenteric lymphadenopathy which the patient has an upcoming appointment with oncology for.  The patient also notes having some severe back pain that was relieved after he defecated.  The patient was impacted previously and once he was able to relieve himself the back pain decreased significantly.     Review of Systems  Gastrointestinal: Positive for abdominal pain.  Musculoskeletal: Positive for back pain.  Neurological: Positive for facial asymmetry, speech difficulty, weakness and numbness.  All other systems reviewed and are negative.      Objective:   Physical Exam Vitals reviewed.  HENT:     Head: Normocephalic.  Cardiovascular:     Rate and Rhythm: Normal rate.  Abdominal:     General: Abdomen is flat.     Palpations: Abdomen is soft.  Neurological:     Mental Status: He is alert.     Motor: Weakness present.     Coordination: Coordination abnormal.     BP 122/81   Pulse 75   Ht 6\' 3"  (1.905 m)   Wt 208 lb (94.3 kg)   BMI 26.00 kg/m   Past Medical History:  Diagnosis Date  . Anxiety   . Arthritis   . Asthma   . Atrial fibrillation (HCC)   . 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 COPD (chronic obstructive pulmonary disease) (HCC)   . Dementia (HCC)   . Diabetes mellitus without complication (HCC)    . Dyspnea   . GERD (gastroesophageal reflux disease)   . Headache    every morning on waking  . HTN (hypertension) 04/11/2017  . Hyperlipidemia 02/18/2009  . Hypertension   . Kidney stones   . 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 Paralysis (HCC)   . Presence of permanent cardiac pacemaker   . Sleep apnea   . Stroke (HCC)   . Tremor 07/12/2016    Social History   Socioeconomic History  . Marital status: Married    Spouse name: Not on file  . Number of children: Not on file  . Years of education: Not on file  . Highest education level: Not on file  Occupational History  . Not on file  Tobacco Use  . Smoking status: Former Smoker    Quit date: 1989    Years since quitting: 32.4  . Smokeless tobacco: Never Used  Substance and Sexual Activity  . Alcohol use: No  . Drug use: No  . Sexual activity: Not on file  Other Topics Concern  . Not on file  Social History Narrative  . Not on file   Social Determinants of Health   Financial Resource Strain:   . Difficulty of Paying Living Expenses:   Food Insecurity:   . Worried About 09/11/2016 in the  Last Year:   . Ran Out of Food in the Last Year:   Transportation Needs:   . Freight forwarder (Medical):   Marland Kitchen Lack of Transportation (Non-Medical):   Physical Activity:   . Days of Exercise per Week:   . Minutes of Exercise per Session:   Stress:   . Feeling of Stress :   Social Connections:   . Frequency of Communication with Friends and Family:   . Frequency of Social Gatherings with Friends and Family:   . Attends Religious Services:   . Active Member of Clubs or Organizations:   . Attends Banker Meetings:   Marland Kitchen Marital Status:   Intimate Partner Violence:   . Fear of Current or Ex-Partner:   . Emotionally Abused:   Marland Kitchen Physically Abused:   . Sexually Abused:     Past Surgical History:  Procedure Laterality Date  . APPENDECTOMY    . CORONARY ANGIOPLASTY    . ESOPHAGOGASTRODUODENOSCOPY    .  ESOPHAGOGASTRODUODENOSCOPY (EGD) WITH PROPOFOL N/A 06/10/2019   Procedure: ESOPHAGOGASTRODUODENOSCOPY (EGD) WITH PROPOFOL;  Surgeon: [name], MD;  Location: [facility] ENDOSCOPY;  Service: Gastroenterology;  Laterality: N/A;  . HERNIA REPAIR    . PACEMAKER INSERTION    . PACEMAKER INSERTION    . PEG PLACEMENT N/A 09/26/2017   Procedure: PERCUTANEOUS ENDOSCOPIC GASTROSTOMY (PEG) PLACEMENT;  Surgeon: Midge Minium, MD;  Location: [facility] ENDOSCOPY;  Service: Endoscopy;  Laterality: N/A;  . PEG TUBE PLACEMENT    . VISCERAL ANGIOGRAPHY N/A 06/11/2019   Procedure: VISCERAL ANGIOGRAPHY;  Surgeon: Renford Dills, MD;  Location: [facility] INVASIVE CV LAB;  Service: Cardiovascular;  Laterality: N/A;  . VISCERAL ANGIOGRAPHY N/A 06/25/2019   Procedure: VISCERAL ANGIOGRAPHY;  Surgeon: Renford Dills, MD;  Location: [facility] INVASIVE CV LAB;  Service: Cardiovascular;  Laterality: N/A;    Family History  Problem Relation Age of Onset  . Heart failure Mother   . Clotting disorder Mother   . Heart failure Father   . Hypertension Father   . Diabetes Father   . Stroke Brother   . Lung cancer Sister     Allergies  Allergen Reactions  . Effexor Xr [Venlafaxine Hcl Er] Other (See Comments)    unknown  . Lisinopril Other (See Comments)    unknown  . Other Other (See Comments)    Jitters/nervous  . Oxycodone-Acetaminophen Other (See Comments)    Jitters wild  . Budesonide-Formoterol Fumarate     Other reaction(s): Other (See Comments) hoarseness  . Fluticasone     Other reaction(s): Other (See Comments) hoarseness  . Propoxyphene Other (See Comments)    Jittery/nervous Jitters/nervous        Assessment & Plan:   1. Periumbilical abdominal pain Despite celiac artery intervention the patient still continues to have abdominal pain.  There are possible multiple causes.  The mesenteric lymphadenopathy may be a portion however his issues with constipation may also be a factor.  At this time we will refer  the patient back to gastroenterology for further work-up.  The patient also has an appointment with oncology. - Ambulatory referral to Gastroenterology  2. Mesenteric artery stenosis (HCC) Previous noninvasive studies show the celiac artery stenosis was minimal following angiogram.    Initially there was concern that his continued abdominal pain may be related to inflammatory changes that typically afternoon after celiac intervention.  The patient continues to have pain within his periumbilical region, and additional descriptors or not quite consistent with mesenteric stenosis.  Previous noninvasive  studies indicate that his mesenteric stenosis is minimal so it may be more so related to issues discussed above.  We will have the patient return to the office in 3 months with noninvasive studies, sooner if further testing is necessary.  3. Essential hypertension Continue antihypertensive medications as already ordered, these medications have been reviewed and there are no changes at this time.   4. Mixed hyperlipidemia Continue statin as ordered and reviewed, no changes at this time    Current Outpatient Medications on File Prior to Visit  Medication Sig Dispense Refill  . acetaminophen (TYLENOL) 500 MG tablet Take 500-1,000 mg by mouth 4 (four) times daily as needed for mild pain or fever.     Marland Kitchen albuterol (PROVENTIL) (2.5 MG/3ML) 0.083% nebulizer solution Take 2.5 mg by nebulization every 6 (six) hours as needed for wheezing or shortness of breath.     Marland Kitchen albuterol (VENTOLIN HFA) 108 (90 Base) MCG/ACT inhaler Inhale 2 puffs into the lungs every 4 (four) hours as needed for wheezing or shortness of breath.    . ALPRAZolam (XANAX) 0.25 MG tablet Take 0.25 mg by mouth 3 (three) times daily as needed for anxiety.     Marland Kitchen apixaban (ELIQUIS) 5 MG TABS tablet Take 5 mg by mouth 2 (two) times daily.    Marland Kitchen apixaban (ELIQUIS) 5 MG TABS tablet Take by mouth.    . Artificial Saliva (BIOTENE DRY MOUTH  MOISTURIZING) SOLN Take 1 spray by mouth every 2 (two) hours as needed (dry mouth).     Marland Kitchen aspirin EC 81 MG tablet Take 81 mg by mouth daily.     Marland Kitchen atorvastatin (LIPITOR) 40 MG tablet Take 40 mg by mouth at bedtime.     . bismuth subsalicylate (KAOPECTATE) 262 MG/15ML suspension Take 30 mLs by mouth every 6 (six) hours as needed for indigestion. kaopectate    . collagenase (SANTYL) ointment Apply topically.    . diclofenac Sodium (VOLTAREN) 1 % GEL Apply 2 g topically daily as needed (Shoulder pain).     . DULoxetine (CYMBALTA) 60 MG capsule Take 60 mg by mouth daily.     . famotidine (PEPCID) 40 MG tablet Take 1 tablet (40 mg total) by mouth 2 (two) times daily. 180 tablet 1  . finasteride (PROSCAR) 5 MG tablet Take 5 mg by mouth at bedtime.     . folic acid (FOLVITE) 1 MG tablet Take 1 mg by mouth daily.    Marland Kitchen gabapentin (NEURONTIN) 300 MG capsule Take 300 mg by mouth at bedtime.     Marland Kitchen HUMALOG KWIKPEN 100 UNIT/ML KiwkPen Inject 1-3 Units into the skin every evening. Sliding scale    . Insulin Detemir (LEVEMIR FLEXTOUCH) 100 UNIT/ML Pen Inject 20 Units into the skin daily.     Marland Kitchen levothyroxine (SYNTHROID) 125 MCG tablet Take 125 mcg by mouth daily.     . magnesium hydroxide (MILK OF MAGNESIA) 400 MG/5ML suspension Take 30 mLs by mouth daily as needed for mild constipation.    . melatonin 5 MG TABS Take by mouth.    . Methotrexate Sodium (METHOTREXATE, PF,) 200 MG/8ML injection Inject 50 mg/m2 into the skin every Sunday. 0.6 ml    . metoprolol succinate (TOPROL-XL) 25 MG 24 hr tablet Take 25 mg by mouth 2 (two) times daily.    . mirtazapine (REMERON) 15 MG tablet Take 30 mg by mouth at bedtime.     . polyethylene glycol (MIRALAX / GLYCOLAX) 17 g packet Take 17 g by mouth daily as  needed for mild constipation.    . silver sulfADIAZINE (SILVADENE) 1 % cream Apply topically.    . traMADol (ULTRAM) 50 MG tablet Take 50 mg by mouth in the morning, at noon, and at bedtime.     . trolamine salicylate  (BLUE-EMU HEMP) 10 % cream Apply 1 application topically daily as needed for muscle pain.    . bisacodyl (DULCOLAX) 5 MG EC tablet Take 5 mg by mouth daily as needed for mild constipation.     . docusate sodium (COLACE) 100 MG capsule Take 100 mg by mouth daily.    . metoCLOPramide (REGLAN) 10 MG tablet Take 1 tablet (10 mg total) by mouth 3 (three) times daily before meals. (Patient not taking: Reported on 07/10/2019) 21 tablet 1  . senna-docusate (SENOKOT-S) 8.6-50 MG tablet Take 2 tablets by mouth at bedtime.     . sucralfate (CARAFATE) 1 g tablet Take 1 tablet (1 g total) by mouth 4 (four) times daily for 10 days. (Patient not taking: Reported on 06/11/2019) 40 tablet 0  . traMADol (ULTRAM) 50 MG tablet Take 1 tablet (50 mg total) by mouth every 6 (six) hours as needed for moderate pain or severe pain. 30 tablet 0   No current facility-administered medications on file prior to visit.    There are no Patient Instructions on file for this visit. No follow-ups on file.   Georgiana Spinner, NP

## 2019-08-05 ENCOUNTER — Emergency Department: Payer: Medicare Other

## 2019-08-05 ENCOUNTER — Encounter: Payer: Self-pay | Admitting: Psychiatry

## 2019-08-05 ENCOUNTER — Other Ambulatory Visit: Payer: Self-pay

## 2019-08-05 ENCOUNTER — Observation Stay
Admission: EM | Admit: 2019-08-05 | Discharge: 2019-08-06 | Disposition: A | Discharge status: Home / Self Care | Payer: Medicare Other | Attending: Internal Medicine | Admitting: Internal Medicine

## 2019-08-05 DIAGNOSIS — Z7982 Long term (current) use of aspirin: Secondary | ICD-10-CM | POA: Insufficient documentation

## 2019-08-05 DIAGNOSIS — Z833 Family history of diabetes mellitus: Secondary | ICD-10-CM | POA: Insufficient documentation

## 2019-08-05 DIAGNOSIS — S32020A Wedge compression fracture of second lumbar vertebra, initial encounter for closed fracture: Secondary | ICD-10-CM | POA: Diagnosis not present

## 2019-08-05 DIAGNOSIS — Z791 Long term (current) use of non-steroidal anti-inflammatories (NSAID): Secondary | ICD-10-CM | POA: Insufficient documentation

## 2019-08-05 DIAGNOSIS — M549 Dorsalgia, unspecified: Secondary | ICD-10-CM | POA: Diagnosis present

## 2019-08-05 DIAGNOSIS — Z8249 Family history of ischemic heart disease and other diseases of the circulatory system: Secondary | ICD-10-CM | POA: Insufficient documentation

## 2019-08-05 DIAGNOSIS — R251 Tremor, unspecified: Secondary | ICD-10-CM | POA: Diagnosis not present

## 2019-08-05 DIAGNOSIS — E039 Hypothyroidism, unspecified: Secondary | ICD-10-CM | POA: Insufficient documentation

## 2019-08-05 DIAGNOSIS — Z9889 Other specified postprocedural states: Secondary | ICD-10-CM | POA: Insufficient documentation

## 2019-08-05 DIAGNOSIS — I4891 Unspecified atrial fibrillation: Secondary | ICD-10-CM | POA: Insufficient documentation

## 2019-08-05 DIAGNOSIS — E1142 Type 2 diabetes mellitus with diabetic polyneuropathy: Secondary | ICD-10-CM | POA: Diagnosis not present

## 2019-08-05 DIAGNOSIS — F0390 Unspecified dementia without behavioral disturbance: Secondary | ICD-10-CM | POA: Diagnosis not present

## 2019-08-05 DIAGNOSIS — I69354 Hemiplegia and hemiparesis following cerebral infarction affecting left non-dominant side: Secondary | ICD-10-CM | POA: Diagnosis not present

## 2019-08-05 DIAGNOSIS — Z87442 Personal history of urinary calculi: Secondary | ICD-10-CM | POA: Insufficient documentation

## 2019-08-05 DIAGNOSIS — K219 Gastro-esophageal reflux disease without esophagitis: Secondary | ICD-10-CM | POA: Insufficient documentation

## 2019-08-05 DIAGNOSIS — M4856XA Collapsed vertebra, not elsewhere classified, lumbar region, initial encounter for fracture: Secondary | ICD-10-CM | POA: Diagnosis not present

## 2019-08-05 DIAGNOSIS — K5730 Diverticulosis of large intestine without perforation or abscess without bleeding: Secondary | ICD-10-CM | POA: Insufficient documentation

## 2019-08-05 DIAGNOSIS — K449 Diaphragmatic hernia without obstruction or gangrene: Secondary | ICD-10-CM | POA: Insufficient documentation

## 2019-08-05 DIAGNOSIS — I10 Essential (primary) hypertension: Secondary | ICD-10-CM | POA: Diagnosis not present

## 2019-08-05 DIAGNOSIS — Z20822 Contact with and (suspected) exposure to covid-19: Secondary | ICD-10-CM | POA: Insufficient documentation

## 2019-08-05 DIAGNOSIS — I2510 Atherosclerotic heart disease of native coronary artery without angina pectoris: Secondary | ICD-10-CM | POA: Insufficient documentation

## 2019-08-05 DIAGNOSIS — J449 Chronic obstructive pulmonary disease, unspecified: Secondary | ICD-10-CM | POA: Insufficient documentation

## 2019-08-05 DIAGNOSIS — M545 Low back pain: Secondary | ICD-10-CM | POA: Insufficient documentation

## 2019-08-05 DIAGNOSIS — Z79899 Other long term (current) drug therapy: Secondary | ICD-10-CM | POA: Insufficient documentation

## 2019-08-05 DIAGNOSIS — F419 Anxiety disorder, unspecified: Secondary | ICD-10-CM | POA: Diagnosis not present

## 2019-08-05 DIAGNOSIS — G4733 Obstructive sleep apnea (adult) (pediatric): Secondary | ICD-10-CM | POA: Insufficient documentation

## 2019-08-05 DIAGNOSIS — R000 Tachycardia, unspecified: Secondary | ICD-10-CM

## 2019-08-05 DIAGNOSIS — R0600 Dyspnea, unspecified: Secondary | ICD-10-CM | POA: Diagnosis not present

## 2019-08-05 DIAGNOSIS — Z801 Family history of malignant neoplasm of trachea, bronchus and lung: Secondary | ICD-10-CM | POA: Insufficient documentation

## 2019-08-05 DIAGNOSIS — E1143 Type 2 diabetes mellitus with diabetic autonomic (poly)neuropathy: Secondary | ICD-10-CM | POA: Insufficient documentation

## 2019-08-05 DIAGNOSIS — Z832 Family history of diseases of the blood and blood-forming organs and certain disorders involving the immune mechanism: Secondary | ICD-10-CM | POA: Insufficient documentation

## 2019-08-05 DIAGNOSIS — Z950 Presence of cardiac pacemaker: Secondary | ICD-10-CM | POA: Diagnosis not present

## 2019-08-05 DIAGNOSIS — N210 Calculus in bladder: Secondary | ICD-10-CM | POA: Diagnosis not present

## 2019-08-05 DIAGNOSIS — K3184 Gastroparesis: Secondary | ICD-10-CM | POA: Insufficient documentation

## 2019-08-05 DIAGNOSIS — Z888 Allergy status to other drugs, medicaments and biological substances status: Secondary | ICD-10-CM | POA: Insufficient documentation

## 2019-08-05 DIAGNOSIS — M47816 Spondylosis without myelopathy or radiculopathy, lumbar region: Secondary | ICD-10-CM | POA: Insufficient documentation

## 2019-08-05 DIAGNOSIS — E785 Hyperlipidemia, unspecified: Secondary | ICD-10-CM | POA: Diagnosis not present

## 2019-08-05 DIAGNOSIS — I774 Celiac artery compression syndrome: Secondary | ICD-10-CM | POA: Insufficient documentation

## 2019-08-05 DIAGNOSIS — N400 Enlarged prostate without lower urinary tract symptoms: Secondary | ICD-10-CM | POA: Insufficient documentation

## 2019-08-05 DIAGNOSIS — Z9861 Coronary angioplasty status: Secondary | ICD-10-CM | POA: Insufficient documentation

## 2019-08-05 DIAGNOSIS — Z87891 Personal history of nicotine dependence: Secondary | ICD-10-CM | POA: Insufficient documentation

## 2019-08-05 DIAGNOSIS — Z7901 Long term (current) use of anticoagulants: Secondary | ICD-10-CM | POA: Insufficient documentation

## 2019-08-05 DIAGNOSIS — M5459 Other low back pain: Secondary | ICD-10-CM | POA: Diagnosis present

## 2019-08-05 DIAGNOSIS — Z823 Family history of stroke: Secondary | ICD-10-CM | POA: Insufficient documentation

## 2019-08-05 DIAGNOSIS — M059 Rheumatoid arthritis with rheumatoid factor, unspecified: Secondary | ICD-10-CM | POA: Insufficient documentation

## 2019-08-05 DIAGNOSIS — Z794 Long term (current) use of insulin: Secondary | ICD-10-CM | POA: Insufficient documentation

## 2019-08-05 DIAGNOSIS — I700 Atherosclerosis of aorta: Secondary | ICD-10-CM | POA: Insufficient documentation

## 2019-08-05 DIAGNOSIS — R471 Dysarthria and anarthria: Secondary | ICD-10-CM | POA: Insufficient documentation

## 2019-08-05 DIAGNOSIS — Z885 Allergy status to narcotic agent status: Secondary | ICD-10-CM | POA: Insufficient documentation

## 2019-08-05 LAB — LIPASE, BLOOD: Lipase: 21 U/L (ref 11–51)

## 2019-08-05 LAB — CBC WITH DIFFERENTIAL/PLATELET
Abs Immature Granulocytes: 0.02 10*3/uL (ref 0.00–0.07)
Basophils Absolute: 0.1 10*3/uL (ref 0.0–0.1)
Basophils Relative: 1 %
Eosinophils Absolute: 0.3 10*3/uL (ref 0.0–0.5)
Eosinophils Relative: 4 %
HCT: 40.7 % (ref 39.0–52.0)
Hemoglobin: 13.6 g/dL (ref 13.0–17.0)
Immature Granulocytes: 0 %
Lymphocytes Relative: 28 %
Lymphs Abs: 2.4 10*3/uL (ref 0.7–4.0)
MCH: 32.2 pg (ref 26.0–34.0)
MCHC: 33.4 g/dL (ref 30.0–36.0)
MCV: 96.2 fL (ref 80.0–100.0)
Monocytes Absolute: 0.9 10*3/uL (ref 0.1–1.0)
Monocytes Relative: 10 %
Neutro Abs: 4.9 10*3/uL (ref 1.7–7.7)
Neutrophils Relative %: 57 %
Platelets: 258 10*3/uL (ref 150–400)
RBC: 4.23 MIL/uL (ref 4.22–5.81)
RDW: 14.1 % (ref 11.5–15.5)
WBC: 8.5 10*3/uL (ref 4.0–10.5)
nRBC: 0 % (ref 0.0–0.2)

## 2019-08-05 LAB — COMPREHENSIVE METABOLIC PANEL
ALT: 27 U/L (ref 0–44)
AST: 30 U/L (ref 15–41)
Albumin: 3.5 g/dL (ref 3.5–5.0)
Alkaline Phosphatase: 90 U/L (ref 38–126)
Anion gap: 8 (ref 5–15)
BUN: 12 mg/dL (ref 8–23)
CO2: 27 mmol/L (ref 22–32)
Calcium: 9 mg/dL (ref 8.9–10.3)
Chloride: 107 mmol/L (ref 98–111)
Creatinine, Ser: 0.75 mg/dL (ref 0.61–1.24)
GFR calc Af Amer: >60 mL/min (ref >60)
GFR calc non Af Amer: >60 mL/min (ref >60)
Glucose, Bld: 134 mg/dL — ABNORMAL HIGH (ref 70–99)
Potassium: 4.5 mmol/L (ref 3.5–5.1)
Sodium: 142 mmol/L (ref 135–145)
Total Bilirubin: 0.8 mg/dL (ref 0.3–1.2)
Total Protein: 6.8 g/dL (ref 6.5–8.1)

## 2019-08-05 LAB — URINALYSIS, COMPLETE (UACMP) WITH MICROSCOPIC
Bilirubin Urine: NEGATIVE
Glucose, UA: NEGATIVE mg/dL
Hgb urine dipstick: NEGATIVE
Ketones, ur: NEGATIVE mg/dL
Leukocytes,Ua: NEGATIVE
Nitrite: NEGATIVE
Protein, ur: NEGATIVE mg/dL
Specific Gravity, Urine: 1.006 (ref 1.005–1.030)
Squamous Epithelial / HPF: NONE SEEN (ref 0–5)
pH: 6 (ref 5.0–8.0)

## 2019-08-05 LAB — SARS CORONAVIRUS 2 BY RT PCR (HOSPITAL ORDER, PERFORMED IN ~~LOC~~ HOSPITAL LAB): SARS Coronavirus 2: NEGATIVE

## 2019-08-05 LAB — LACTIC ACID, PLASMA
Lactic Acid, Venous: 2.1 mmol/L (ref 0.5–1.9)
Lactic Acid, Venous: 1.5 mmol/L (ref 0.5–1.9)

## 2019-08-05 IMAGING — DX DG CHEST 1V PORT
1 series · 1 of 1 positions shown · non-contrast
Comparison: [DATE]

CLINICAL DATA: Tachycardia

EXAM:
PORTABLE CHEST 1 VIEW

[chest ap]
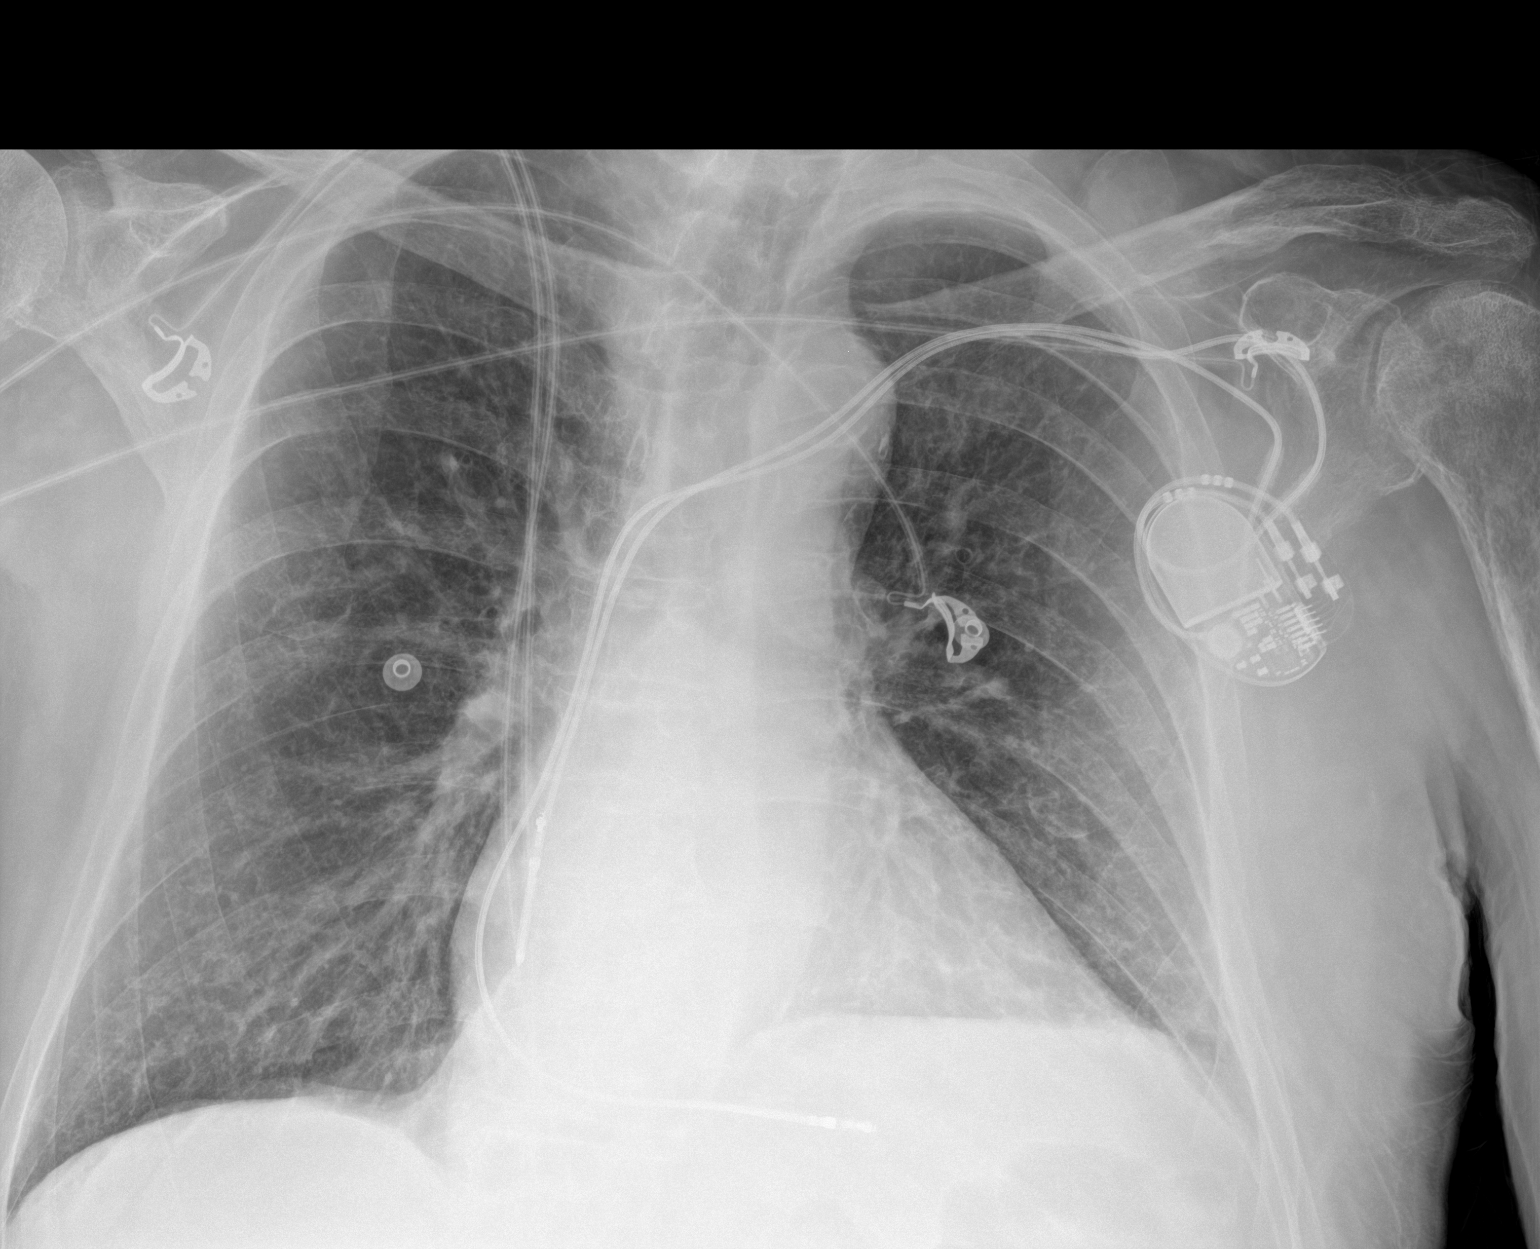

[1 of 1 positions shown; findings below may reference images not displayed]

FINDINGS: Left-sided pacing device. No focal opacity or pleural effusion. Mild
elevation left diaphragm as before. Stable cardiomediastinal
silhouette with aortic atherosclerosis. No pneumothorax.
IMPRESSION: No active disease.

## 2019-08-05 MED ORDER — INSULIN DETEMIR 100 UNIT/ML ~~LOC~~ SOLN
20.0000 [IU] | Freq: Every day | SUBCUTANEOUS | Status: DC
  Administered 2019-08-06: 20 [IU] via SUBCUTANEOUS
  Filled 2019-08-05 (×3): qty 0.2

## 2019-08-05 MED ORDER — SENNOSIDES-DOCUSATE SODIUM 8.6-50 MG PO TABS
2.0000 | ORAL_TABLET | Freq: Every day | ORAL | Status: DC
  Administered 2019-08-06: 2 via ORAL
  Filled 2019-08-05: qty 2

## 2019-08-05 MED ORDER — METOPROLOL SUCCINATE ER 50 MG PO TB24
25.0000 mg | ORAL_TABLET | Freq: Every day | ORAL | Status: DC
  Administered 2019-08-05: 25 mg via ORAL
  Filled 2019-08-05: qty 1

## 2019-08-05 MED ORDER — ATORVASTATIN CALCIUM 20 MG PO TABS
40.0000 mg | ORAL_TABLET | Freq: Every day | ORAL | Status: DC
  Administered 2019-08-06: 40 mg via ORAL
  Filled 2019-08-05: qty 2

## 2019-08-05 MED ORDER — MIRTAZAPINE 15 MG PO TABS
30.0000 mg | ORAL_TABLET | Freq: Every day | ORAL | Status: DC
  Administered 2019-08-06: 30 mg via ORAL
  Filled 2019-08-05: qty 2

## 2019-08-05 MED ORDER — MAGNESIUM HYDROXIDE 400 MG/5ML PO SUSP: 30.0000 mL | Freq: Every day | ORAL | Status: DC | PRN

## 2019-08-05 MED ORDER — POLYETHYLENE GLYCOL 3350 17 G PO PACK: 17.0000 g | PACK | Freq: Every day | ORAL | Status: DC | PRN

## 2019-08-05 MED ORDER — TRAMADOL HCL 50 MG PO TABS
50.0000 mg | ORAL_TABLET | Freq: Four times a day (QID) | ORAL | Status: DC | PRN
  Administered 2019-08-06: 50 mg via ORAL
  Filled 2019-08-05: qty 1

## 2019-08-05 MED ORDER — FAMOTIDINE 20 MG PO TABS
40.0000 mg | ORAL_TABLET | Freq: Two times a day (BID) | ORAL | Status: DC
  Administered 2019-08-06 (×2): 40 mg via ORAL
  Filled 2019-08-05 (×2): qty 2

## 2019-08-05 MED ORDER — ACETAMINOPHEN 325 MG PO TABS
650.0000 mg | ORAL_TABLET | Freq: Four times a day (QID) | ORAL | Status: DC | PRN
  Administered 2019-08-06: 650 mg via ORAL
  Filled 2019-08-05: qty 2

## 2019-08-05 MED ORDER — TRAZODONE HCL 50 MG PO TABS: 25.0000 mg | ORAL_TABLET | Freq: Every evening | ORAL | Status: DC | PRN

## 2019-08-05 MED ORDER — FOLIC ACID 1 MG PO TABS
1.0000 mg | ORAL_TABLET | Freq: Every day | ORAL | Status: DC
  Administered 2019-08-06 (×2): 1 mg via ORAL
  Filled 2019-08-05 (×2): qty 1

## 2019-08-05 MED ORDER — KETOROLAC TROMETHAMINE 15 MG/ML IJ SOLN
15.0000 mg | Freq: Four times a day (QID) | INTRAMUSCULAR | Status: DC | PRN
  Administered 2019-08-05: 15 mg via INTRAVENOUS
  Filled 2019-08-05 (×3): qty 1

## 2019-08-05 MED ORDER — ALBUTEROL SULFATE (2.5 MG/3ML) 0.083% IN NEBU: 2.5000 mg | INHALATION_SOLUTION | Freq: Four times a day (QID) | RESPIRATORY_TRACT | Status: DC | PRN

## 2019-08-05 MED ORDER — METOPROLOL TARTRATE 5 MG/5ML IV SOLN
5.0000 mg | Freq: Once | INTRAVENOUS | Status: AC
  Administered 2019-08-05: 5 mg via INTRAVENOUS
  Filled 2019-08-05: qty 5

## 2019-08-05 MED ORDER — ALPRAZOLAM 0.25 MG PO TABS: 0.2500 mg | ORAL_TABLET | Freq: Three times a day (TID) | ORAL | Status: DC | PRN

## 2019-08-05 MED ORDER — BISMUTH SUBSALICYLATE 262 MG/15ML PO SUSP
30.0000 mL | Freq: Four times a day (QID) | ORAL | Status: DC | PRN
  Filled 2019-08-05: qty 118

## 2019-08-05 MED ORDER — MELATONIN 5 MG PO TABS
5.0000 mg | ORAL_TABLET | Freq: Every evening | ORAL | Status: DC | PRN
  Filled 2019-08-05: qty 1

## 2019-08-05 MED ORDER — LEVOTHYROXINE SODIUM 50 MCG PO TABS
125.0000 ug | ORAL_TABLET | Freq: Every day | ORAL | Status: DC
  Administered 2019-08-06: 125 ug via ORAL
  Filled 2019-08-05: qty 5

## 2019-08-05 MED ORDER — ACETAMINOPHEN 650 MG RE SUPP: 650.0000 mg | Freq: Four times a day (QID) | RECTAL | Status: DC | PRN

## 2019-08-05 MED ORDER — FINASTERIDE 5 MG PO TABS
5.0000 mg | ORAL_TABLET | Freq: Every day | ORAL | Status: DC
  Administered 2019-08-06: 5 mg via ORAL
  Filled 2019-08-05 (×2): qty 1

## 2019-08-05 MED ORDER — ACETAMINOPHEN 500 MG PO TABS: 500.0000 mg | ORAL_TABLET | Freq: Four times a day (QID) | ORAL | Status: DC | PRN

## 2019-08-05 MED ORDER — GABAPENTIN 300 MG PO CAPS
300.0000 mg | ORAL_CAPSULE | Freq: Every day | ORAL | Status: DC
  Administered 2019-08-06: 300 mg via ORAL
  Filled 2019-08-05: qty 1

## 2019-08-05 MED ORDER — BIOTENE DRY MOUTH MOISTURIZING MT SOLN: 1.0000 | OROMUCOSAL | Status: DC | PRN

## 2019-08-05 MED ORDER — ONDANSETRON HCL 4 MG/2ML IJ SOLN
4.0000 mg | Freq: Four times a day (QID) | INTRAMUSCULAR | Status: DC | PRN
  Administered 2019-08-05: 4 mg via INTRAVENOUS
  Filled 2019-08-05: qty 2

## 2019-08-05 MED ORDER — TRAMADOL HCL 50 MG PO TABS
25.0000 mg | ORAL_TABLET | Freq: Once | ORAL | Status: AC
  Administered 2019-08-05: 25 mg via ORAL
  Filled 2019-08-05: qty 1

## 2019-08-05 MED ORDER — IOHEXOL 350 MG/ML SOLN
100.0000 mL | Freq: Once | INTRAVENOUS | Status: AC | PRN
  Administered 2019-08-05: 100 mL via INTRAVENOUS

## 2019-08-05 MED ORDER — ALBUTEROL SULFATE HFA 108 (90 BASE) MCG/ACT IN AERS: 2.0000 | INHALATION_SPRAY | RESPIRATORY_TRACT | Status: DC | PRN

## 2019-08-05 MED ORDER — BISACODYL 5 MG PO TBEC: 5.0000 mg | DELAYED_RELEASE_TABLET | Freq: Every day | ORAL | Status: DC | PRN

## 2019-08-05 MED ORDER — METOPROLOL SUCCINATE ER 50 MG PO TB24
25.0000 mg | ORAL_TABLET | Freq: Two times a day (BID) | ORAL | Status: DC
  Administered 2019-08-06: 25 mg via ORAL
  Filled 2019-08-05: qty 1

## 2019-08-05 MED ORDER — DULOXETINE HCL 60 MG PO CPEP
60.0000 mg | ORAL_CAPSULE | Freq: Every day | ORAL | Status: DC
  Administered 2019-08-06 (×2): 60 mg via ORAL
  Filled 2019-08-05 (×2): qty 1

## 2019-08-05 MED ORDER — SODIUM CHLORIDE 0.9 % IV SOLN: INTRAVENOUS | Status: DC

## 2019-08-05 MED ORDER — SODIUM CHLORIDE 0.9 % IV BOLUS
500.0000 mL | Freq: Once | INTRAVENOUS | Status: AC
  Administered 2019-08-05: 500 mL via INTRAVENOUS

## 2019-08-05 MED ORDER — DOCUSATE SODIUM 100 MG PO CAPS
100.0000 mg | ORAL_CAPSULE | Freq: Every day | ORAL | Status: DC
  Administered 2019-08-06 (×2): 100 mg via ORAL
  Filled 2019-08-05 (×2): qty 1

## 2019-08-05 MED ORDER — ONDANSETRON HCL 4 MG PO TABS: 4.0000 mg | ORAL_TABLET | Freq: Four times a day (QID) | ORAL | Status: DC | PRN

## 2019-08-05 MED ORDER — METHOTREXATE SODIUM CHEMO INJECTION (PF) 50 MG/2ML: 15.0000 mg | INTRAMUSCULAR | Status: DC

## 2019-08-05 NOTE — H&P (Signed)
[place] at [facility]   PATIENT NAME: Oscar Brock    MR#:  [id number]  DATE OF BIRTH:  1940/07/07  DATE OF ADMISSION:  08/05/2019  PRIMARY CARE PHYSICIAN: Mickey Farber, MD   REQUESTING/REFERRING PHYSICIAN: Paschal Dopp, MD CHIEF COMPLAINT:   Chief Complaint  Patient presents with  . Tachycardia    HISTORY OF PRESENT ILLNESS:  Oscar Brock  is a 79 y.o. Caucasian male with a known history of with a known history of asthma, COPD, hypertension, dyslipidemia, atrial fibrillation, type 2 diabetes mellitus, CVA with residual left-sided hemiplegia and obstructive sleep apnea, presented to the emergency room with onset of tachycardia while checking his vital signs with concern about possible infection that can sometimes be associated with his tachycardia.  He has been having generalized pain related to his rheumatoid arthritis as well as periumbilical abdominal discomfort that is still going on after his celiac artery stent that was placed recently last month.  Is also been having intractable low back pain which started about a few weeks ago and get significantly worse.  The patient is bed to wheelchair.  He did not have any reported falls or trauma per his wife.  There were no new paresthesias in either of his lower extremities.  His left leg is weak from his previous stroke and he gets spasms in his right leg occasionally with mild restlessness.  No fever or chills.  He denies any nausea or vomiting.  Is having recent chronic abdominal pain for which she had a celiac artery stent last month.  No chest pain or dyspnea or cough or wheezing.  Upon presentation to the emergency room, heart rate was 1 10-1 15 with otherwise normal vital signs.  Labs revealed lactic acid of 2.1 and later 1.5 with unremarkable CBC and CMP otherwise.  UA was negative.  Chest x-ray showed no acute cardiopulmonary disease and showed this pacemaker.  COVID-19 PCR came back negative.EKG showed ventricular paced  rhythm with rate of 114.  Abdominal pelvic CTA scan revealed patent proximal celiac artery stent without evidence for aneurysm, dissection vasculitis or significant stenosis, moderate sized hiatal hernia and sigmoid diverticulosis without diverticulitis and numerous subcentimeter bladder calculi and acute L2 vertebral compression fracture deformity.  The patient was given Toprol-XL and 5 mg of IV Lopressor, 500 mill of IV normal saline  of p.o. tramadol.  He will be admitted to the medical monitored bed for further evaluation and management.   PAST MEDICAL HISTORY:   Past Medical History:  Diagnosis Date  . Anxiety   . Arthritis   . Asthma   . Atrial fibrillation (HCC)   . 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 COPD (chronic obstructive pulmonary disease) (HCC)   . Dementia (HCC)   . Diabetes mellitus without complication (HCC)   . Dyspnea   . GERD (gastroesophageal reflux disease)   . Headache    every morning on waking  . HTN (hypertension) 04/11/2017  . Hyperlipidemia 02/18/2009  . Hypertension   . Kidney stones   . 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 Paralysis (HCC)   . Presence of permanent cardiac pacemaker   . Sleep apnea   . Stroke (HCC)   . Tremor 07/12/2016    PAST SURGICAL HISTORY:   Past Surgical History:  Procedure Laterality Date  . APPENDECTOMY    . CORONARY ANGIOPLASTY    . ESOPHAGOGASTRODUODENOSCOPY    . ESOPHAGOGASTRODUODENOSCOPY (EGD) WITH PROPOFOL N/A 06/10/2019   Procedure: ESOPHAGOGASTRODUODENOSCOPY (EGD) WITH  PROPOFOL;  Surgeon: [name], MD;  Location: [facility] ENDOSCOPY;  Service: Gastroenterology;  Laterality: N/A;  . HERNIA REPAIR    . PACEMAKER INSERTION    . PACEMAKER INSERTION    . PEG PLACEMENT N/A 09/26/2017   Procedure: PERCUTANEOUS ENDOSCOPIC GASTROSTOMY (PEG) PLACEMENT;  Surgeon: Midge Minium, MD;  Location: [facility] ENDOSCOPY;  Service: Endoscopy;  Laterality: N/A;  . PEG TUBE PLACEMENT    . VISCERAL ANGIOGRAPHY  N/A 06/11/2019   Procedure: VISCERAL ANGIOGRAPHY;  Surgeon: Renford Dills, MD;  Location: [facility] INVASIVE CV LAB;  Service: Cardiovascular;  Laterality: N/A;  . VISCERAL ANGIOGRAPHY N/A 06/25/2019   Procedure: VISCERAL ANGIOGRAPHY;  Surgeon: Renford Dills, MD;  Location: [facility] INVASIVE CV LAB;  Service: Cardiovascular;  Laterality: N/A;    SOCIAL HISTORY:   Social History   Tobacco Use  . Smoking status: Former Smoker    Quit date: 1989    Years since quitting: 32.4  . Smokeless tobacco: Never Used  Substance Use Topics  . Alcohol use: No    FAMILY HISTORY:   Family History  Problem Relation Age of Onset  . Heart failure Mother   . Clotting disorder Mother   . Heart failure Father   . Hypertension Father   . Diabetes Father   . Stroke Brother   . Lung cancer Sister     DRUG ALLERGIES:   Allergies  Allergen Reactions  . Effexor Xr [Venlafaxine Hcl Er] Other (See Comments)    unknown  . Lisinopril Other (See Comments)    unknown  . Other Other (See Comments)    Jitters/nervous  . Oxycodone-Acetaminophen Other (See Comments)    Jitters wild  . Budesonide-Formoterol Fumarate     Other reaction(s): Other (See Comments) hoarseness  . Fluticasone     Other reaction(s): Other (See Comments) hoarseness  . Propoxyphene Other (See Comments)    Jittery/nervous Jitters/nervous     REVIEW OF SYSTEMS:   ROS As per history of present illness. All pertinent systems were reviewed above. Constitutional,  HEENT, cardiovascular, respiratory, GI, GU, musculoskeletal, neuro, psychiatric, endocrine,  integumentary and hematologic systems were reviewed and are otherwise  negative/unremarkable except for positive findings mentioned above in the HPI.   MEDICATIONS AT HOME:   Prior to Admission medications   Medication Sig Start Date End Date Taking? Authorizing Provider  acetaminophen (TYLENOL) 500 MG tablet Take 500-1,000 mg by mouth 4 (four) times daily as needed for  mild pain or fever.    Yes [provider]  albuterol (PROVENTIL) (2.5 MG/3ML) 0.083% nebulizer solution Take 2.5 mg by nebulization every 6 (six) hours as needed for wheezing or shortness of breath.    Yes [provider]  ALPRAZolam (XANAX) 0.25 MG tablet Take 0.25 mg by mouth 3 (three) times daily as needed for anxiety.    Yes [provider]  apixaban (ELIQUIS) 5 MG TABS tablet Take 5 mg by mouth 2 (two) times daily.   Yes [provider]  aspirin EC 81 MG tablet Take 81 mg by mouth daily.    Yes [provider]  atorvastatin (LIPITOR) 40 MG tablet Take 40 mg by mouth at bedtime.    Yes [provider]  docusate sodium (COLACE) 100 MG capsule Take 100 mg by mouth daily.   Yes [provider]  DULoxetine (CYMBALTA) 60 MG capsule Take 60 mg by mouth daily.    Yes [provider]  famotidine (PEPCID) 40 MG tablet Take 1 tablet (40 mg total) by  mouth 2 (two) times daily. 05/20/19  Yes [name], MD  finasteride (PROSCAR) 5 MG tablet Take 5 mg by mouth at bedtime.    Yes [provider]  folic acid (FOLVITE) 1 MG tablet Take 1 mg by mouth daily.   Yes [provider]  gabapentin (NEURONTIN) 300 MG capsule Take 300 mg by mouth at bedtime.    Yes [provider]  HUMALOG KWIKPEN 100 UNIT/ML KiwkPen Inject 1-3 Units into the skin every evening. Sliding scale   Yes [provider]  Insulin Detemir (LEVEMIR FLEXTOUCH) 100 UNIT/ML Pen Inject 20 Units into the skin daily.    Yes [provider]  levothyroxine (SYNTHROID) 125 MCG tablet Take 125 mcg by mouth daily.    Yes [provider]  melatonin 5 MG TABS Take by mouth.   Yes [provider]  Methotrexate Sodium (METHOTREXATE, PF,) 200 MG/8ML injection Inject 50 mg/m2 into the skin every Sunday. 0.6 ml   Yes [provider]  metoprolol succinate (TOPROL-XL) 25 MG 24 hr tablet Take 25 mg by mouth 2 (two) times  daily.   Yes [provider]  mirtazapine (REMERON) 15 MG tablet Take 30 mg by mouth at bedtime.    Yes [provider]  traMADol (ULTRAM) 50 MG tablet Take 1 tablet (50 mg total) by mouth every 6 (six) hours as needed for moderate pain or severe pain. 06/26/19  Yes Stegmayer, Ranae Plumber, PA-C  albuterol (VENTOLIN HFA) 108 (90 Base) MCG/ACT inhaler Inhale 2 puffs into the lungs every 4 (four) hours as needed for wheezing or shortness of breath.    [provider]  apixaban (ELIQUIS) 5 MG TABS tablet Take by mouth. 07/05/19   [provider]  Artificial Saliva (BIOTENE DRY MOUTH MOISTURIZING) SOLN Take 1 spray by mouth every 2 (two) hours as needed (dry mouth).     [provider]  bisacodyl (DULCOLAX) 5 MG EC tablet Take 5 mg by mouth daily as needed for mild constipation.     [provider]  bismuth subsalicylate (KAOPECTATE) 262 MG/15ML suspension Take 30 mLs by mouth every 6 (six) hours as needed for indigestion. kaopectate    [provider]  collagenase (SANTYL) ointment Apply topically. 07/26/19   [provider]  diclofenac Sodium (VOLTAREN) 1 % GEL Apply 2 g topically daily as needed (Shoulder pain).     [provider]  magnesium hydroxide (MILK OF MAGNESIA) 400 MG/5ML suspension Take 30 mLs by mouth daily as needed for mild constipation.    [provider]  metoCLOPramide (REGLAN) 10 MG tablet Take 1 tablet (10 mg total) by mouth 3 (three) times daily before meals. Patient not taking: Reported on 07/10/2019 06/20/19   Schnier, Latina Craver, MD  polyethylene glycol (MIRALAX / GLYCOLAX) 17 g packet Take 17 g by mouth daily as needed for mild constipation.    [provider]  senna-docusate (SENOKOT-S) 8.6-50 MG tablet Take 2 tablets by mouth at bedtime.     [provider]  silver sulfADIAZINE (SILVADENE) 1 % cream Apply topically. 07/26/19 07/25/20  [provider]  sucralfate  (CARAFATE) 1 g tablet Take 1 tablet (1 g total) by mouth 4 (four) times daily for 10 days. Patient not taking: Reported on 06/11/2019 05/15/19 05/25/19  Phineas Semen, MD  traMADol (ULTRAM) 50 MG tablet Take 50 mg by mouth in the morning, at noon, and at bedtime.     [provider]  trolamine salicylate (BLUE-EMU HEMP) 10 %  cream Apply 1 application topically daily as needed for muscle pain.    [provider]      VITAL SIGNS:  Blood pressure (!) 144/101, pulse (!) 115, temperature 99.1 F (37.3 C), temperature source Oral, resp. rate 14, height 6\' 3"  (1.905 m), weight 94.3 kg, SpO2 100 %.  PHYSICAL EXAMINATION:  Physical Exam  GENERAL:  79 y.o.-year-old Caucasian male patient lying in the bed with no acute distress.  EYES: Pupils equal, round, reactive to light and accommodation. No scleral icterus. Extraocular muscles intact.  HEENT: Head atraumatic, normocephalic. Oropharynx and nasopharynx clear.  NECK:  Supple, no jugular venous distention. No thyroid enlargement, no tenderness.  LUNGS: Normal breath sounds bilaterally, no wheezing, rales,rhonchi or crepitation. No use of accessory muscles of respiration.  CARDIOVASCULAR: Regular rate and rhythm, S1, S2 normal. No murmurs, rubs, or gallops.  ABDOMEN: Soft, nondistended, nontender. Bowel sounds present. No organomegaly or mass.  EXTREMITIES: No pedal edema, cyanosis, or clubbing. Musculoskeletal: Low back tenderness at the L2 level. NEUROLOGIC: He has left-sided hemiplegia. PSYCHIATRIC: The patient is alert and oriented x 3.  Normal affect and good eye contact. SKIN: He has a dressing to the lower back over small wounds due to heating pad burn.  No obvious rash, lesion, or ulcer otherwise.   LABORATORY PANEL:   CBC Recent Labs  Lab 08/05/19 1620  WBC 8.5  HGB 13.6  HCT 40.7  PLT 258    ------------------------------------------------------------------------------------------------------------------  Chemistries  Recent Labs  Lab 08/05/19 1620  NA 142  K 4.5  CL 107  CO2 27  GLUCOSE 134*  BUN 12  CREATININE 0.75  CALCIUM 9.0  AST 30  ALT 27  ALKPHOS 90  BILITOT 0.8   ------------------------------------------------------------------------------------------------------------------  Cardiac Enzymes No results for input(s): TROPONINI in the last 168 hours. ------------------------------------------------------------------------------------------------------------------  RADIOLOGY:  DG Chest Port 1 View  Result Date: 08/05/2019 CLINICAL DATA:  Tachycardia EXAM: PORTABLE CHEST 1 VIEW COMPARISON:  05/15/2019 FINDINGS: Left-sided pacing device. No focal opacity or pleural effusion. Mild elevation left diaphragm as before. Stable cardiomediastinal silhouette with aortic atherosclerosis. No pneumothorax. IMPRESSION: No active disease. Electronically Signed   By: Donavan Foil M.D.   On: 08/05/2019 16:41   CT Angio Abd/Pel w/ and/or w/o  Result Date: 08/05/2019 CLINICAL DATA:  Elevated heart rate. EXAM: CTA ABDOMEN AND PELVIS WITHOUT AND WITH CONTRAST TECHNIQUE: Multidetector CT imaging of the abdomen and pelvis was performed using the standard protocol during bolus administration of intravenous contrast. Multiplanar reconstructed images and MIPs were obtained and reviewed to evaluate the vascular anatomy. CONTRAST:  164mL OMNIPAQUE IOHEXOL 350 MG/ML SOLN COMPARISON:  May 19, 2019 FINDINGS: VASCULAR Aorta: Mild to moderate severity calcification of a normal caliber aorta without aneurysm, dissection, vasculitis or significant stenosis. Celiac: Patent proximal celiac artery stent without evidence of aneurysm, dissection, vasculitis or significant stenosis. SMA: Patent without evidence of aneurysm, dissection, vasculitis or significant stenosis. Renals: Both renal arteries  are patent without evidence of aneurysm, dissection, vasculitis, fibromuscular dysplasia or significant stenosis. IMA: Patent without evidence of aneurysm, dissection, vasculitis or significant stenosis. Inflow: Mild to moderate severity calcification of the bilateral common iliac arteries which are patent without evidence of aneurysm, dissection, vasculitis or significant stenosis. Proximal Outflow: There is moderate severity calcification of the bilateral common femoral arteries. The visualized portions of the superficial and profunda femoral arteries are patent without evidence of aneurysm, dissection, vasculitis or significant stenosis. Veins: No obvious venous abnormality within the limitations of this arterial phase study. Review of  the MIP images confirms the above findings. NON-VASCULAR Lower chest: No acute abnormality. Hepatobiliary: No focal liver abnormality is seen. No gallstones, gallbladder wall thickening, or biliary dilatation. Pancreas: Unremarkable. No pancreatic ductal dilatation or surrounding inflammatory changes. Spleen: Normal in size without focal abnormality. Adrenals/Urinary Tract: Adrenal glands are unremarkable. Kidneys are normal in size, without renal calculi or hydronephrosis. A 2.7 cm simple cyst is seen within the anterior aspect of the mid left kidney. Numerous subcentimeter calcifications are seen within the dependent portion of the urinary bladder. Stomach/Bowel: There is a moderate-sized hiatal hernia. The appendix is not identified. No evidence of bowel wall thickening, distention, or inflammatory changes. Noninflamed diverticula are seen throughout the sigmoid colon. Lymphatic: No abnormal abdominal or pelvic lymph nodes are identified. Reproductive: The prostate gland is mildly enlarged. Other: No abdominal wall hernia or abnormality. No abdominopelvic ascites. Musculoskeletal: An acute compression fracture deformity is seen involving the L2 vertebral body. This represents a  new finding when compared to the prior study. Degenerative changes seen throughout the lumbar spine. IMPRESSION: 1. Patent proximal celiac artery stent without evidence of aneurysm, dissection, vasculitis, or significant stenosis. 2. Moderate-sized hiatal hernia. 3. Sigmoid diverticulosis without evidence of diverticulitis. 4. Numerous subcentimeter bladder calculi. 5. Acute compression fracture deformity involving the L2 vertebral body. Aortic Atherosclerosis (ICD10-I70.0). Electronically Signed   By: Aram Candela M.D.   On: 08/05/2019 18:31      IMPRESSION AND PLAN:   1.  Acute L2 compression fracture with intractable low back pain. -The patient will be admitted to a medical bed. -Pain management will be provided with as needed IV Toradol and will utilize narcotics judiciously. -Neurosurgery consultation will be obtained by Dr. Adriana Simas who was notified about the patient. -Physical therapy consult to be obtained. -We will hold his aspirin and Eliquis for now.  2.  Sinus tachycardia. -This could be secondary to pain and mild volume depletion. -We will monitor his heart rate with pain management and on telemetry. -He will be continued on his Toprol-XL and given elevated blood pressure will increase the dose  3.  Dyslipidemia. -We will continue his statin therapy.  4.  Hypothyroidism. -We will continue his Synthroid and check TSH.  5.  Type 2 diabetes mellitus with diabetic neuropathy. -We will continue his basal coverage and place him on supplement coverage with NovoLog. -We will continue his Neurontin.  6.  Hypertension, suboptimally control. -Continue Toprol-XL at a higher dose  7.  Atrial ablation status post pacemaker placement. -We will continue Toprol-XL and temporarily hold off Eliquis and aspirin in case he needs kyphoplasty.  8.  DVT prophylaxis. -SCDs. -We will holding off medical prophylaxis until neurosurgery consultation in case he needs kyphoplasty.    All the  records are reviewed and case discussed with ED provider. The plan of care was discussed in details with the patient (and family). I answered all questions. The patient agreed to proceed with the above mentioned plan. Further management will depend upon hospital course.   CODE STATUS: Full code  Status is: Inpatient  Remains inpatient appropriate because:Ongoing active pain requiring inpatient pain management, Unsafe d/c plan, IV treatments appropriate due to intensity of illness or inability to take PO and Inpatient level of care appropriate due to severity of illness   Dispo: The patient is from: Home              Anticipated d/c is to: Home              Anticipated  d/c date is: 2 days              Patient currently is not medically stable to d/c.   TOTAL TIME TAKING CARE OF THIS PATIENT: 55 minutes.    Hannah Beat M.D on 08/05/2019 at 11:42 PM  [facility]   From 7 PM-7 AM, contact night-coverage [email or web address]  CC: Primary care physician; Mickey Farber, MD   Note: This dictation was prepared with Dragon dictation along with smaller phrase technology. Any transcriptional errors that result from this process are unintentional.

## 2019-08-05 NOTE — ED Triage Notes (Signed)
Patient arrived via EMS from home due to increased heart rate. Per EMS the family states that when patient has an increased heart rate he may have an infection. Patient also has had a hx of stroke and has a hard time with moving left side pain in left shoulder and hip. Patient has a pacemaker and a DNR. VSS NAD noted.

## 2019-08-05 NOTE — ED Provider Notes (Signed)
[facility] Emergency Department Provider Note  ____________________________________________   First MD Initiated Contact with Patient 08/05/19 1546     (approximate)  I have reviewed the triage vital signs and the nursing notes.  History  Chief Complaint Tachycardia    HPI Kevinmichael Deboise is a 79 y.o. 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  Per EMS, the family was checking the patient's vital signs and noticed his heart rate to be elevated.  Family says that typically when his HR is elevated he has some kind of infection or abnormality, therefore they called EMS for further evaluation.    Patient himself reports generalized pain related to his RA, as well as unchanged periumbilical abdominal discomfort, which has been ongoing both prior to and after his vascular intervention.  This has been evaluated by vascular surgery as recently as 08/01/19 in clinic, felt not to be related to his celiac artery intervention. He also reports several weeks of low back pain, no known trauma. Located to the lower back, aching, no radiation, no alleviating/aggravating components, mild to moderate in severity. Has two small burn areas to the lower back where he accidentally slept with a heating pad on.   Patient reports taking all of his medications as prescribed.  Has been eating well.  No vomiting or diarrhea.  No chest pain, shortness of breath, cough.  No urinary symptoms.  Has been fully vaccinated against COVID.   Past Medical Hx Past Medical History:  Diagnosis Date  . Anxiety   . Arthritis   . Asthma   . Atrial fibrillation (HCC)   . 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 COPD (chronic obstructive pulmonary disease) (HCC)    . Dementia (HCC)   . Diabetes mellitus without complication (HCC)   . Dyspnea   . GERD (gastroesophageal reflux disease)   . Headache    every morning on waking  . HTN (hypertension) 04/11/2017  . Hyperlipidemia 02/18/2009  . Hypertension   . Kidney stones   . 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 Paralysis (HCC)   . Presence of permanent cardiac pacemaker   . Sleep apnea   . Stroke (HCC)   . Tremor 07/12/2016    Problem List Patient Active Problem List   Diagnosis Date Noted  . 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 Gastroparesis due to DM (HCC) 06/20/2019  . Goals of care, counseling/discussion 06/11/2019  . Celiac artery stenosis (HCC) 06/04/2019  . Motor restlessness 04/05/2018  . Other insomnia 02/10/2018  . Anxiety disorder 11/23/2017  . Mechanical complication of gastrostomy (HCC)   . Gastrostomy tube in place (HCC) 08/11/2017  . 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 Benign prostatic hyperplasia 04/24/2017  . Degenerative arthritis 04/24/2017  . Sleep apnea 04/24/2017  . HTN (hypertension) 04/11/2017  . Diabetes (HCC) 04/11/2017  . History of stroke 04/11/2017  . COPD (chronic obstructive pulmonary disease) (HCC) 04/11/2017  . Sinoatrial node dysfunction (HCC) 04/11/2017  . Hypoglycemia 04/11/2017  . 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 Chronic idiopathic constipation 10/10/2016  . Diabetic polyneuropathy (HCC) 10/10/2016  . Difficulty walking 09/23/2016  . Tremor 07/12/2016  . Unstable gait 07/12/2016  . 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 Chronic anticoagulation 01/24/2014  . High  risk medication use 10/16/2013  . Pure hypercholesterolemia 10/13/2013  . Seropositive rheumatoid arthritis of multiple joints ([place]) 10/13/2013  . Atherosclerotic heart disease of native coronary artery without angina pectoris 02/18/2009  . Gastro-esophageal reflux disease  without esophagitis 02/18/2009  . Hyperlipidemia 02/18/2009  . Rheumatoid arthritis ([place]) 02/18/2009    Past Surgical Hx Past Surgical History:  Procedure Laterality Date  . APPENDECTOMY    . CORONARY ANGIOPLASTY    . ESOPHAGOGASTRODUODENOSCOPY    . ESOPHAGOGASTRODUODENOSCOPY (EGD) WITH PROPOFOL N/A 06/10/2019   Procedure: ESOPHAGOGASTRODUODENOSCOPY (EGD) WITH PROPOFOL;  Surgeon: Jonathon Bellows, MD;  Location: [facility] ENDOSCOPY;  Service: Gastroenterology;  Laterality: N/A;  . HERNIA REPAIR    . PACEMAKER INSERTION    . PACEMAKER INSERTION    . PEG PLACEMENT N/A 09/26/2017   Procedure: PERCUTANEOUS ENDOSCOPIC GASTROSTOMY (PEG) PLACEMENT;  Surgeon: Lucilla Lame, MD;  Location: [facility] ENDOSCOPY;  Service: Endoscopy;  Laterality: N/A;  . PEG TUBE PLACEMENT    . VISCERAL ANGIOGRAPHY N/A 06/11/2019   Procedure: VISCERAL ANGIOGRAPHY;  Surgeon: Katha Cabal, MD;  Location: [place] CV LAB;  Service: Cardiovascular;  Laterality: N/A;  . VISCERAL ANGIOGRAPHY N/A 06/25/2019   Procedure: VISCERAL ANGIOGRAPHY;  Surgeon: Katha Cabal, MD;  Location: [place] CV LAB;  Service: Cardiovascular;  Laterality: N/A;    Medications Prior to Admission medications   Medication Sig Start Date End Date Taking? Authorizing Provider  acetaminophen (TYLENOL) 500 MG tablet Take 500-1,000 mg by mouth 4 (four) times daily as needed for mild pain or fever.     [provider]  albuterol (PROVENTIL) (2.5 MG/3ML) 0.083% nebulizer solution Take 2.5 mg by nebulization every 6 (six) hours as needed for wheezing or shortness of breath.     [provider]  albuterol (VENTOLIN HFA) 108 (90 Base) MCG/ACT inhaler Inhale 2 puffs into the lungs every 4 (four) hours as needed for wheezing or shortness of breath.    [provider]  ALPRAZolam Duanne Moron) 0.25 MG tablet Take 0.25 mg by mouth 3 (three) times daily as needed for anxiety.     [provider]  apixaban (ELIQUIS) 5 MG TABS  tablet Take 5 mg by mouth 2 (two) times daily.    [provider]  apixaban (ELIQUIS) 5 MG TABS tablet Take by mouth. 07/05/19   [provider]  Artificial Saliva (BIOTENE DRY MOUTH MOISTURIZING) SOLN Take 1 spray by mouth every 2 (two) hours as needed (dry mouth).     [provider]  aspirin EC 81 MG tablet Take 81 mg by mouth daily.     [provider]  atorvastatin (LIPITOR) 40 MG tablet Take 40 mg by mouth at bedtime.     [provider]  bisacodyl (DULCOLAX) 5 MG EC tablet Take 5 mg by mouth daily as needed for mild constipation.     [provider]  bismuth subsalicylate (KAOPECTATE) 262 MG/15ML suspension Take 30 mLs by mouth every 6 (six) hours as needed for indigestion. kaopectate    [provider]  collagenase (SANTYL) ointment Apply topically. 07/26/19   [provider]  diclofenac Sodium (VOLTAREN) 1 % GEL Apply 2 g topically daily as needed (Shoulder pain).     [provider]  docusate sodium (COLACE) 100 MG capsule Take 100 mg by mouth daily.    [provider]  DULoxetine (CYMBALTA) 60 MG capsule Take 60 mg by mouth daily.     [provider]  famotidine (PEPCID) 40 MG tablet Take 1  tablet (40 mg total) by mouth 2 (two) times daily. 05/20/19   [name], MD  finasteride (PROSCAR) 5 MG tablet Take 5 mg by mouth at bedtime.     [provider]  folic acid (FOLVITE) 1 MG tablet Take 1 mg by mouth daily.    [provider]  gabapentin (NEURONTIN) 300 MG capsule Take 300 mg by mouth at bedtime.     [provider]  HUMALOG KWIKPEN 100 UNIT/ML KiwkPen Inject 1-3 Units into the skin every evening. Sliding scale    [provider]  Insulin Detemir (LEVEMIR FLEXTOUCH) 100 UNIT/ML Pen Inject 20 Units into the skin daily.     [provider]  levothyroxine (SYNTHROID) 125 MCG tablet Take 125 mcg by mouth daily.     [provider]  magnesium  hydroxide (MILK OF MAGNESIA) 400 MG/5ML suspension Take 30 mLs by mouth daily as needed for mild constipation.    [provider]  melatonin 5 MG TABS Take by mouth.    [provider]  Methotrexate Sodium (METHOTREXATE, PF,) 200 MG/8ML injection Inject 50 mg/m2 into the skin every Sunday. 0.6 ml    [provider]  metoCLOPramide (REGLAN) 10 MG tablet Take 1 tablet (10 mg total) by mouth 3 (three) times daily before meals. Patient not taking: Reported on 07/10/2019 06/20/19   Schnier, Latina Craver, MD  metoprolol succinate (TOPROL-XL) 25 MG 24 hr tablet Take 25 mg by mouth 2 (two) times daily.    [provider]  mirtazapine (REMERON) 15 MG tablet Take 30 mg by mouth at bedtime.     [provider]  polyethylene glycol (MIRALAX / GLYCOLAX) 17 g packet Take 17 g by mouth daily as needed for mild constipation.    [provider]  senna-docusate (SENOKOT-S) 8.6-50 MG tablet Take 2 tablets by mouth at bedtime.     [provider]  silver sulfADIAZINE (SILVADENE) 1 % cream Apply topically. 07/26/19 07/25/20  [provider]  sucralfate (CARAFATE) 1 g tablet Take 1 tablet (1 g total) by mouth 4 (four) times daily for 10 days. Patient not taking: Reported on 06/11/2019 05/15/19 05/25/19  Phineas Semen, MD  traMADol (ULTRAM) 50 MG tablet Take 50 mg by mouth in the morning, at noon, and at bedtime.     [provider]  traMADol (ULTRAM) 50 MG tablet Take 1 tablet (50 mg total) by mouth every 6 (six) hours as needed for moderate pain or severe pain. 06/26/19   Stegmayer, Cala Bradford A, PA-C  trolamine salicylate (BLUE-EMU HEMP) 10 % cream Apply 1 application topically daily as needed for muscle pain.    [provider]    Allergies Effexor xr [venlafaxine hcl er], Lisinopril, Other, Oxycodone-acetaminophen, Budesonide-formoterol fumarate, Fluticasone, and Propoxyphene  Family Hx Family History  Problem Relation Age of Onset  .  Heart failure Mother   . Clotting disorder Mother   . Heart failure Father   . Hypertension Father   . Diabetes Father   . Stroke Brother   . Lung cancer Sister     Social Hx Social History   Tobacco Use  . Smoking status: Former Smoker    Quit date: 1989    Years since quitting: 32.4  . Smokeless tobacco: Never Used  Substance Use Topics  . Alcohol use: No  . Drug use: No     Review of Systems  Constitutional: Negative for fever. Negative for chills. Eyes: Negative for visual changes. ENT: Negative for sore throat.  Cardiovascular: Negative for chest pain.  Positive for tachycardia. Respiratory: Negative for shortness of breath. Gastrointestinal: Negative for nausea. Negative for vomiting.  Positive for chronic abdominal pain. Genitourinary: Negative for dysuria. Musculoskeletal: Negative for leg swelling.  Positive for chronic joint pain, low back pain. Skin: Negative for rash. Neurological: Negative for headaches.   Physical Exam  Vital Signs: ED Triage Vitals  Enc Vitals Group     BP 08/05/19 1552 122/78     Pulse Rate 08/05/19 1552 (!) 116     Resp 08/05/19 1552 14     Temp 08/05/19 1552 99.1 F (37.3 C)     Temp Source 08/05/19 1552 Oral     SpO2 08/05/19 1552 100 %     Weight 08/05/19 1553 207 lb 14.3 oz (94.3 kg)     Height 08/05/19 1553 6\' 3"  (1.905 m)     Head Circumference --      Peak Flow --      Pain Score 08/05/19 1552 0     Pain Loc --      Pain Edu? --      Excl. in GC? --     Constitutional: Alert and oriented.  NAD.  Head: Normocephalic. Atraumatic. Eyes: Conjunctivae clear. Sclera anicteric. Pupils equal and symmetric. Nose: No masses or lesions. No congestion or rhinorrhea. Mouth/Throat: Wearing mask.  Neck: No stridor. Trachea midline.  Cardiovascular: Tachycardic, irregular. Extremities well perfused. Pacemaker in upper chest. Respiratory: Normal respiratory effort.  Lungs CTAB. Gastrointestinal: Soft. Non-distended. Non-tender.   Genitourinary: Deferred. Musculoskeletal: No lower extremity edema. No deformities. Neurologic: History of CVA with resultant left-sided hemiplegia, at baseline. No other acute superimposed focal neurological deficits.  Skin: Dressing to lower back where he has small wounds 2/2 heating pad burn (as above). Psychiatric: Mood and affect are appropriate for situation.  EKG  Personally reviewed and interpreted by myself.   Date: 08/05/19 Time: 1640 Rate: 114 Rhythm: ventricular paced Axis: left Intervals: wide QRS 2/2 paced rhythm Negative Sgarbossa criteria    Radiology  Personally reviewed available imaging myself.   CXR - IMPRESSION:  No active disease.    Procedures  Procedure(s) performed (including critical care):  Procedures   Initial Impression / Assessment and Plan / MDM / ED Course  79 y.o. 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 Will administer fluids, metoprolol for HR control (takes BID metoprolol at home) and reassess.  CT reveals acute compression fracture of L2, this is likely the etiology of his ongoing back pain.  Lactic acid mildly elevated 2.1, receiving fluids and will recheck.   Remainder of work-up largely unremarkable, including remainder of imaging and labs.  Otherwise, electrolytes without actionable derangements.  No anemia.  UA negative for infection.  Brief improvement in HR with dose of IV metoprolol, patient is also due for his nighttime PO dose of metoprolol.  Will administer this, receiving fluids, pain control, and continue to monitor heart rate.   Will  plan to admit for further pain control, HR control (suspect pain contributing at least somewhat to this). Discussed with NSGY for spine, who will see in AM and evaluate for need for brace.   Patient and wife at bedside agreeable w/  plan. Repeat lactic acid WNL.    _______________________________   As part of my medical decision making I have reviewed available labs, radiology tests, reviewed old records/performed chart review, obtained additional history from family, and discussed with consultants (Dr. Adriana Simas, NSGY).     Final Clinical Impression(s) / ED Diagnosis  Final diagnoses:  Tachycardia  Compression fracture of L2 vertebra, initial encounter [facility])       Note:  This document was prepared using Dragon voice recognition software and may include unintentional dictation errors.   Miguel Aschoff., MD 08/05/19 2105

## 2019-08-06 DIAGNOSIS — S32020A Wedge compression fracture of second lumbar vertebra, initial encounter for closed fracture: Secondary | ICD-10-CM | POA: Diagnosis not present

## 2019-08-06 DIAGNOSIS — M545 Low back pain: Secondary | ICD-10-CM

## 2019-08-06 DIAGNOSIS — R000 Tachycardia, unspecified: Secondary | ICD-10-CM | POA: Diagnosis not present

## 2019-08-06 DIAGNOSIS — M549 Dorsalgia, unspecified: Secondary | ICD-10-CM | POA: Diagnosis present

## 2019-08-06 LAB — CBC
HCT: 38.3 % — ABNORMAL LOW (ref 39.0–52.0)
Hemoglobin: 12.8 g/dL — ABNORMAL LOW (ref 13.0–17.0)
MCH: 31.7 pg (ref 26.0–34.0)
MCHC: 33.4 g/dL (ref 30.0–36.0)
MCV: 94.8 fL (ref 80.0–100.0)
Platelets: 239 10*3/uL (ref 150–400)
RBC: 4.04 MIL/uL — ABNORMAL LOW (ref 4.22–5.81)
RDW: 13.8 % (ref 11.5–15.5)
WBC: 8.9 10*3/uL (ref 4.0–10.5)
nRBC: 0 % (ref 0.0–0.2)

## 2019-08-06 LAB — GLUCOSE, CAPILLARY
Glucose-Capillary: 120 mg/dL — ABNORMAL HIGH (ref 70–99)
Glucose-Capillary: 161 mg/dL — ABNORMAL HIGH (ref 70–99)

## 2019-08-06 LAB — BASIC METABOLIC PANEL
Anion gap: 3 — ABNORMAL LOW (ref 5–15)
BUN: 14 mg/dL (ref 8–23)
CO2: 27 mmol/L (ref 22–32)
Calcium: 8.7 mg/dL — ABNORMAL LOW (ref 8.9–10.3)
Chloride: 110 mmol/L (ref 98–111)
Creatinine, Ser: 0.77 mg/dL (ref 0.61–1.24)
GFR calc Af Amer: >60 mL/min (ref >60)
GFR calc non Af Amer: >60 mL/min (ref >60)
Glucose, Bld: 131 mg/dL — ABNORMAL HIGH (ref 70–99)
Potassium: 4.2 mmol/L (ref 3.5–5.1)
Sodium: 140 mmol/L (ref 135–145)

## 2019-08-06 MED ORDER — APIXABAN 5 MG PO TABS: 5.0000 mg | ORAL_TABLET | Freq: Two times a day (BID) | ORAL | Status: DC

## 2019-08-06 MED ORDER — BISACODYL 5 MG PO TBEC: 5.0000 mg | DELAYED_RELEASE_TABLET | Freq: Every day | ORAL | Status: DC | PRN

## 2019-08-06 MED ORDER — INSULIN ASPART 100 UNIT/ML ~~LOC~~ SOLN: 0.0000 [IU] | Freq: Three times a day (TID) | SUBCUTANEOUS | Status: DC

## 2019-08-06 MED ORDER — MAGNESIUM HYDROXIDE 400 MG/5ML PO SUSP: 30.0000 mL | Freq: Every day | ORAL | Status: DC | PRN

## 2019-08-06 NOTE — Discharge Summary (Signed)
Physician Discharge Summary  Oscar Brock ZOX:[id number] DOB: September 28, 1940 DOA: 08/05/2019  PCP: Mickey Farber, MD  Admit date: 08/05/2019 Discharge date: 08/06/2019  Admitted From: Home Disposition:  Home  Recommendations for Outpatient Follow-up:  1. Follow up with PCP in 1-2 weeks 2. Please obtain BMP/CBC in one week 3. Please follow up on the following pending results:None  Home Health: Yes-existing Equipment/Devices: Wheelchair Discharge Condition: Fair CODE STATUS: Full Diet recommendation: Heart Healthy / Carb Modified    Brief/Interim Summary: Oscar Brock  is a 79 y.o. Caucasian male with a known history of with a known history of asthma, COPD, hypertension, dyslipidemia, atrial fibrillation, type 2 diabetes mellitus, CVA with residual left-sided hemiplegia and obstructive sleep apnea, presented to the emergency room with onset of tachycardia while checking his vital signs with concern about possible infection that can sometimes be associated with his tachycardia.  He has been having generalized pain related to his rheumatoid arthritis as well as periumbilical abdominal discomfort that is still going on after his celiac artery stent that was placed recently last month.  Is also been having intractable low back pain which started about a few weeks ago and get significantly worse.  The patient is bed to wheelchair.  He did not have any reported falls or trauma per his wife.  There were no new paresthesias in either of his lower extremities.  His left leg is weak from his previous stroke and he gets spasms in his right leg occasionally with mild restlessness.  No fever or chills.  He denies any nausea or vomiting.  Is having recent chronic abdominal pain for which she had a celiac artery stent last month.  No chest pain or dyspnea or cough or wheezing.   Abdominal pelvic CTA scan revealed patent proximal celiac artery stent without evidence for aneurysm, dissection vasculitis or  significant stenosis, moderate sized hiatal hernia and sigmoid diverticulosis without diverticulitis and numerous subcentimeter bladder calculi and acute L2 vertebral compression fracture deformity.  Neurosurgery was consulted and they were recommending conservative management with LSO brace and an outpatient follow-up with them. Discussed with wife and she already had home health services established and will continue with them.  Patient has only complaining of pain with moving around. Heart rate in low 90s.  Denies any chest pain or shortness of breath.  He was discharged home with instructions to follow-up with neurosurgery and his PCP.  Patient will continue rest of his home meds.  Discharge Diagnoses:  Active Problems:   Intractable back pain   Intractable low back pain   Back pain  Discharge Instructions  Discharge Instructions    Diet - low sodium heart healthy   Complete by: As directed    Increase activity slowly   Complete by: As directed      Allergies as of 08/06/2019      Reactions   Effexor Xr [venlafaxine Hcl Er] Other (See Comments)   unknown   Lisinopril Other (See Comments)   unknown   Other Other (See Comments)   Jitters/nervous   Oxycodone-acetaminophen Other (See Comments)   Jitters wild   Budesonide-formoterol Fumarate    Other reaction(s): Other (See Comments) hoarseness   Fluticasone    Other reaction(s): Other (See Comments) hoarseness   Propoxyphene Other (See Comments)   Jittery/nervous Jitters/nervous      Medication List    TAKE these medications   acetaminophen 500 MG tablet Commonly known as: TYLENOL Take 500-1,000 mg by mouth 4 (four) times daily as  needed for mild pain or fever.   albuterol (2.5 MG/3ML) 0.083% nebulizer solution Commonly known as: PROVENTIL Take 2.5 mg by nebulization every 6 (six) hours as needed for wheezing or shortness of breath.   albuterol 108 (90 Base) MCG/ACT inhaler Commonly known as: VENTOLIN HFA Inhale  2 puffs into the lungs every 4 (four) hours as needed for wheezing or shortness of breath.   ALPRAZolam 0.25 MG tablet Commonly known as: XANAX Take 0.25 mg by mouth 3 (three) times daily as needed for anxiety.   apixaban 5 MG Tabs tablet Commonly known as: ELIQUIS Take 5 mg by mouth 2 (two) times daily. What changed: Another medication with the same name was removed. Continue taking this medication, and follow the directions you see here.   aspirin EC 81 MG tablet Take 81 mg by mouth daily.   atorvastatin 40 MG tablet Commonly known as: LIPITOR Take 40 mg by mouth at bedtime.   Biotene Dry Mouth Moisturizing Soln Take 1 spray by mouth every 2 (two) hours as needed (dry mouth).   bisacodyl 5 MG EC tablet Commonly known as: DULCOLAX Take 5 mg by mouth daily as needed for mild constipation.   Blue-Emu Hemp 10 % cream Generic drug: trolamine salicylate Apply 1 application topically daily as needed for muscle pain.   docusate sodium 100 MG capsule Commonly known as: COLACE Take 100 mg by mouth daily.   DULoxetine 60 MG capsule Commonly known as: CYMBALTA Take 60 mg by mouth daily.   famotidine 40 MG tablet Commonly known as: PEPCID Take 1 tablet (40 mg total) by mouth 2 (two) times daily.   finasteride 5 MG tablet Commonly known as: PROSCAR Take 5 mg by mouth at bedtime.   folic acid 1 MG tablet Commonly known as: FOLVITE Take 1 mg by mouth daily.   gabapentin 300 MG capsule Commonly known as: NEURONTIN Take 300 mg by mouth at bedtime.   HumaLOG KwikPen 100 UNIT/ML KwikPen Generic drug: insulin lispro Inject 1-3 Units into the skin every evening. Sliding scale   Kaopectate 262 MG/15ML suspension Generic drug: bismuth subsalicylate Take 30 mLs by mouth every 6 (six) hours as needed for indigestion. kaopectate   Levemir FlexTouch 100 UNIT/ML FlexPen Generic drug: insulin detemir Inject 20 Units into the skin daily.   levothyroxine 125 MCG tablet Commonly  known as: SYNTHROID Take 125 mcg by mouth daily.   magnesium hydroxide 400 MG/5ML suspension Commonly known as: MILK OF MAGNESIA Take 30 mLs by mouth daily as needed for mild constipation.   melatonin 5 MG Tabs Take by mouth.   methotrexate (PF) 200 MG/8ML injection Inject 50 mg/m2 into the skin every Sunday. 0.6 ml   metoCLOPramide 10 MG tablet Commonly known as: Reglan Take 1 tablet (10 mg total) by mouth 3 (three) times daily before meals.   metoprolol succinate 25 MG 24 hr tablet Commonly known as: TOPROL-XL Take 25 mg by mouth 2 (two) times daily.   mirtazapine 15 MG tablet Commonly known as: REMERON Take 30 mg by mouth at bedtime.   polyethylene glycol 17 g packet Commonly known as: MIRALAX / GLYCOLAX Take 17 g by mouth daily as needed for mild constipation.   Santyl ointment Generic drug: collagenase Apply topically.   senna-docusate 8.6-50 MG tablet Commonly known as: Senokot-S Take 2 tablets by mouth at bedtime.   silver sulfADIAZINE 1 % cream Commonly known as: SILVADENE Apply topically.   traMADol 50 MG tablet Commonly known as: ULTRAM Take 1 tablet (50  mg total) by mouth every 6 (six) hours as needed for moderate pain or severe pain. What changed: Another medication with the same name was removed. Continue taking this medication, and follow the directions you see here.   Voltaren 1 % Gel Generic drug: diclofenac Sodium Apply 2 g topically daily as needed (Shoulder pain).      Follow-up Information    Mickey Farber, MD Follow up.   Specialty: Internal Medicine Contact information: [address] [facility] [place] [address] [phone]        Lucy Chris, MD. Schedule an appointment as soon as possible for a visit.   Specialty: Neurosurgery Contact information: [address] [place] [address] [phone]          Allergies  Allergen Reactions  . Effexor Xr [Venlafaxine Hcl Er] Other (See Comments)     unknown  . Lisinopril Other (See Comments)    unknown  . Other Other (See Comments)    Jitters/nervous  . Oxycodone-Acetaminophen Other (See Comments)    Jitters wild  . Budesonide-Formoterol Fumarate     Other reaction(s): Other (See Comments) hoarseness  . Fluticasone     Other reaction(s): Other (See Comments) hoarseness  . Propoxyphene Other (See Comments)    Jittery/nervous Jitters/nervous     Consultations:  Neurosurgery  Procedures/Studies: DG Chest Port 1 View  Result Date: 08/05/2019 CLINICAL DATA:  Tachycardia EXAM: PORTABLE CHEST 1 VIEW COMPARISON:  05/15/2019 FINDINGS: Left-sided pacing device. No focal opacity or pleural effusion. Mild elevation left diaphragm as before. Stable cardiomediastinal silhouette with aortic atherosclerosis. No pneumothorax. IMPRESSION: No active disease. Electronically Signed   By: Jasmine Pang M.D.   On: 08/05/2019 16:41   VAS [place] MESENTERIC  Result Date: 07/15/2019 ABDOMINAL VISCERAL Vascular Interventions: 06/11/2019: Abnormal Aortogram AP and lateral views.                         Selective Injection of the Celiac First order catheter                         placement.                          06/25/2019: Aortogram and Selective Angiogram of the                         Slenic Artery. PTZA of the Celiac Trunk. Stent to the                         Celiac Trunk. Performing Technologist: Debbe Bales RVS  Examination Guidelines: A complete evaluation includes B-mode imaging, spectral Doppler, color Doppler, and power Doppler as needed of all accessible portions of each vessel. Bilateral testing is considered an integral part of a complete examination. Limited examinations for reoccurring indications may be performed as noted.  Duplex Findings: +--------------------+--------+--------+------+--------+ Mesenteric          PSV cm/sEDV cm/sPlaqueComments +--------------------+--------+--------+------+--------+ Aorta Mid              68       18                  +--------------------+--------+--------+------+--------+ Celiac Artery Origin  101      22                  +--------------------+--------+--------+------+--------+  SMA Origin            101      28                  +--------------------+--------+--------+------+--------+ SMA Proximal          102      27                  +--------------------+--------+--------+------+--------+ SMA Mid               105      29                  +--------------------+--------+--------+------+--------+    Summary: Largest Aortic Diameter: 2.4 cm  Mesenteric: Normal Celiac artery and Superior Mesenteric artery findings. Limited Visualization; the Patient was unable to lie down to properly assess the Hepatic Artery and Splenic Artery. Bowel Gas hindered Visualization as well.  *See table(s) above for measurements and observations.  Diagnosing physician: Hortencia Pilar MD  Electronically signed by Hortencia Pilar MD on 07/15/2019 at 9:54:29 AM.    Final    CT Angio Abd/Pel w/ and/or w/o  Result Date: 08/05/2019 CLINICAL DATA:  Elevated heart rate. EXAM: CTA ABDOMEN AND PELVIS WITHOUT AND WITH CONTRAST TECHNIQUE: Multidetector CT imaging of the abdomen and pelvis was performed using the standard protocol during bolus administration of intravenous contrast. Multiplanar reconstructed images and MIPs were obtained and reviewed to evaluate the vascular anatomy. CONTRAST:  119mL OMNIPAQUE IOHEXOL 350 MG/ML SOLN COMPARISON:  May 19, 2019 FINDINGS: VASCULAR Aorta: Mild to moderate severity calcification of a normal caliber aorta without aneurysm, dissection, vasculitis or significant stenosis. Celiac: Patent proximal celiac artery stent without evidence of aneurysm, dissection, vasculitis or significant stenosis. SMA: Patent without evidence of aneurysm, dissection, vasculitis or significant stenosis. Renals: Both renal arteries are patent without evidence of aneurysm, dissection, vasculitis,  fibromuscular dysplasia or significant stenosis. IMA: Patent without evidence of aneurysm, dissection, vasculitis or significant stenosis. Inflow: Mild to moderate severity calcification of the bilateral common iliac arteries which are patent without evidence of aneurysm, dissection, vasculitis or significant stenosis. Proximal Outflow: There is moderate severity calcification of the bilateral common femoral arteries. The visualized portions of the superficial and profunda femoral arteries are patent without evidence of aneurysm, dissection, vasculitis or significant stenosis. Veins: No obvious venous abnormality within the limitations of this arterial phase study. Review of the MIP images confirms the above findings. NON-VASCULAR Lower chest: No acute abnormality. Hepatobiliary: No focal liver abnormality is seen. No gallstones, gallbladder wall thickening, or biliary dilatation. Pancreas: Unremarkable. No pancreatic ductal dilatation or surrounding inflammatory changes. Spleen: Normal in size without focal abnormality. Adrenals/Urinary Tract: Adrenal glands are unremarkable. Kidneys are normal in size, without renal calculi or hydronephrosis. A 2.7 cm simple cyst is seen within the anterior aspect of the mid left kidney. Numerous subcentimeter calcifications are seen within the dependent portion of the urinary bladder. Stomach/Bowel: There is a moderate-sized hiatal hernia. The appendix is not identified. No evidence of bowel wall thickening, distention, or inflammatory changes. Noninflamed diverticula are seen throughout the sigmoid colon. Lymphatic: No abnormal abdominal or pelvic lymph nodes are identified. Reproductive: The prostate gland is mildly enlarged. Other: No abdominal wall hernia or abnormality. No abdominopelvic ascites. Musculoskeletal: An acute compression fracture deformity is seen involving the L2 vertebral body. This represents a new finding when compared to the prior study. Degenerative  changes seen throughout the lumbar spine. IMPRESSION: 1. Patent proximal celiac artery stent without evidence  of aneurysm, dissection, vasculitis, or significant stenosis. 2. Moderate-sized hiatal hernia. 3. Sigmoid diverticulosis without evidence of diverticulitis. 4. Numerous subcentimeter bladder calculi. 5. Acute compression fracture deformity involving the L2 vertebral body. Aortic Atherosclerosis (ICD10-I70.0). Electronically Signed   By: Aram Candela M.D.   On: 08/05/2019 18:31     Subjective: Patient was resting comfortably when seen today.  Wife was at bedside.  Denies any pain while resting, stating that he experience pain when he moves.  Denies any tingling or numbness.  Denies any focal deficit.  Patient has dysarthria at baseline.  Discharge Exam: Vitals:   08/06/19 0630 08/06/19 0700  BP: 109/74 108/76  Pulse: (!) 107 (!) 103  Resp: 16 16  Temp:    SpO2: 96% 99%   Vitals:   08/06/19 0530 08/06/19 0600 08/06/19 0630 08/06/19 0700  BP: 99/69 91/80 109/74 108/76  Pulse: 95 (!) 106 (!) 107 (!) 103  Resp: 18 16 16 16   Temp:      TempSrc:      SpO2: 95% 98% 96% 99%  Weight:      Height:        General: Pt is alert, awake, not in acute distress Cardiovascular: RRR, S1/S2 +, no rubs, no gallops Respiratory: CTA bilaterally, no wheezing, no rhonchi Abdominal: Soft, NT, ND, bowel sounds + Extremities: no edema, no cyanosis   The results of significant diagnostics from this hospitalization (including imaging, microbiology, ancillary and laboratory) are listed below for reference.    Microbiology: Recent Results (from the past 240 hour(s))  SARS Coronavirus 2 by RT PCR (hospital order, performed in [facility] hospital lab) Nasopharyngeal Nasopharyngeal Swab     Status: None   Collection Time: 08/05/19  8:20 PM   Specimen: Nasopharyngeal Swab  Result Value Ref Range Status   SARS Coronavirus 2 NEGATIVE NEGATIVE Final    Comment: (NOTE) SARS-CoV-2 target nucleic  acids are NOT DETECTED. The SARS-CoV-2 RNA is generally detectable in upper and lower respiratory specimens during the acute phase of infection. The lowest concentration of SARS-CoV-2 viral copies this assay can detect is 250 copies / mL. A negative result does not preclude SARS-CoV-2 infection and should not be used as the sole basis for treatment or other patient management decisions.  A negative result may occur with improper specimen collection / handling, submission of specimen other than nasopharyngeal swab, presence of viral mutation(s) within the areas targeted by this assay, and inadequate number of viral copies (<250 copies / mL). A negative result must be combined with clinical observations, patient history, and epidemiological information. Fact Sheet for Patients:   08/07/19 Fact Sheet for Healthcare Providers: BoilerBrush.com.cy This test is not yet approved or cleared  by the https://pope.com/ FDA and has been authorized for detection and/or diagnosis of SARS-CoV-2 by FDA under an Emergency Use Authorization (EUA).  This EUA will remain in effect (meaning this test can be used) for the duration of the COVID-19 declaration under Section 564(b)(1) of the Act, 21 U.S.C. section 360bbb-3(b)(1), unless the authorization is terminated or revoked sooner. Performed at [facility], [address] [place], [place]      Labs: BNP (last 3 results) No results for input(s): BNP in the last 8760 hours. Basic Metabolic Panel: Recent Labs  Lab 08/05/19 1620 08/06/19 0539  NA 142 140  K 4.5 4.2  CL 107 110  CO2 27 27  GLUCOSE 134* 131*  BUN 12 14  CREATININE 0.75 0.77  CALCIUM 9.0 8.7*  Liver Function Tests: Recent Labs  Lab 08/05/19 1620  AST 30  ALT 27  ALKPHOS 90  BILITOT 0.8  PROT 6.8  ALBUMIN 3.5   Recent Labs  Lab 08/05/19 1620  LIPASE 21   No results for input(s): AMMONIA in the  last 168 hours. CBC: Recent Labs  Lab 08/05/19 1620 08/06/19 0539  WBC 8.5 8.9  NEUTROABS 4.9  --   HGB 13.6 12.8*  HCT 40.7 38.3*  MCV 96.2 94.8  PLT 258 239   Cardiac Enzymes: No results for input(s): CKTOTAL, CKMB, CKMBINDEX, TROPONINI in the last 168 hours. BNP: Invalid input(s): POCBNP CBG: Recent Labs  Lab 08/06/19 1012  GLUCAP 120*   D-Dimer No results for input(s): DDIMER in the last 72 hours. Hgb A1c No results for input(s): HGBA1C in the last 72 hours. Lipid Profile No results for input(s): CHOL, HDL, LDLCALC, TRIG, CHOLHDL, LDLDIRECT in the last 72 hours. Thyroid function studies No results for input(s): TSH, T4TOTAL, T3FREE, THYROIDAB in the last 72 hours.  Invalid input(s): FREET3 Anemia work up No results for input(s): VITAMINB12, FOLATE, FERRITIN, TIBC, IRON, RETICCTPCT in the last 72 hours. Urinalysis    Component Value Date/Time   COLORURINE YELLOW (A) 08/05/2019 1620   APPEARANCEUR HAZY (A) 08/05/2019 1620   APPEARANCEUR Clear 08/02/2011 2340   LABSPEC 1.006 08/05/2019 1620   LABSPEC 1.005 08/02/2011 2340   PHURINE 6.0 08/05/2019 1620   GLUCOSEU NEGATIVE 08/05/2019 1620   GLUCOSEU Negative 08/02/2011 2340   HGBUR NEGATIVE 08/05/2019 1620   BILIRUBINUR NEGATIVE 08/05/2019 1620   BILIRUBINUR Negative 08/02/2011 2340   KETONESUR NEGATIVE 08/05/2019 1620   PROTEINUR NEGATIVE 08/05/2019 1620   NITRITE NEGATIVE 08/05/2019 1620   LEUKOCYTESUR NEGATIVE 08/05/2019 1620   LEUKOCYTESUR Negative 08/02/2011 2340   Sepsis Labs Invalid input(s): PROCALCITONIN,  WBC,  LACTICIDVEN Microbiology Recent Results (from the past 240 hour(s))  SARS Coronavirus 2 by RT PCR (hospital order, performed in [facility] hospital lab) Nasopharyngeal Nasopharyngeal Swab     Status: None   Collection Time: 08/05/19  8:20 PM   Specimen: Nasopharyngeal Swab  Result Value Ref Range Status   SARS Coronavirus 2 NEGATIVE NEGATIVE Final    Comment: (NOTE) SARS-CoV-2 target  nucleic acids are NOT DETECTED. The SARS-CoV-2 RNA is generally detectable in upper and lower respiratory specimens during the acute phase of infection. The lowest concentration of SARS-CoV-2 viral copies this assay can detect is 250 copies / mL. A negative result does not preclude SARS-CoV-2 infection and should not be used as the sole basis for treatment or other patient management decisions.  A negative result may occur with improper specimen collection / handling, submission of specimen other than nasopharyngeal swab, presence of viral mutation(s) within the areas targeted by this assay, and inadequate number of viral copies (<250 copies / mL). A negative result must be combined with clinical observations, patient history, and epidemiological information. Fact Sheet for Patients:   BoilerBrush.com.cy Fact Sheet for Healthcare Providers: https://pope.com/ This test is not yet approved or cleared  by the [place] FDA and has been authorized for detection and/or diagnosis of SARS-CoV-2 by FDA under an Emergency Use Authorization (EUA).  This EUA will remain in effect (meaning this test can be used) for the duration of the COVID-19 declaration under Section 564(b)(1) of the Act, 21 U.S.C. section 360bbb-3(b)(1), unless the authorization is terminated or revoked sooner. Performed at [facility], [address] [place], [place] [address]     Time coordinating discharge: Over  30 minutes  SIGNED:  Arnetha Courser, MD  [facility] 08/06/2019, 1:03 PM  If 7PM-7AM, please contact night-coverage [email or web address]  This record has been created using Conservation officer, historic buildings. Errors have been sought and corrected,but may not always be located. Such creation errors do not reflect on the standard of care.

## 2019-08-06 NOTE — ED Notes (Signed)
Brace  Called  Informed  RN  DEE

## 2019-08-06 NOTE — Care Management CC44 (Signed)
Condition Code 44 Documentation Completed  Patient Details  Name: Oscar Brock MRN: [id number] Date of Birth: Mar 10, 1941   Condition Code 44 given:  Yes Patient signature on Condition Code 44 notice:  Yes Documentation of 2 MD's agreement:  Yes Code 44 added to claim:  Yes    Trenton Founds, RN 08/06/2019, 2:28 PM

## 2019-08-06 NOTE — Consult Note (Signed)
Neurosurgery-New Consultation Evaluation 08/06/2019 Oscar Brock [id number]  Identifying Statement: Oscar Brock is a 79 y.o. male from [facility] [place] [address] with lumbar fracture  Physician Requesting Consultation: [place] [facility] emergency department  History of Present Illness: Mr. Oscar Brock is here for evaluation of tachycardia with known history of prior stroke and left hemiplegia.  He additionally has a history of COPD, hypertension, diabetes.  He does have some abdominal discomfort which prompted a CT scan of the abdomen and this did reveal an acute compression fracture at L2.  There were no reported falls.  On my exam today, he does not endorse any significant back pain.  He does not endorse any new weakness or numbness on the right side.  Past Medical History:  Past Medical History:  Diagnosis Date  . Anxiety   . Arthritis   . Asthma   . Atrial fibrillation (HCC)   . 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 COPD (chronic obstructive pulmonary disease) (HCC)   . Dementia (HCC)   . Diabetes mellitus without complication (HCC)   . Dyspnea   . GERD (gastroesophageal reflux disease)   . Headache    every morning on waking  . HTN (hypertension) 04/11/2017  . Hyperlipidemia 02/18/2009  . Hypertension   . Kidney stones   . 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 Paralysis (HCC)   . Presence of permanent cardiac pacemaker   . Sleep apnea   . Stroke (HCC)   . Tremor 07/12/2016    Social History: Social History   Socioeconomic History  . Marital status: Married    Spouse name: Not on file  . Number of children: Not on file  . Years of education: Not on file  . Highest education level: Not on file  Occupational History  . Not on file  Tobacco Use  . Smoking status: Former Smoker    Quit date: 1989    Years since quitting: 32.4  . Smokeless tobacco: Never Used  Substance and Sexual Activity  . Alcohol use: No  . Drug use: No  . Sexual activity:  Not on file  Other Topics Concern  . Not on file  Social History Narrative  . Not on file   Social Determinants of Health   Financial Resource Strain:   . Difficulty of Paying Living Expenses:   Food Insecurity:   . Worried About Programme researcher, broadcasting/film/video in the Last Year:   . Barista in the Last Year:   Transportation Needs:   . Freight forwarder (Medical):   Marland Kitchen Lack of Transportation (Non-Medical):   Physical Activity:   . Days of Exercise per Week:   . Minutes of Exercise per Session:   Stress:   . Feeling of Stress :   Social Connections:   . Frequency of Communication with Friends and Family:   . Frequency of Social Gatherings with Friends and Family:   . Attends Religious Services:   . Active Member of Clubs or Organizations:   . Attends Banker Meetings:   Marland Kitchen Marital Status:   Intimate Partner Violence:   . Fear of Current or Ex-Partner:   . Emotionally Abused:   Marland Kitchen Physically Abused:   . Sexually Abused:    Family History: Family History  Problem Relation Age of Onset  . Heart failure Mother   . Clotting disorder Mother   . Heart failure Father   . Hypertension Father   . Diabetes Father   .  Stroke Brother   . Lung cancer Sister     Review of Systems:  Review of Systems - General ROS: Negative Psychological ROS: Negative Ophthalmic ROS: Negative ENT ROS: Negative Hematological and Lymphatic ROS: Negative  Endocrine ROS: Negative Respiratory ROS: Negative Cardiovascular ROS: Negative Gastrointestinal ROS: Negative Genito-Urinary ROS: Negative Musculoskeletal ROS: Negative for back pain Neurological ROS: Negative for new numbness or weakness, positive for chronic left-sided weakness Dermatological ROS: Negative  Physical Exam: BP 108/76   Pulse (!) 103   Temp 99.1 F (37.3 C) (Oral)   Resp 16   Ht 6\' 3"  (1.905 m)   Wt 94.3 kg   SpO2 99%   BMI 25.98 kg/m  Body mass index is 25.98 kg/m. Body surface area is 2.23 meters  squared. General appearance: Alert and awake Head: Normocephalic, atraumatic Oropharynx: Appears dry Ext: No edema in LE bilaterally,  Neurologic exam:  Mental status: alertness: alert, affect: normal Speech: Severely dysarthric Motor:strength in left lower extremity is 0 out of 5 with increased tone.  On the right he is 5-5 in knee extension, knee flexion, dorsiflexion, plantarflexion. Sensory: Appears intact in bilateral lower extremities to light touch Gait: Not tested  Laboratory: Results for orders placed or performed during the hospital encounter of 08/05/19  SARS Coronavirus 2 by RT PCR (hospital order, performed in [facility] hospital lab) Nasopharyngeal Nasopharyngeal Swab   Specimen: Nasopharyngeal Swab  Result Value Ref Range   SARS Coronavirus 2 NEGATIVE NEGATIVE  Urinalysis, Complete w Microscopic  Result Value Ref Range   Color, Urine YELLOW (A) YELLOW   APPearance HAZY (A) CLEAR   Specific Gravity, Urine 1.006 1.005 - 1.030   pH 6.0 5.0 - 8.0   Glucose, UA NEGATIVE NEGATIVE mg/dL   Hgb urine dipstick NEGATIVE NEGATIVE   Bilirubin Urine NEGATIVE NEGATIVE   Ketones, ur NEGATIVE NEGATIVE mg/dL   Protein, ur NEGATIVE NEGATIVE mg/dL   Nitrite NEGATIVE NEGATIVE   Leukocytes,Ua NEGATIVE NEGATIVE   RBC / HPF 0-5 0 - 5 RBC/hpf   WBC, UA 0-5 0 - 5 WBC/hpf   Bacteria, UA RARE (A) NONE SEEN   Squamous Epithelial / LPF NONE SEEN 0 - 5   Mucus PRESENT   CBC with Differential  Result Value Ref Range   WBC 8.5 4.0 - 10.5 K/uL   RBC 4.23 4.22 - 5.81 MIL/uL   Hemoglobin 13.6 13.0 - 17.0 g/dL   HCT [facility] 84.6 - 65.9 %   MCV 96.2 80.0 - 100.0 fL   MCH 32.2 26.0 - 34.0 pg   MCHC 33.4 30.0 - 36.0 g/dL   RDW 93.5 70.1 - 77.9 %   Platelets 258 150 - 400 K/uL   nRBC 0.0 0.0 - 0.2 %   Neutrophils Relative % 57 %   Neutro Abs 4.9 1.7 - 7.7 K/uL   Lymphocytes Relative 28 %   Lymphs Abs 2.4 0.7 - 4.0 K/uL   Monocytes Relative 10 %   Monocytes Absolute 0.9 0.1 - 1.0 K/uL    Eosinophils Relative 4 %   Eosinophils Absolute 0.3 0.0 - 0.5 K/uL   Basophils Relative 1 %   Basophils Absolute 0.1 0.0 - 0.1 K/uL   Immature Granulocytes 0 %   Abs Immature Granulocytes 0.02 0.00 - 0.07 K/uL  Comprehensive metabolic panel  Result Value Ref Range   Sodium 142 135 - 145 mmol/L   Potassium 4.5 3.5 - 5.1 mmol/L   Chloride 107 98 - 111 mmol/L   CO2 27 22 - 32  mmol/L   Glucose, Bld 134 (H) 70 - 99 mg/dL   BUN 12 8 - 23 mg/dL   Creatinine, Ser 0.75 0.61 - 1.24 mg/dL   Calcium 9.0 8.9 - 10.3 mg/dL   Total Protein 6.8 6.5 - 8.1 g/dL   Albumin 3.5 3.5 - 5.0 g/dL   AST 30 15 - 41 U/L   ALT 27 0 - 44 U/L   Alkaline Phosphatase 90 38 - 126 U/L   Total Bilirubin 0.8 0.3 - 1.2 mg/dL   GFR calc non Af Amer >60 >60 mL/min   GFR calc Af Amer >60 >60 mL/min   Anion gap 8 5 - 15  Lipase, blood  Result Value Ref Range   Lipase 21 11 - 51 U/L  Lactic acid, plasma  Result Value Ref Range   Lactic Acid, Venous 2.1 (HH) 0.5 - 1.9 mmol/L  Lactic acid, plasma  Result Value Ref Range   Lactic Acid, Venous 1.5 0.5 - 1.9 mmol/L  Basic metabolic panel  Result Value Ref Range   Sodium 140 135 - 145 mmol/L   Potassium 4.2 3.5 - 5.1 mmol/L   Chloride 110 98 - 111 mmol/L   CO2 27 22 - 32 mmol/L   Glucose, Bld 131 (H) 70 - 99 mg/dL   BUN 14 8 - 23 mg/dL   Creatinine, Ser 0.77 0.61 - 1.24 mg/dL   Calcium 8.7 (L) 8.9 - 10.3 mg/dL   GFR calc non Af Amer >60 >60 mL/min   GFR calc Af Amer >60 >60 mL/min   Anion gap 3 (L) 5 - 15  CBC  Result Value Ref Range   WBC 8.9 4.0 - 10.5 K/uL   RBC 4.04 (L) 4.22 - 5.81 MIL/uL   Hemoglobin 12.8 (L) 13.0 - 17.0 g/dL   HCT 38.3 (L) 39.0 - 52.0 %   MCV 94.8 80.0 - 100.0 fL   MCH 31.7 26.0 - 34.0 pg   MCHC 33.4 30.0 - 36.0 g/dL   RDW 13.8 11.5 - 15.5 %   Platelets 239 150 - 400 K/uL   nRBC 0.0 0.0 - 0.2 %   I personally reviewed labs  Imaging: CT abdomen 1. 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 2. Moderate-sized hiatal hernia. 3. Sigmoid diverticulosis without evidence of diverticulitis. 4. Numerous subcentimeter bladder calculi. 5. Acute compression fracture deformity involving the L2 vertebral body.  Impression/Plan:  Mr. Achor is here with what appears to be of compression fracture of the L2 vertebral body with greater than 50% height loss.  There does not appear to be any change in alignment or severe angulation and therefore no intervention is recommended at this time.  Given that this can cause significant pain, I do recommend a LSO brace to be worn whenever out of bed or sitting upright.  We can follow-up in clinic in 3 weeks for reevaluation with x-rays.   1.  Diagnosis: L2 compression fracture  2.  Plan -LSO brace -Follow-up in clinic in 3 weeks

## 2019-08-06 NOTE — Progress Notes (Signed)
Orthopedic Tech Progress Note Patient Details:  Donavyn Fecher Jun 04, 1940 [id number] Called in order to [facility] for a LSO/VERTALIGN BACK BRACE Patient ID: Dshaun Reppucci, male   DOB: 1940-08-08, 79 y.o.   MRN: [id number]   Donald Pore 08/06/2019, 2:02 PM

## 2019-08-07 LAB — URINE CULTURE: Culture: <10000 — AB

## 2019-08-13 ENCOUNTER — Other Ambulatory Visit: Payer: Self-pay

## 2019-08-13 ENCOUNTER — Inpatient Hospital Stay: Payer: Medicare Other | Attending: Oncology

## 2019-08-13 DIAGNOSIS — I4891 Unspecified atrial fibrillation: Secondary | ICD-10-CM | POA: Diagnosis not present

## 2019-08-13 DIAGNOSIS — I6529 Occlusion and stenosis of unspecified carotid artery: Secondary | ICD-10-CM | POA: Diagnosis not present

## 2019-08-13 DIAGNOSIS — K219 Gastro-esophageal reflux disease without esophagitis: Secondary | ICD-10-CM | POA: Diagnosis not present

## 2019-08-13 DIAGNOSIS — E785 Hyperlipidemia, unspecified: Secondary | ICD-10-CM | POA: Insufficient documentation

## 2019-08-13 DIAGNOSIS — Z7982 Long term (current) use of aspirin: Secondary | ICD-10-CM | POA: Diagnosis not present

## 2019-08-13 DIAGNOSIS — E119 Type 2 diabetes mellitus without complications: Secondary | ICD-10-CM | POA: Insufficient documentation

## 2019-08-13 DIAGNOSIS — Z79899 Other long term (current) drug therapy: Secondary | ICD-10-CM | POA: Diagnosis not present

## 2019-08-13 DIAGNOSIS — Z8673 Personal history of transient ischemic attack (TIA), and cerebral infarction without residual deficits: Secondary | ICD-10-CM | POA: Diagnosis not present

## 2019-08-13 DIAGNOSIS — I10 Essential (primary) hypertension: Secondary | ICD-10-CM | POA: Insufficient documentation

## 2019-08-13 DIAGNOSIS — D72820 Lymphocytosis (symptomatic): Secondary | ICD-10-CM | POA: Insufficient documentation

## 2019-08-13 DIAGNOSIS — Z7901 Long term (current) use of anticoagulants: Secondary | ICD-10-CM | POA: Insufficient documentation

## 2019-08-13 DIAGNOSIS — J449 Chronic obstructive pulmonary disease, unspecified: Secondary | ICD-10-CM | POA: Insufficient documentation

## 2019-08-13 DIAGNOSIS — Z87891 Personal history of nicotine dependence: Secondary | ICD-10-CM | POA: Diagnosis not present

## 2019-08-13 DIAGNOSIS — Z794 Long term (current) use of insulin: Secondary | ICD-10-CM | POA: Diagnosis not present

## 2019-08-13 DIAGNOSIS — F419 Anxiety disorder, unspecified: Secondary | ICD-10-CM | POA: Diagnosis not present

## 2019-08-13 DIAGNOSIS — M549 Dorsalgia, unspecified: Secondary | ICD-10-CM | POA: Diagnosis not present

## 2019-08-13 DIAGNOSIS — R591 Generalized enlarged lymph nodes: Secondary | ICD-10-CM | POA: Diagnosis not present

## 2019-08-13 DIAGNOSIS — F0390 Unspecified dementia without behavioral disturbance: Secondary | ICD-10-CM | POA: Diagnosis not present

## 2019-08-13 DIAGNOSIS — Z950 Presence of cardiac pacemaker: Secondary | ICD-10-CM | POA: Diagnosis not present

## 2019-08-13 DIAGNOSIS — G4730 Sleep apnea, unspecified: Secondary | ICD-10-CM | POA: Diagnosis not present

## 2019-08-13 LAB — CBC WITH DIFFERENTIAL/PLATELET
Abs Immature Granulocytes: 0.02 10*3/uL (ref 0.00–0.07)
Basophils Absolute: 0.1 10*3/uL (ref 0.0–0.1)
Basophils Relative: 1 %
Eosinophils Absolute: 0.4 10*3/uL (ref 0.0–0.5)
Eosinophils Relative: 4 %
HCT: 40.1 % (ref 39.0–52.0)
Hemoglobin: 13.3 g/dL (ref 13.0–17.0)
Immature Granulocytes: 0 %
Lymphocytes Relative: 22 %
Lymphs Abs: 2.2 10*3/uL (ref 0.7–4.0)
MCH: 32.1 pg (ref 26.0–34.0)
MCHC: 33.2 g/dL (ref 30.0–36.0)
MCV: 96.9 fL (ref 80.0–100.0)
Monocytes Absolute: 0.8 10*3/uL (ref 0.1–1.0)
Monocytes Relative: 8 %
Neutro Abs: 6.4 10*3/uL (ref 1.7–7.7)
Neutrophils Relative %: 65 %
Platelets: 273 10*3/uL (ref 150–400)
RBC: 4.14 MIL/uL — ABNORMAL LOW (ref 4.22–5.81)
RDW: 13.8 % (ref 11.5–15.5)
WBC: 9.9 10*3/uL (ref 4.0–10.5)
nRBC: 0 % (ref 0.0–0.2)

## 2019-08-13 LAB — COMPREHENSIVE METABOLIC PANEL
ALT: 38 U/L (ref 0–44)
AST: 40 U/L (ref 15–41)
Albumin: 3.7 g/dL (ref 3.5–5.0)
Alkaline Phosphatase: 88 U/L (ref 38–126)
Anion gap: 11 (ref 5–15)
BUN: 11 mg/dL (ref 8–23)
CO2: 26 mmol/L (ref 22–32)
Calcium: 8.7 mg/dL — ABNORMAL LOW (ref 8.9–10.3)
Chloride: 102 mmol/L (ref 98–111)
Creatinine, Ser: 0.82 mg/dL (ref 0.61–1.24)
GFR calc Af Amer: >60 mL/min (ref >60)
GFR calc non Af Amer: >60 mL/min (ref >60)
Glucose, Bld: 137 mg/dL — ABNORMAL HIGH (ref 70–99)
Potassium: 4 mmol/L (ref 3.5–5.1)
Sodium: 139 mmol/L (ref 135–145)
Total Bilirubin: 0.6 mg/dL (ref 0.3–1.2)
Total Protein: 7.2 g/dL (ref 6.5–8.1)

## 2019-08-13 LAB — LACTATE DEHYDROGENASE: LDH: 195 U/L — ABNORMAL HIGH (ref 98–192)

## 2019-08-14 ENCOUNTER — Encounter: Payer: Self-pay | Admitting: Oncology

## 2019-08-14 ENCOUNTER — Other Ambulatory Visit: Payer: Medicare Other

## 2019-08-14 ENCOUNTER — Inpatient Hospital Stay (HOSPITAL_BASED_OUTPATIENT_CLINIC_OR_DEPARTMENT_OTHER): Payer: Medicare Other | Admitting: Oncology

## 2019-08-14 DIAGNOSIS — D72820 Lymphocytosis (symptomatic): Secondary | ICD-10-CM

## 2019-08-14 DIAGNOSIS — R591 Generalized enlarged lymph nodes: Secondary | ICD-10-CM

## 2019-08-14 NOTE — Progress Notes (Signed)
HEMATOLOGY-ONCOLOGY TeleHEALTH VISIT PROGRESS NOTE  I connected with Oscar Brock on 08/14/19 at  1:30 PM EDT by video enabled telemedicine visit and verified that I am speaking with the correct person using two identifiers. I discussed the limitations, risks, security and privacy concerns of performing an evaluation and management service by telemedicine and the availability of in-person appointments. I also discussed with the patient that there may be a patient responsible charge related to this service. The patient expressed understanding and agreed to proceed.   Other persons participating in the visit and their role in the encounter:  Wife who sets up patient's virtual visit  Patient's location: Home  Provider's location: office Chief Complaint: Lymphadenopathy   INTERVAL HISTORY Oscar Brock is a 79 y.o. male who has above history reviewed by me today presents for follow up visit for management of lymphadenopathy Problems and complaints are listed below:  Patient is status post celiac artery stenting. He had a recent admission due to intractable back pain. Denies any fever, chills, unintentional weight loss.  Review of Systems  Constitutional: Positive for fatigue. Negative for appetite change, chills, fever and unexpected weight change.  HENT:   Negative for hearing loss and voice change.   Eyes: Negative for eye problems and icterus.  Respiratory: Negative for chest tightness, cough and shortness of breath.   Cardiovascular: Negative for chest pain and leg swelling.  Gastrointestinal: Negative for abdominal distention and abdominal pain.  Endocrine: Negative for hot flashes.  Genitourinary: Negative for difficulty urinating, dysuria and frequency.   Musculoskeletal: Positive for back pain. Negative for arthralgias.  Skin: Negative for itching and rash.  Neurological: Negative for light-headedness and numbness.  Hematological: Negative for adenopathy. Does not  bruise/bleed easily.  Psychiatric/Behavioral: Negative for confusion.    Past Medical History:  Diagnosis Date  . Anxiety   . Arthritis   . Asthma   . Atrial fibrillation (HCC)   . 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 COPD (chronic obstructive pulmonary disease) (HCC)   . Dementia (HCC)   . Diabetes mellitus without complication (HCC)   . Dyspnea   . GERD (gastroesophageal reflux disease)   . Headache    every morning on waking  . HTN (hypertension) 04/11/2017  . Hyperlipidemia 02/18/2009  . Hypertension   . Kidney stones   . 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 Paralysis (HCC)   . Presence of permanent cardiac pacemaker   . Sleep apnea   . Stroke (HCC)   . Tremor 07/12/2016   Past Surgical History:  Procedure Laterality Date  . APPENDECTOMY    . CORONARY ANGIOPLASTY    . ESOPHAGOGASTRODUODENOSCOPY    . ESOPHAGOGASTRODUODENOSCOPY (EGD) WITH PROPOFOL N/A 06/10/2019   Procedure: ESOPHAGOGASTRODUODENOSCOPY (EGD) WITH PROPOFOL;  Surgeon: [name], MD;  Location: [facility] ENDOSCOPY;  Service: Gastroenterology;  Laterality: N/A;  . HERNIA REPAIR    . PACEMAKER INSERTION    . PACEMAKER INSERTION    . PEG PLACEMENT N/A 09/26/2017   Procedure: PERCUTANEOUS ENDOSCOPIC GASTROSTOMY (PEG) PLACEMENT;  Surgeon: Midge Minium, MD;  Location: [facility] ENDOSCOPY;  Service: Endoscopy;  Laterality: N/A;  . PEG TUBE PLACEMENT    . VISCERAL ANGIOGRAPHY N/A 06/11/2019   Procedure: VISCERAL ANGIOGRAPHY;  Surgeon: Renford Dills, MD;  Location: [facility] INVASIVE CV LAB;  Service: Cardiovascular;  Laterality: N/A;  . VISCERAL ANGIOGRAPHY N/A 06/25/2019   Procedure: VISCERAL ANGIOGRAPHY;  Surgeon: Renford Dills, MD;  Location: [facility] INVASIVE CV LAB;  Service: Cardiovascular;  Laterality: N/A;  Family History  Problem Relation Age of Onset  . Heart failure Mother   . Clotting disorder Mother   . Heart failure Father   . Hypertension Father   . Diabetes Father   . Stroke  Brother   . Lung cancer Sister     Social History   Socioeconomic History  . Marital status: Married    Spouse name: Not on file  . Number of children: Not on file  . Years of education: Not on file  . Highest education level: Not on file  Occupational History  . Not on file  Tobacco Use  . Smoking status: Former Smoker    Quit date: 1989    Years since quitting: 32.4  . Smokeless tobacco: Never Used  Substance and Sexual Activity  . Alcohol use: No  . Drug use: No  . Sexual activity: Not on file  Other Topics Concern  . Not on file  Social History Narrative  . Not on file   Social Determinants of Health   Financial Resource Strain:   . Difficulty of Paying Living Expenses:   Food Insecurity:   . Worried About Programme researcher, broadcasting/film/video in the Last Year:   . Barista in the Last Year:   Transportation Needs:   . Freight forwarder (Medical):   Marland Kitchen Lack of Transportation (Non-Medical):   Physical Activity:   . Days of Exercise per Week:   . Minutes of Exercise per Session:   Stress:   . Feeling of Stress :   Social Connections:   . Frequency of Communication with Friends and Family:   . Frequency of Social Gatherings with Friends and Family:   . Attends Religious Services:   . Active Member of Clubs or Organizations:   . Attends Banker Meetings:   Marland Kitchen Marital Status:   Intimate Partner Violence:   . Fear of Current or Ex-Partner:   . Emotionally Abused:   Marland Kitchen Physically Abused:   . Sexually Abused:     Current Outpatient Medications on File Prior to Visit  Medication Sig Dispense Refill  . acetaminophen (TYLENOL) 500 MG tablet Take 500-1,000 mg by mouth 4 (four) times daily as needed for mild pain or fever.     Marland Kitchen albuterol (PROVENTIL) (2.5 MG/3ML) 0.083% nebulizer solution Take 2.5 mg by nebulization every 6 (six) hours as needed for wheezing or shortness of breath.     Marland Kitchen albuterol (VENTOLIN HFA) 108 (90 Base) MCG/ACT inhaler Inhale 2 puffs into  the lungs every 4 (four) hours as needed for wheezing or shortness of breath.    . ALPRAZolam (XANAX) 0.25 MG tablet Take 0.25 mg by mouth 3 (three) times daily as needed for anxiety.     Marland Kitchen apixaban (ELIQUIS) 5 MG TABS tablet Take 5 mg by mouth 2 (two) times daily.    . Artificial Saliva (BIOTENE DRY MOUTH MOISTURIZING) SOLN Take 1 spray by mouth every 2 (two) hours as needed (dry mouth).     Marland Kitchen aspirin EC 81 MG tablet Take 81 mg by mouth daily.     Marland Kitchen atorvastatin (LIPITOR) 40 MG tablet Take 40 mg by mouth at bedtime.     . bisacodyl (DULCOLAX) 5 MG EC tablet Take 5 mg by mouth daily as needed for mild constipation.     . bismuth subsalicylate (KAOPECTATE) 262 MG/15ML suspension Take 30 mLs by mouth every 6 (six) hours as needed for indigestion. kaopectate    . collagenase (SANTYL) ointment  Apply topically.    . diclofenac Sodium (VOLTAREN) 1 % GEL Apply 2 g topically daily as needed (Shoulder pain).     Marland Kitchen docusate sodium (COLACE) 100 MG capsule Take 100 mg by mouth daily.    . DULoxetine (CYMBALTA) 60 MG capsule Take 60 mg by mouth daily.     . famotidine (PEPCID) 40 MG tablet Take 1 tablet (40 mg total) by mouth 2 (two) times daily. 180 tablet 1  . finasteride (PROSCAR) 5 MG tablet Take 5 mg by mouth at bedtime.     . folic acid (FOLVITE) 1 MG tablet Take 1 mg by mouth daily.    Marland Kitchen gabapentin (NEURONTIN) 300 MG capsule Take 300 mg by mouth at bedtime.     Marland Kitchen HUMALOG KWIKPEN 100 UNIT/ML KiwkPen Inject 1-3 Units into the skin every evening. Sliding scale    . Insulin Detemir (LEVEMIR FLEXTOUCH) 100 UNIT/ML Pen Inject 20 Units into the skin daily.     Marland Kitchen levothyroxine (SYNTHROID) 125 MCG tablet Take 125 mcg by mouth daily.     . magnesium hydroxide (MILK OF MAGNESIA) 400 MG/5ML suspension Take 30 mLs by mouth daily as needed for mild constipation.    . melatonin 5 MG TABS Take by mouth.    . Methotrexate Sodium (METHOTREXATE, PF,) 200 MG/8ML injection Inject 50 mg/m2 into the skin every Sunday. 0.6  ml    . metoprolol succinate (TOPROL-XL) 25 MG 24 hr tablet Take 50 mg by mouth 2 (two) times daily.     . mirtazapine (REMERON) 15 MG tablet Take 30 mg by mouth at bedtime.     . polyethylene glycol (MIRALAX / GLYCOLAX) 17 g packet Take 17 g by mouth daily as needed for mild constipation.    . senna-docusate (SENOKOT-S) 8.6-50 MG tablet Take 2 tablets by mouth at bedtime.     . silver sulfADIAZINE (SILVADENE) 1 % cream Apply topically.    . traMADol (ULTRAM) 50 MG tablet Take 1 tablet (50 mg total) by mouth every 6 (six) hours as needed for moderate pain or severe pain. 30 tablet 0  . trolamine salicylate (BLUE-EMU HEMP) 10 % cream Apply 1 application topically daily as needed for muscle pain.    Marland Kitchen metoCLOPramide (REGLAN) 10 MG tablet Take 1 tablet (10 mg total) by mouth 3 (three) times daily before meals. (Patient not taking: Reported on 07/10/2019) 21 tablet 1   No current facility-administered medications on file prior to visit.    Allergies  Allergen Reactions  . Effexor Xr [Venlafaxine Hcl Er] Other (See Comments)    unknown  . Lisinopril Other (See Comments)    unknown  . Other Other (See Comments)    Jitters/nervous  . Oxycodone-Acetaminophen Other (See Comments)    Jitters wild  . Budesonide-Formoterol Fumarate     Other reaction(s): Other (See Comments) hoarseness  . Fluticasone     Other reaction(s): Other (See Comments) hoarseness  . Propoxyphene Other (See Comments)    Jittery/nervous Jitters/nervous        Observations/Objective: Today's Vitals   08/14/19 1313  PainSc: 0-No pain   There is no height or weight on file to calculate BMI.  Physical Exam  Constitutional: No distress.  Neurological: He is alert.    CBC    Component Value Date/Time   WBC 9.9 08/13/2019 1051   RBC 4.14 (L) 08/13/2019 1051   HGB 13.3 08/13/2019 1051   HGB 11.7 (L) 09/20/2011 0433   HCT 40.1 08/13/2019 1051   HCT 34.6 (  L) 09/20/2011 0433   PLT 273 08/13/2019 1051   PLT 138  (L) 09/20/2011 0433   MCV 96.9 08/13/2019 1051   MCV 90 09/20/2011 0433   MCH 32.1 08/13/2019 1051   MCHC 33.2 08/13/2019 1051   RDW 13.8 08/13/2019 1051   RDW 15.2 (H) 09/20/2011 0433   LYMPHSABS 2.2 08/13/2019 1051   LYMPHSABS 1.8 09/20/2011 0433   MONOABS 0.8 08/13/2019 1051   MONOABS 0.6 09/20/2011 0433   EOSABS 0.4 08/13/2019 1051   EOSABS 0.1 09/20/2011 0433   BASOSABS 0.1 08/13/2019 1051   BASOSABS 0.1 09/20/2011 0433    CMP     Component Value Date/Time   NA 139 08/13/2019 1051   NA 143 09/20/2011 0433   K 4.0 08/13/2019 1051   K 4.4 09/20/2011 0433   CL 102 08/13/2019 1051   CL 108 (H) 09/20/2011 0433   CO2 26 08/13/2019 1051   CO2 25 09/20/2011 0433   GLUCOSE 137 (H) 08/13/2019 1051   GLUCOSE 106 (H) 09/20/2011 0433   BUN 11 08/13/2019 1051   BUN 13 09/20/2011 0433   CREATININE 0.82 08/13/2019 1051   CREATININE 0.87 09/20/2011 0433   CALCIUM 8.7 (L) 08/13/2019 1051   CALCIUM 8.6 09/20/2011 0433   PROT 7.2 08/13/2019 1051   PROT 7.4 09/19/2011 1405   ALBUMIN 3.7 08/13/2019 1051   ALBUMIN 3.9 09/19/2011 1405   AST 40 08/13/2019 1051   AST 29 09/19/2011 1405   ALT 38 08/13/2019 1051   ALT 41 09/19/2011 1405   ALKPHOS 88 08/13/2019 1051   ALKPHOS 69 09/19/2011 1405   BILITOT 0.6 08/13/2019 1051   BILITOT 0.9 09/19/2011 1405   GFRNONAA >60 08/13/2019 1051   GFRNONAA >60 09/20/2011 0433   GFRAA >60 08/13/2019 1051   GFRAA >60 09/20/2011 0433     Assessment and Plan: 1. Lymphadenopathy   2. Monoclonal B-cell lymphocytosis     Patient had CT scan done on 08/05/2019. No lymphadenopathy was noted on imaging.  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  No intervention is needed.  Continue observation.  Follow Up Instructions: 1 year   I discussed  the assessment and treatment plan with the patient. The patient was provided an opportunity to ask questions and all were answered. The patient agreed with the plan and demonstrated an understanding of the instructions.  The patient was advised to call back or seek an in-person evaluation if the symptoms worsen or if the condition fails to improve as anticipated.    Earlie Server, MD 08/14/2019 7:52 PM

## 2019-08-14 NOTE — Progress Notes (Signed)
Patient's wife is concerned about him being lethargic today.  She is thinking he is depressed.  Appetite has greatly decreased.  His last documented weight was 208 at cardiology appt last week.  Chronic pain from stroke.

## 2019-08-19 ENCOUNTER — Other Ambulatory Visit: Payer: Self-pay

## 2019-08-19 ENCOUNTER — Ambulatory Visit: Payer: Medicare Other | Admitting: Gastroenterology

## 2019-08-19 VITALS — BP 108/62 | HR 98 | Temp 98.0°F | Ht 75.0 in | Wt 208.0 lb

## 2019-08-19 DIAGNOSIS — K3184 Gastroparesis: Secondary | ICD-10-CM

## 2019-08-19 DIAGNOSIS — K5903 Drug induced constipation: Secondary | ICD-10-CM | POA: Diagnosis not present

## 2019-08-19 NOTE — Progress Notes (Signed)
Jonathon Bellows MD, MRCP(U.K) [address]  [place]  [place], [place] [address]  Main: [phone]  Fax: [phone]   Primary Care Physician: Ezequiel Kayser, MD  Primary Gastroenterologist:  Dr. Jonathon Bellows   Abdominal pain follow-up  HPI: Oscar Brock is a 79 y.o. male    Summary of history :  Last seen on 05/20/2019.  Recent ER visit for abdominal pain He had a CT angiogram of the chest abdomen and pelvis to rule out dissection and was found to have persistent retroperitoneal and mesenteric adenopathy this is of unknown clinical significance lymphoma not excluded.  Multiple stones in the urinary bladder.On 05/15/2019 he had a CT scan of the abdomen showed mildly thickened appearance of the gastroesophageal junction which may be related to underdistention.  Mild haziness in the mesentery with multiple mildly enlarged lymph nodes.  Labs on 05/20/2019 for a lipase was normal, troponin normal, CBC normal, CMP elevated glucose low chloride of 97 but creatinine of 0.84.Abdominal pain ongoing for 4 weeks.  Lower abdomen in nature.  Not had a bowel movement for 4 days at least.  On tramadol as well as laxatives.  He has been on OxyContin in the past.  Likely contributed to his constipation.  Denies any fevers but has had occasional night sweats.  No loss of weight.  Denies any dysphagia but has had heartburn.  Takes Pepcid daily.  Interval history 05/20/2019-08/19/2019 Admit to the hospital in May 2021 with tachycardia. 06/11/2019 EGD large amount of food seen in the stomach otherwise evaluation was normal Repeat CT scan on 08/05/2019 showed no abnormalities and plan was to repeat imaging in 1 years time. Moderate-sized hiatal hernia also noted.  Compression fracture of L2 noted  He states that since his last visit he is feeling much better.  Abdominal pain is better.  Having regular bowel movements.  Still feels bloated at times.  Gassy at times. Current Outpatient Medications  Medication  Sig Dispense Refill  . acetaminophen (TYLENOL) 500 MG tablet Take 500-1,000 mg by mouth 4 (four) times daily as needed for mild pain or fever.     Marland Kitchen albuterol (PROVENTIL) (2.5 MG/3ML) 0.083% nebulizer solution Take 2.5 mg by nebulization every 6 (six) hours as needed for wheezing or shortness of breath.     Marland Kitchen albuterol (VENTOLIN HFA) 108 (90 Base) MCG/ACT inhaler Inhale 2 puffs into the lungs every 4 (four) hours as needed for wheezing or shortness of breath.    . ALPRAZolam (XANAX) 0.25 MG tablet Take 0.25 mg by mouth 3 (three) times daily as needed for anxiety.     Marland Kitchen apixaban (ELIQUIS) 5 MG TABS tablet Take 5 mg by mouth 2 (two) times daily.    . Artificial Saliva (BIOTENE DRY MOUTH MOISTURIZING) SOLN Take 1 spray by mouth every 2 (two) hours as needed (dry mouth).     Marland Kitchen aspirin EC 81 MG tablet Take 81 mg by mouth daily.     Marland Kitchen atorvastatin (LIPITOR) 40 MG tablet Take 40 mg by mouth at bedtime.     . bisacodyl (DULCOLAX) 5 MG EC tablet Take 5 mg by mouth daily as needed for mild constipation.     . bismuth subsalicylate (KAOPECTATE) 262 MG/15ML suspension Take 30 mLs by mouth every 6 (six) hours as needed for indigestion. kaopectate    . collagenase (SANTYL) ointment Apply topically.    . diclofenac Sodium (VOLTAREN) 1 % GEL Apply 2 g topically daily as needed (Shoulder pain).     Marland Kitchen  docusate sodium (COLACE) 100 MG capsule Take 100 mg by mouth daily.    . DULoxetine (CYMBALTA) 60 MG capsule Take 60 mg by mouth daily.     . famotidine (PEPCID) 40 MG tablet Take 1 tablet (40 mg total) by mouth 2 (two) times daily. 180 tablet 1  . finasteride (PROSCAR) 5 MG tablet Take 5 mg by mouth at bedtime.     . folic acid (FOLVITE) 1 MG tablet Take 1 mg by mouth daily.    Marland Kitchen gabapentin (NEURONTIN) 300 MG capsule Take 300 mg by mouth at bedtime.     Marland Kitchen HUMALOG KWIKPEN 100 UNIT/ML KiwkPen Inject 1-3 Units into the skin every evening. Sliding scale    . Insulin Detemir (LEVEMIR FLEXTOUCH) 100 UNIT/ML Pen Inject 20  Units into the skin daily.     Marland Kitchen levothyroxine (SYNTHROID) 125 MCG tablet Take 125 mcg by mouth daily.     . magnesium hydroxide (MILK OF MAGNESIA) 400 MG/5ML suspension Take 30 mLs by mouth daily as needed for mild constipation.    . melatonin 5 MG TABS Take by mouth.    . Methotrexate Sodium (METHOTREXATE, PF,) 200 MG/8ML injection Inject 50 mg/m2 into the skin every Sunday. 0.6 ml    . metoCLOPramide (REGLAN) 10 MG tablet Take 1 tablet (10 mg total) by mouth 3 (three) times daily before meals. (Patient not taking: Reported on 07/10/2019) 21 tablet 1  . metoprolol succinate (TOPROL-XL) 25 MG 24 hr tablet Take 50 mg by mouth 2 (two) times daily.     . mirtazapine (REMERON) 15 MG tablet Take 30 mg by mouth at bedtime.     . polyethylene glycol (MIRALAX / GLYCOLAX) 17 g packet Take 17 g by mouth daily as needed for mild constipation.    . senna-docusate (SENOKOT-S) 8.6-50 MG tablet Take 2 tablets by mouth at bedtime.     . silver sulfADIAZINE (SILVADENE) 1 % cream Apply topically.    . traMADol (ULTRAM) 50 MG tablet Take 1 tablet (50 mg total) by mouth every 6 (six) hours as needed for moderate pain or severe pain. 30 tablet 0  . trolamine salicylate (BLUE-EMU HEMP) 10 % cream Apply 1 application topically daily as needed for muscle pain.     No current facility-administered medications for this visit.    Allergies as of 08/19/2019 - Review Complete 08/14/2019  Allergen Reaction Noted  . Effexor xr [venlafaxine hcl er] Other (See Comments) 05/22/2016  . Lisinopril Other (See Comments) 05/22/2016  . Other Other (See Comments) 07/13/2012  . Oxycodone-acetaminophen Other (See Comments) 02/13/2017  . Budesonide-formoterol fumarate  02/21/2014  . Fluticasone  02/21/2014  . Propoxyphene Other (See Comments) 07/13/2012    ROS:  General: Negative for anorexia, weight loss, fever, chills, fatigue, weakness. ENT: Negative for hoarseness, difficulty swallowing , nasal congestion. CV: Negative for  chest pain, angina, palpitations, dyspnea on exertion, peripheral edema.  Respiratory: Negative for dyspnea at rest, dyspnea on exertion, cough, sputum, wheezing.  GI: See history of present illness. GU:  Negative for dysuria, hematuria, urinary incontinence, urinary frequency, nocturnal urination.  Endo: Negative for unusual weight change.    Physical Examination:   There were no vitals taken for this visit.  General: Alert and comfortable in a wheelchair Eyes: No icterus. Conjunctivae pink. Lungs: Clear to auscultation bilaterally. Non-labored. Heart: Regular rate and rhythm, no murmurs rubs or gallops.  Abdomen: Bowel sounds are normal, nontender, nondistended, no hepatosplenomegaly or masses, no abdominal bruits or hernia , no rebound or guarding.  Neuro: Alert and oriented x 3.  Grossly intact. Psych: Alert and cooperative, normal mood and affect.   Imaging Studies: DG Chest Port 1 View  Result Date: 08/05/2019 CLINICAL DATA:  Tachycardia EXAM: PORTABLE CHEST 1 VIEW COMPARISON:  05/15/2019 FINDINGS: Left-sided pacing device. No focal opacity or pleural effusion. Mild elevation left diaphragm as before. Stable cardiomediastinal silhouette with aortic atherosclerosis. No pneumothorax. IMPRESSION: No active disease. Electronically Signed   By: Jasmine Pang M.D.   On: 08/05/2019 16:41   CT Angio Abd/Pel w/ and/or w/o  Result Date: 08/05/2019 CLINICAL DATA:  Elevated heart rate. EXAM: CTA ABDOMEN AND PELVIS WITHOUT AND WITH CONTRAST TECHNIQUE: Multidetector CT imaging of the abdomen and pelvis was performed using the standard protocol during bolus administration of intravenous contrast. Multiplanar reconstructed images and MIPs were obtained and reviewed to evaluate the vascular anatomy. CONTRAST:  OMNIPAQUE IOHEXOL 350 MG/ML SOLN COMPARISON:  May 19, 2019 FINDINGS: VASCULAR Aorta: Mild to moderate severity calcification of a normal caliber aorta without aneurysm, dissection,  vasculitis or significant stenosis. Celiac: Patent proximal celiac artery stent without evidence of aneurysm, dissection, vasculitis or significant stenosis. SMA: Patent without evidence of aneurysm, dissection, vasculitis or significant stenosis. Renals: Both renal arteries are patent without evidence of aneurysm, dissection, vasculitis, fibromuscular dysplasia or significant stenosis. IMA: Patent without evidence of aneurysm, dissection, vasculitis or significant stenosis. Inflow: Mild to moderate severity calcification of the bilateral common iliac arteries which are patent without evidence of aneurysm, dissection, vasculitis or significant stenosis. Proximal Outflow: There is moderate severity calcification of the bilateral common femoral arteries. The visualized portions of the superficial and profunda femoral arteries are patent without evidence of aneurysm, dissection, vasculitis or significant stenosis. Veins: No obvious venous abnormality within the limitations of this arterial phase study. Review of the MIP images confirms the above findings. NON-VASCULAR Lower chest: No acute abnormality. Hepatobiliary: No focal liver abnormality is seen. No gallstones, gallbladder wall thickening, or biliary dilatation. Pancreas: Unremarkable. No pancreatic ductal dilatation or surrounding inflammatory changes. Spleen: Normal in size without focal abnormality. Adrenals/Urinary Tract: Adrenal glands are unremarkable. Kidneys are normal in size, without renal calculi or hydronephrosis. A 2.7 cm simple cyst is seen within the anterior aspect of the mid left kidney. Numerous subcentimeter calcifications are seen within the dependent portion of the urinary bladder. Stomach/Bowel: There is a moderate-sized hiatal hernia. The appendix is not identified. No evidence of bowel wall thickening, distention, or inflammatory changes. Noninflamed diverticula are seen throughout the sigmoid colon. Lymphatic: No abnormal abdominal or  pelvic lymph nodes are identified. Reproductive: The prostate gland is mildly enlarged. Other: No abdominal wall hernia or abnormality. No abdominopelvic ascites. Musculoskeletal: An acute compression fracture deformity is seen involving the L2 vertebral body. This represents a new finding when compared to the prior study. Degenerative changes seen throughout the lumbar spine. IMPRESSION: 1. Patent proximal celiac artery stent without evidence of aneurysm, dissection, vasculitis, or significant stenosis. 2. Moderate-sized hiatal hernia. 3. Sigmoid diverticulosis without evidence of diverticulitis. 4. Numerous subcentimeter bladder calculi. 5. Acute compression fracture deformity involving the L2 vertebral body. Aortic Atherosclerosis (ICD10-I70.0). Electronically Signed   By: Aram Candela M.D.   On: 08/05/2019 18:31    Assessment and Plan:   Oscar Brock is a 79 y.o. y/o male to follow-up for abdominal pain.  Earlier this year CT scan of the abdomen showed mesenteric lymphadenopathy as well as thickening of his esophagus.  Has also had zoster affecting his abdomen.  History of constipation, has been  on tramadol recent EGD showed food in the stomach digestive of possible gastroparesis.  Plan 1.    Likely has gastroparesis.  Counseled on eating foods low in fiber, low in fat, spacing meals and consuming smaller quantities. 2.    Continue Pepcid twice daily.  Would try to avoid Reglan as he has other cardiac issues. 3.     He is on tramadol.  Seems like he needs it to function.  Explained that it is very likely the reason for gastroparesis constipation  Dr [name]  MD,MRCP [facility]) Follow up in 8 weeks telephone visit

## 2019-08-19 NOTE — Patient Instructions (Signed)
Gastroparesis  Gastroparesis is a condition in which food takes longer than normal to empty from the stomach. The condition is usually long-lasting (chronic). It may also be called delayed gastric emptying. There is no cure, but there are treatments and things that you can do at home to help relieve symptoms. Treating the underlying condition that causes gastroparesis can also help relieve symptoms. What are the causes? In many cases, the cause of this condition is not known. Possible causes include:  A hormone (endocrine) disorder, such as hypothyroidism or diabetes.  A nervous system disease, such as Parkinson's disease or multiple sclerosis.  Cancer, infection, or surgery that affects the stomach or vagus nerve. The vagus nerve runs from your chest, through your neck, to the lower part of your brain.  A connective tissue disorder, such as scleroderma.  Certain medicines. What increases the risk? You are more likely to develop this condition if you:  Have certain disorders or diseases, including: ? An endocrine disorder. ? An eating disorder. ? Amyloidosis. ? Scleroderma. ? Parkinson's disease. ? Multiple sclerosis. ? Cancer or infection of the stomach or the vagus nerve.  Have had surgery on the stomach or vagus nerve.  Take certain medicines.  Are male. What are the signs or symptoms? Symptoms of this condition include:  Feeling full after eating very little.  Nausea.  Vomiting.  Heartburn.  Abdominal bloating.  Inconsistent blood sugar (glucose) levels on blood tests.  Lack of appetite.  Weight loss.  Acid from the stomach coming up into the esophagus (gastroesophageal reflux).  Sudden tightening (spasm) of the stomach, which can be painful. Symptoms may come and go. Some people may not notice any symptoms. How is this diagnosed? This condition is diagnosed with tests, such as:  Tests that check how long it takes food to move through the stomach and  intestines. These tests include: ? Upper gastrointestinal (GI) series. For this test, you drink a liquid that shows up well on X-rays, and then X-rays will be taken of your intestines. ? Gastric emptying scintigraphy. For this test, you eat food that contains a small amount of radioactive material, and then scans are taken. ? Wireless capsule GI monitoring system. For this test, you swallow a pill (capsule) that records information about how foods and fluid move through your stomach.  Gastric manometry. For this test, a tube is passed down your throat and into your stomach to measure electrical and muscular activity.  Endoscopy. For this test, a long, thin tube is passed down your throat and into your stomach to check for problems in your stomach lining.  Ultrasound. This test uses sound waves to create images of inside the body. This can help rule out gallbladder disease or pancreatitis as a cause of your symptoms. How is this treated? There is no cure for gastroparesis. Treatment may include:  Treating the underlying cause.  Managing your symptoms by making changes to your diet and exercise habits.  Taking medicines to control nausea and vomiting and to stimulate stomach muscles.  Getting food through a feeding tube in the hospital. This may be done in severe cases.  Having surgery to insert a device into your body that helps improve stomach emptying and control nausea and vomiting (gastric neurostimulator). Follow these instructions at home:  Take over-the-counter and prescription medicines only as told by your health care provider.  Follow instructions from your health care provider about eating or drinking restrictions. Your health care provider may recommend that you: ? Eat   smaller meals more often. ? Eat low-fat foods. ? Eat low-fiber forms of high-fiber foods. For example, eat cooked vegetables instead of raw vegetables. ? Have only liquid foods instead of solid foods. Liquid  foods are easier to digest.  Drink enough fluid to keep your urine pale yellow.  Exercise as often as told by your health care provider.  Keep all follow-up visits as told by your health care provider. This is important. Contact a health care provider if you:  Notice that your symptoms do not improve with treatment.  Have new symptoms. Get help right away if you:  Have severe abdominal pain that does not improve with treatment.  Have nausea that is severe or does not go away.  Cannot drink fluids without vomiting. Summary  Gastroparesis is a chronic condition in which food takes longer than normal to empty from the stomach.  Symptoms include nausea, vomiting, heartburn, abdominal bloating, and loss of appetite.  Eating smaller portions, and low-fat, low-fiber foods may help you manage your symptoms.  Get help right away if you have severe abdominal pain. This information is not intended to replace advice given to you by your health care provider. Make sure you discuss any questions you have with your health care provider. Document Revised: 05/29/2017 Document Reviewed: 01/03/2017 Elsevier Patient Education  2020 [employer]  

## 2019-08-26 DIAGNOSIS — S32020A Wedge compression fracture of second lumbar vertebra, initial encounter for closed fracture: Secondary | ICD-10-CM | POA: Insufficient documentation

## 2019-08-29 ENCOUNTER — Other Ambulatory Visit: Payer: Self-pay | Admitting: Nurse Practitioner

## 2019-08-29 ENCOUNTER — Other Ambulatory Visit (HOSPITAL_COMMUNITY): Payer: Self-pay | Admitting: Nurse Practitioner

## 2019-08-29 DIAGNOSIS — Z8781 Personal history of (healed) traumatic fracture: Secondary | ICD-10-CM

## 2019-09-12 ENCOUNTER — Ambulatory Visit (HOSPITAL_COMMUNITY)
Admission: RE | Admit: 2019-09-12 | Discharge: 2019-09-12 | Disposition: A | Discharge status: Home / Self Care | Payer: Medicare Other | Source: Ambulatory Visit | Attending: Nurse Practitioner | Admitting: Nurse Practitioner

## 2019-09-12 ENCOUNTER — Other Ambulatory Visit: Payer: Self-pay

## 2019-09-12 DIAGNOSIS — Z8781 Personal history of (healed) traumatic fracture: Secondary | ICD-10-CM | POA: Insufficient documentation

## 2019-09-12 NOTE — Progress Notes (Signed)
Patient in for MRI with pacemaker. [employer] [place] notified and transmission sent. No changes to be made at this time.

## 2019-09-27 ENCOUNTER — Other Ambulatory Visit: Payer: Self-pay | Admitting: Orthopedic Surgery

## 2019-09-30 ENCOUNTER — Other Ambulatory Visit: Payer: Self-pay

## 2019-09-30 ENCOUNTER — Other Ambulatory Visit
Admission: RE | Admit: 2019-09-30 | Discharge: 2019-09-30 | Disposition: A | Discharge status: Home / Self Care | Payer: Medicare Other | Source: Ambulatory Visit | Attending: Orthopedic Surgery | Admitting: Orthopedic Surgery

## 2019-09-30 DIAGNOSIS — Z01812 Encounter for preprocedural laboratory examination: Secondary | ICD-10-CM | POA: Insufficient documentation

## 2019-09-30 DIAGNOSIS — Z20822 Contact with and (suspected) exposure to covid-19: Secondary | ICD-10-CM | POA: Insufficient documentation

## 2019-09-30 LAB — SARS CORONAVIRUS 2 (TAT 6-24 HRS): SARS Coronavirus 2: NEGATIVE

## 2019-09-30 NOTE — Progress Notes (Signed)
  [name] Perioperative Services: Pre-Admission/Anesthesia Testing     Date: 09/30/19  Name: Oscar Brock MRN:   [id number]  Re: Pacemaker form (perioperative programming orders)   Patient is an acute add on (2 hour early) for 10/01/2019 with Dr. Rosita Kea. Patient has a pacemaker. Contact by SDS for cardiac programming device clearance. Chart reviewed. Cardiologist/EP is at [facility]. Form faxed to [facility] provider Herbert Deaner, MD) for review and completion. Form requested on an urgent basis as patient is scheduled for an L2 kyphoplasty tomorrow. Will follow up with cardiology practice later today to determine status of completed documentation.   Quentin Mulling, MSN, APRN, FNP-C, CEN [facility]  Peri-operative Services Nurse Practitioner Phone: [phone] 09/30/19 9:15 AM

## 2019-09-30 NOTE — H&P (Signed)
Chief Complaint  Patient presents with  . Establish Care  L2 Compression fx   History of the Present Illness: Oscar Brock is a 79 y.o. male here for evaluation of an L2 compression fracture. He had a visit to see Mickey Farber, MD back in 03/2019. He subsequently had an MRI confirming this with Patsey Berthold, NP with neurosurgery. He comes in today to discuss potential treatment.  The patient has a shoulder problem as a result of a stroke. The patient presents with his wife, who states he is on Eliquis and he last took it this morning. She states she is reluctant for the patient to have a vertebroplasty due to the side effects of being off his Eliquis. He was prescribed this by Dr. Harrington Challenger. She states he went to the hospital 8-10 weeks ago due to his abnormal blood pressure, abnormal blood glucose and being confused. The patient had an MRI on 09/12/2019. The patient states he feels like he has an 8-pound hammer hitting him in the middle of his back. Due to his stroke, he leans to the left.   The patient is a retired Games developer.  I have reviewed past medical, surgical, social and family history, and allergies as documented in the EMR.  Past Medical History: Past Medical History:  Diagnosis Date  . 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 Atrial fibrillation (CMS-HCC)  s/p cardioversion s/p Rythmol  . Benign prostatic hypertrophy  . CAD (coronary artery disease)  . CVA (cerebral vascular accident) (CMS-HCC) 09/2011  right parietal  . Degenerative arthritis  a. Lumbar disc disease  . Diabetes mellitus type 2, uncomplicated (CMS-HCC)  . Essential hypertension, benign  . GERD (gastroesophageal reflux disease)  . History of nephrolithiasis  . Lack of energy 10/24/17-10/25/17  . Migraines  . Obesity  . Pulmonary emphysema (CMS-HCC)  . Pure hypercholesterolemia  . Rheumatoid arthritis  (CMS-HCC) 10/13/2013  a. Erosive b. Methotrexate c. s/p Prednisone d. Raynaud's  . Sleep apnea  intolerant of BiPAP  . Valvular heart disease  a. Atrial enlargement b.  . Zoster 04/15/2019  Left thoracic dermatomal   Past Surgical History: Past Surgical History:  Procedure Laterality Date  . APPENDECTOMY 01/2011  . INGUINAL HERNIA REPAIR Bilateral 03/2006  . Nephrolithiasis surgery  . OTHER SURGERY 04/2012  ablation  . pacemaker insertion surgery  . 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 Gilda Crease   Past Family History: Family History  Problem Relation Age of Onset  . Heart failure Mother  . Clotting disorder Mother  . Diabetes Father  . High blood pressure (Hypertension) Father  . Heart failure Father  . Arthritis Father  . Stroke Brother  . Lung cancer Sister   Medications: Current Outpatient Medications Ordered in Epic  Medication Sig Dispense Refill  . acetaminophen (TYLENOL) 500 MG tablet Take 500-1,000 mg by mouth every 4 (four) hours as needed for Pain  . albuterol (PROVENTIL) 2.5 mg /3 mL (0.083 %) nebulizer solution Take 3 mLs (2.5 mg total) by nebulization every 6 (six) hours as needed for Wheezing 225 mL 3  . albuterol 90 mcg/actuation inhaler Inhale 2 inhalations into the lungs every 4 (four) hours as needed for Wheezing or Shortness of Breath 3 Inhaler 3  . ALPRAZolam (XANAX) 0.25 MG tablet Take 1 tablet (0.25 mg total) by mouth 3 (three) times daily as needed  for Anxiety 90 tablet 0  . aluminum & magnesium hydroxide-simethicone, DUH/DRH, (MYLANTA MAXIMUM) 400-400-40 mg/5 mL suspension Take 15 mLs by mouth every 6 (six) hours as needed for Indigestion  . apixaban (ELIQUIS) 5 mg tablet Take 1 tablet (5 mg total) by mouth every 12 (twelve) hours 180 tablet 3  . aspirin 81 MG EC tablet Take 81 mg by mouth once daily  . atorvastatin (LIPITOR) 40 MG tablet Take 1 tablet  (40 mg total) by mouth once daily For cholesterol 90 tablet 3  . collagenase (SANTYL) ointment Apply topically once daily as directed to scab on the wound 30 g 1  . diclofenac (VOLTAREN) 1 % topical gel Apply 2 g topically once daily as needed  . docusate (COLACE) 100 MG capsule Take 100 mg by mouth nightly as needed for Constipation  . famotidine (PEPCID) 20 MG tablet Take 40 mg by mouth 2 (two) times daily  . finasteride (PROSCAR) 5 mg tablet Take 1 tablet (5 mg total) by mouth once daily For prostate 90 tablet 3  . folic acid (FOLVITE) 1 MG tablet TAKE 1 TABLET BY MOUTH ONCE DAILY 90 tablet 3  . gabapentin (NEURONTIN) 300 MG capsule Take 1 capsule (300 mg total) by mouth as directed 2-3 times daily (Patient taking differently: Take 300 mg by mouth nightly ) 270 capsule 3  . insulin DETEMIR (LEVEMIR FLEXTOUCH U-100 INSULN) pen injector (concentration 100 units/mL) Inject 20 Units subcutaneously once daily 15 mL 4  . insulin LISPRO (HUMALOG KWIKPEN) pen injector (concentration 100 units/mL) Inject 1-4 Units subcutaneously 3 (three) times daily with meals According to sliding scale (Patient taking differently: Inject 2 Units subcutaneously nightly ) 45 mL 1  . ipratropium (ATROVENT) 0.02 % nebulizer solution Inhale into the lungs 4 (four) times daily  . levothyroxine (SYNTHROID) 125 MCG tablet Take 1 tablet (125 mcg total) by mouth once daily Take on an empty stomach with a glass of water at least 30-60 minutes before breakfast. 90 tablet 3  . melatonin 5 mg Tab Take 5 mg by mouth every evening  . methotrexate, PF, 25 mg/mL chemo injection INJECT SUBCUTANEOUSLY 0.6ML (15 MG) ONCE WEEKLY (DISCARD UNUSED PORTION AFTER FIRST USE) (Patient taking differently: 0.6 ml ) 24 mL 1  . metoprolol succinate (TOPROL-XL) 50 MG XL tablet Take 50 mg by mouth 2 (two) times daily  . mirtazapine (REMERON) 15 MG tablet Take 1 tablet (15 mg total) by mouth nightly 90 tablet 3  . polyethylene glycol (MIRALAX) powder Take  17 g by mouth once daily as needed for Constipation Mix in 4-8ounces of fluid prior to taking.  . saliva stimulant comb. no.3 (BIOTENE MOISTURIZING MOUTH SPRAY) spray Take by mouth as needed "Biotine"  . sennosides-docusate (SENOKOT-S) 8.6-50 mg tablet Take 1 tablet by mouth once daily as needed for Constipation  . silver sulfADIAZINE (SSD) 1 % cream Apply topically 2 (two) times daily 50 g 2  . sodium chloride 3 % nebulizer solution Inhale into the lungs 2 (two) times daily as needed  . traMADoL (ULTRAM) 50 mg tablet Take 1 tablet (50 mg total) by mouth 3 (three) times daily as needed for Pain 90 tablet 1  . BD ULTRA-FINE SHORT PEN NEEDLE 31 gauge x 5/16" needle USE 4 TIMES DAILY AS DIRECTED (Patient not taking: Reported on 09/18/2019) 360 each 3  . ONETOUCH DELICA PLUS LANCET 30 gauge Misc (Patient not taking: Reported on 09/18/2019 )  . ONETOUCH ULTRA BLUE TEST STRIP test strip Use 2 (two) times  daily (Patient not taking: Reported on 09/18/2019 ) 200 each prn   No current Epic-ordered facility-administered medications on file.   Allergies: Allergies  Allergen Reactions  . Effexor Xr [Venlafaxine] Other (See Comments)  Rapid heart rate  . Lisinopril Other (See Comments)  hyperkalemia  . Other Other (See Comments)  Jitters/nervous  . Darvocet A500 [Propoxyphene N-Acetaminophen] Other (See Comments)  Jittery/nervous  . Flovent [Fluticasone] Other (See Comments)  hoarseness  . Symbicort [Budesonide-Formoterol] Other (See Comments)  hoarseness    Body mass index is 26 kg/m.  Review of Systems: A comprehensive 14 point ROS was performed, reviewed, and the pertinent orthopaedic findings are documented in the HPI.  Vitals:  09/18/19 1429  BP: 136/74   General Physical Examination:  General/Constitutional: No apparent distress: well-nourished and well developed. Eyes: Pupils equal, round with synchronous movement. Lungs: Clear to auscultation HEENT: Normal Vascular: No edema,  swelling or tenderness, except as noted in detailed exam. Cardiac: Heart rate and rhythm is regular. Integumentary: No impressive skin lesions present, except as noted in detailed exam. Neuro/Psych: Normal mood and affect, oriented to person, place and time.  Musculoskeletal Examination: On exam, pain at L1 radiating downward. Lungs are clear. Heart rate and rhythm is normal. HEENT is normal.  Radiographs: No new imaging studies were obtained or reviewed today.  Assessment: ICD-10-CM  1. Closed wedge compression fracture of L2 vertebra, initial encounter (CMS-HCC) S32.020A   Plan: The patient has clinical findings of L2 compression fracture.  We had a lengthy discussion regarding treatment options and reviewed his prior MRI findings. I explained vertebroplasty in detail. With his history of blood clots, there is a high concern for a potential stroke. I sent a message to his primary care doctor regarding this, whether we could bridge with Lovenox. I did discuss that this is an elective procedure. We could wait a few weeks and see if his pain improves. He is having enough pain at this time to warrant the surgery, but I do understand concern about stroke with him previously having 2 strokes at the time of his carotid endarterectomy surgery.   His wife will call back if they decide to proceed with surgery, if Dr. Harrington Challenger thinks it is a reasonable risk.  Scribe Attestation: I, Dawn Royse, am acting as scribe for [employer], MD  Reviewed paper H+P, will be scanned into chart. No changes noted.

## 2019-10-01 ENCOUNTER — Ambulatory Visit: Payer: Medicare Other | Admitting: Urgent Care

## 2019-10-01 ENCOUNTER — Ambulatory Visit
Admission: RE | Admit: 2019-10-01 | Discharge: 2019-10-01 | Disposition: A | Discharge status: Home / Self Care | Payer: Medicare Other | Attending: Orthopedic Surgery | Admitting: Orthopedic Surgery

## 2019-10-01 ENCOUNTER — Encounter: Payer: Self-pay | Admitting: Orthopedic Surgery

## 2019-10-01 ENCOUNTER — Ambulatory Visit: Payer: Medicare Other

## 2019-10-01 ENCOUNTER — Encounter
Admission: RE | Disposition: A | Discharge status: Home / Self Care | Payer: Self-pay | Source: Home / Self Care | Attending: Orthopedic Surgery

## 2019-10-01 DIAGNOSIS — K219 Gastro-esophageal reflux disease without esophagitis: Secondary | ICD-10-CM | POA: Diagnosis not present

## 2019-10-01 DIAGNOSIS — I2510 Atherosclerotic heart disease of native coronary artery without angina pectoris: Secondary | ICD-10-CM | POA: Insufficient documentation

## 2019-10-01 DIAGNOSIS — M4850XA Collapsed vertebra, not elsewhere classified, site unspecified, initial encounter for fracture: Secondary | ICD-10-CM

## 2019-10-01 DIAGNOSIS — G4730 Sleep apnea, unspecified: Secondary | ICD-10-CM | POA: Diagnosis not present

## 2019-10-01 DIAGNOSIS — Z888 Allergy status to other drugs, medicaments and biological substances status: Secondary | ICD-10-CM | POA: Diagnosis not present

## 2019-10-01 DIAGNOSIS — I739 Peripheral vascular disease, unspecified: Secondary | ICD-10-CM | POA: Insufficient documentation

## 2019-10-01 DIAGNOSIS — Z79899 Other long term (current) drug therapy: Secondary | ICD-10-CM | POA: Diagnosis not present

## 2019-10-01 DIAGNOSIS — Z885 Allergy status to narcotic agent status: Secondary | ICD-10-CM | POA: Insufficient documentation

## 2019-10-01 DIAGNOSIS — Z7982 Long term (current) use of aspirin: Secondary | ICD-10-CM | POA: Insufficient documentation

## 2019-10-01 DIAGNOSIS — Z79890 Hormone replacement therapy (postmenopausal): Secondary | ICD-10-CM | POA: Insufficient documentation

## 2019-10-01 DIAGNOSIS — M8088XA Other osteoporosis with current pathological fracture, vertebra(e), initial encounter for fracture: Secondary | ICD-10-CM | POA: Diagnosis present

## 2019-10-01 DIAGNOSIS — I69354 Hemiplegia and hemiparesis following cerebral infarction affecting left non-dominant side: Secondary | ICD-10-CM | POA: Insufficient documentation

## 2019-10-01 DIAGNOSIS — E7800 Pure hypercholesterolemia, unspecified: Secondary | ICD-10-CM | POA: Diagnosis not present

## 2019-10-01 DIAGNOSIS — Z955 Presence of coronary angioplasty implant and graft: Secondary | ICD-10-CM | POA: Diagnosis not present

## 2019-10-01 DIAGNOSIS — Z7901 Long term (current) use of anticoagulants: Secondary | ICD-10-CM | POA: Insufficient documentation

## 2019-10-01 DIAGNOSIS — I38 Endocarditis, valve unspecified: Secondary | ICD-10-CM | POA: Diagnosis not present

## 2019-10-01 DIAGNOSIS — E039 Hypothyroidism, unspecified: Secondary | ICD-10-CM | POA: Diagnosis not present

## 2019-10-01 DIAGNOSIS — I10 Essential (primary) hypertension: Secondary | ICD-10-CM | POA: Diagnosis not present

## 2019-10-01 DIAGNOSIS — E119 Type 2 diabetes mellitus without complications: Secondary | ICD-10-CM | POA: Diagnosis not present

## 2019-10-01 DIAGNOSIS — I4891 Unspecified atrial fibrillation: Secondary | ICD-10-CM | POA: Insufficient documentation

## 2019-10-01 DIAGNOSIS — Z86718 Personal history of other venous thrombosis and embolism: Secondary | ICD-10-CM | POA: Insufficient documentation

## 2019-10-01 DIAGNOSIS — M069 Rheumatoid arthritis, unspecified: Secondary | ICD-10-CM | POA: Diagnosis not present

## 2019-10-01 DIAGNOSIS — Z950 Presence of cardiac pacemaker: Secondary | ICD-10-CM | POA: Insufficient documentation

## 2019-10-01 DIAGNOSIS — J439 Emphysema, unspecified: Secondary | ICD-10-CM | POA: Insufficient documentation

## 2019-10-01 DIAGNOSIS — M5136 Other intervertebral disc degeneration, lumbar region: Secondary | ICD-10-CM | POA: Diagnosis not present

## 2019-10-01 DIAGNOSIS — Z794 Long term (current) use of insulin: Secondary | ICD-10-CM | POA: Insufficient documentation

## 2019-10-01 HISTORY — PX: KYPHOPLASTY: SHX5884

## 2019-10-01 LAB — GLUCOSE, CAPILLARY
Glucose-Capillary: 121 mg/dL — ABNORMAL HIGH (ref 70–99)
Glucose-Capillary: 132 mg/dL — ABNORMAL HIGH (ref 70–99)

## 2019-10-01 SURGERY — KYPHOPLASTY
Anesthesia: General | Site: Spine Lumbar

## 2019-10-01 MED ORDER — FENTANYL CITRATE (PF) 100 MCG/2ML IJ SOLN: 25.0000 ug | INTRAMUSCULAR | Status: DC | PRN

## 2019-10-01 MED ORDER — ORAL CARE MOUTH RINSE: 15.0000 mL | Freq: Once | OROMUCOSAL | Status: DC

## 2019-10-01 MED ORDER — ACETAMINOPHEN 500 MG PO TABS: 500.0000 mg | ORAL_TABLET | Freq: Once | ORAL | Status: AC

## 2019-10-01 MED ORDER — SODIUM CHLORIDE 0.9 % IV SOLN: INTRAVENOUS | Status: DC

## 2019-10-01 MED ORDER — IOHEXOL 180 MG/ML  SOLN
INTRAMUSCULAR | Status: DC | PRN
  Administered 2019-10-01: 40 mL

## 2019-10-01 MED ORDER — LACTATED RINGERS IV SOLN
INTRAVENOUS | Status: DC
Start: 2019-10-01 — End: 2019-10-01

## 2019-10-01 MED ORDER — ACETAMINOPHEN 10 MG/ML IV SOLN
INTRAVENOUS | Status: AC
  Filled 2019-10-01: qty 100

## 2019-10-01 MED ORDER — ACETAMINOPHEN 500 MG PO TABS
ORAL_TABLET | ORAL | Status: AC
  Administered 2019-10-01: 500 mg via ORAL
  Filled 2019-10-01: qty 1

## 2019-10-01 MED ORDER — LIDOCAINE HCL 1 % IJ SOLN
INTRAMUSCULAR | Status: DC | PRN
  Administered 2019-10-01 (×2): 10 mL

## 2019-10-01 MED ORDER — EPHEDRINE SULFATE 50 MG/ML IJ SOLN
INTRAMUSCULAR | Status: DC | PRN
  Administered 2019-10-01: 15 mg via INTRAVENOUS

## 2019-10-01 MED ORDER — ONDANSETRON HCL 4 MG PO TABS: 4.0000 mg | ORAL_TABLET | Freq: Four times a day (QID) | ORAL | Status: DC | PRN

## 2019-10-01 MED ORDER — ACETAMINOPHEN 10 MG/ML IV SOLN
INTRAVENOUS | Status: DC | PRN
Start: 2019-10-01 — End: 2019-10-01
  Administered 2019-10-01: 1000 mg via INTRAVENOUS

## 2019-10-01 MED ORDER — BUPIVACAINE-EPINEPHRINE (PF) 0.5% -1:200000 IJ SOLN
INTRAMUSCULAR | Status: DC | PRN
  Administered 2019-10-01: 10 mL via PERINEURAL

## 2019-10-01 MED ORDER — DEXMEDETOMIDINE HCL 200 MCG/2ML IV SOLN
INTRAVENOUS | Status: DC | PRN
  Administered 2019-10-01: 20 ug via INTRAVENOUS

## 2019-10-01 MED ORDER — LIDOCAINE HCL (CARDIAC) PF 100 MG/5ML IV SOSY
PREFILLED_SYRINGE | INTRAVENOUS | Status: DC | PRN
  Administered 2019-10-01: 100 mg via INTRAVENOUS

## 2019-10-01 MED ORDER — CEFAZOLIN SODIUM-DEXTROSE 2-4 GM/100ML-% IV SOLN
INTRAVENOUS | Status: AC
  Filled 2019-10-01: qty 100

## 2019-10-01 MED ORDER — ONDANSETRON HCL 4 MG/2ML IJ SOLN: 4.0000 mg | Freq: Four times a day (QID) | INTRAMUSCULAR | Status: DC | PRN

## 2019-10-01 MED ORDER — CHLORHEXIDINE GLUCONATE 0.12 % MT SOLN: 15.0000 mL | Freq: Once | OROMUCOSAL | Status: DC

## 2019-10-01 MED ORDER — PROPOFOL 500 MG/50ML IV EMUL
INTRAVENOUS | Status: DC | PRN
  Administered 2019-10-01: 50 ug/kg/min via INTRAVENOUS

## 2019-10-01 MED ORDER — METOCLOPRAMIDE HCL 5 MG/ML IJ SOLN: 5.0000 mg | Freq: Three times a day (TID) | INTRAMUSCULAR | Status: DC | PRN

## 2019-10-01 MED ORDER — KETAMINE HCL 10 MG/ML IJ SOLN
INTRAMUSCULAR | Status: DC | PRN
  Administered 2019-10-01: 30 mg via INTRAVENOUS

## 2019-10-01 MED ORDER — CHLORHEXIDINE GLUCONATE 0.12 % MT SOLN
OROMUCOSAL | Status: AC
  Filled 2019-10-01: qty 15

## 2019-10-01 MED ORDER — CEFAZOLIN SODIUM-DEXTROSE 2-4 GM/100ML-% IV SOLN
2.0000 g | INTRAVENOUS | Status: AC
  Administered 2019-10-01: 2 g via INTRAVENOUS

## 2019-10-01 MED ORDER — TRAMADOL HCL 50 MG PO TABS: 50.0000 mg | ORAL_TABLET | Freq: Three times a day (TID) | ORAL | 1 refills | Status: AC | PRN

## 2019-10-01 MED ORDER — BUPIVACAINE-EPINEPHRINE (PF) 0.25% -1:200000 IJ SOLN
INTRAMUSCULAR | Status: AC
  Filled 2019-10-01: qty 30

## 2019-10-01 MED ORDER — METOCLOPRAMIDE HCL 10 MG PO TABS: 5.0000 mg | ORAL_TABLET | Freq: Three times a day (TID) | ORAL | Status: DC | PRN

## 2019-10-01 MED ORDER — PROPOFOL 500 MG/50ML IV EMUL
INTRAVENOUS | Status: AC
  Filled 2019-10-01: qty 50

## 2019-10-01 SURGICAL SUPPLY — 24 items
ADH SKN CLS APL DERMABOND .7 (GAUZE/BANDAGES/DRESSINGS) ×1
BNDG ADH 2 X3.75 FABRIC TAN LF (GAUZE/BANDAGES/DRESSINGS) ×4 IMPLANT
BNDG ADH XL 3.75X2 STRCH LF (GAUZE/BANDAGES/DRESSINGS) ×2
CEMENT KYPHON CX01A KIT/MIXER (Cement) ×3 IMPLANT
COVER WAND RF STERILE (DRAPES) ×3 IMPLANT
DERMABOND ADVANCED (GAUZE/BANDAGES/DRESSINGS) ×2
DERMABOND ADVANCED .7 DNX12 (GAUZE/BANDAGES/DRESSINGS) ×1 IMPLANT
DEVICE BIOPSY BONE KYPHX (INSTRUMENTS) ×3 IMPLANT
DRAPE C-ARM XRAY 36X54 (DRAPES) ×3 IMPLANT
DURAPREP 26ML APPLICATOR (WOUND CARE) ×3 IMPLANT
FEE RENTAL RFA GENERATOR (MISCELLANEOUS) IMPLANT
GLOVE SURG SYN 9.0  PF PI (GLOVE) ×2
GLOVE SURG SYN 9.0 PF PI (GLOVE) ×1 IMPLANT
GOWN SRG 2XL LVL 4 RGLN SLV (GOWNS) ×1 IMPLANT
GOWN STRL NON-REIN 2XL LVL4 (GOWNS) ×3
GOWN STRL REUS W/ TWL LRG LVL3 (GOWN DISPOSABLE) ×1 IMPLANT
GOWN STRL REUS W/TWL LRG LVL3 (GOWN DISPOSABLE) ×3
PACK KYPHOPLASTY (MISCELLANEOUS) ×3 IMPLANT
RENTAL RFA  GENERATOR (MISCELLANEOUS)
RENTAL RFA GENERATOR (MISCELLANEOUS) IMPLANT
STRAP SAFETY 5IN WIDE (MISCELLANEOUS) ×3 IMPLANT
SWABSTK COMLB BENZOIN TINCTURE (MISCELLANEOUS) ×3 IMPLANT
TRAY KYPHOPAK 15/3 EXPRESS 1ST (MISCELLANEOUS) ×3 IMPLANT
TRAY KYPHOPAK 20/3 EXPRESS 1ST (MISCELLANEOUS) ×1 IMPLANT

## 2019-10-01 NOTE — Discharge Instructions (Signed)
AMBULATORY SURGERY  DISCHARGE INSTRUCTIONS   1) The drugs that you were given will stay in your system until tomorrow so for the next 24 hours you should not:  A) Drive an automobile B) Make any legal decisions C) Drink any alcoholic beverage   2) You may resume regular meals tomorrow.  Today it is better to start with liquids and gradually work up to solid foods.  You may eat anything you prefer, but it is better to start with liquids, then soup and crackers, and gradually work up to solid foods.   3) Please notify your doctor immediately if you have any unusual bleeding, trouble breathing, redness and pain at the surgery site, drainage, fever, or pain not relieved by medication. 4)   5) Your post-operative visit with Dr.                                     is: Date:                        Time:    Please call to schedule your post-operative visit.  6) Additional Instructions:    Take it easy today and tomorrow without a lifting or bending at the waist. Try to walk is much as you can. Band-Aids come off on Thursday then okay to shower. Pain medicine as directed. Call office if having problems. [phone] 

## 2019-10-01 NOTE — Transfer of Care (Signed)
Immediate Anesthesia Transfer of Care Note  Patient: Oscar Brock  Procedure(s) Performed: L2 KYPHOPLASTY (N/A Spine Lumbar)  Patient Location: PACU  Anesthesia Type:General  Level of Consciousness: drowsy  Airway & Oxygen Therapy: Patient Spontanous Breathing and Patient connected to face mask oxygen  Post-op Assessment: Report given to RN and Post -op Vital signs reviewed and stable  Post vital signs: Reviewed and stable  Last Vitals:  Vitals Value Taken Time  BP 100/60 10/01/19 1737  Temp    Pulse 60 10/01/19 1738  Resp 14 10/01/19 1738  SpO2 99 % 10/01/19 1738  Vitals shown include unvalidated device data.  Last Pain:  Vitals:   10/01/19 1425  TempSrc: Tympanic  PainSc: 8          Complications: No complications documented.

## 2019-10-01 NOTE — Anesthesia Postprocedure Evaluation (Signed)
Anesthesia Post Note  Patient: Oscar Brock  Procedure(s) Performed: L2 KYPHOPLASTY (N/A Spine Lumbar)  Patient location during evaluation: PACU Anesthesia Type: General Level of consciousness: awake and alert Pain management: pain level controlled Vital Signs Assessment: post-procedure vital signs reviewed and stable Respiratory status: spontaneous breathing, nonlabored ventilation, respiratory function stable and patient connected to nasal cannula oxygen Cardiovascular status: blood pressure returned to baseline and stable Postop Assessment: no apparent nausea or vomiting Anesthetic complications: no   No complications documented.   Last Vitals:  Vitals:   10/01/19 1829 10/01/19 1839  BP: (!) 106/58 (!) 101/57  Pulse: 74 67  Resp: 15 15  Temp:    SpO2: 100% 100%    Last Pain:  Vitals:   10/01/19 1839  TempSrc:   PainSc: 0-No pain                 Corinda Gubler

## 2019-10-01 NOTE — Op Note (Signed)
Date 10/01/19  Time 17:30   PATIENT: Oscar Brock   PRE-OPERATIVE DIAGNOSIS:  closed wedge compression fracture of L2   POST-OPERATIVE DIAGNOSIS:  closed wedge compression fracture of L2   PROCEDURE:  Procedure(s): KYPHOPLASTY L2  SURGEON: Laurene Footman, MD   ASSISTANTS: None   ANESTHESIA:   local and MAC   EBL:  No intake/output data recorded.   BLOOD ADMINISTERED:none   DRAINS: none    LOCAL MEDICATIONS USED:  MARCAINE    and XYLOCAINE    SPECIMEN:   L2 vertebral body biopsy   DISPOSITION OF SPECIMEN:  Pathology   COUNTS:  YES   TOURNIQUET:  * No tourniquets in log *   IMPLANTS: Bone cement   DICTATION: .Dragon Dictation  patient was brought to the operating room and after adequate anesthesia was obtained the patient was placed prone.  C arm was brought in in good visualization of the affected level obtained on both AP and lateral projections.  After patient identification and timeout procedures were completed, local anesthetic was infiltrated with 10 cc 1% Xylocaine infiltrated subcutaneously.  This is done the area on the each side of the planned approach.  The back was then prepped and draped in the usual sterile manner and repeat timeout procedure carried out.  A spinal needle was brought down to the pedicle on the each side of  L2 and a 50-50 mix of 1% Xylocaine half percent Sensorcaine with epinephrine total of 20 cc injected on each side.  After allowing this to set a small incision was made and the trocar was advanced into the vertebral body in an extrapedicular fashion.  Biopsy was obtained Drilling was carried out balloon inserted with inflation to  1 cc on the right and 2-1/2 cc on the left.  When the cement was appropriate consistency to cc were injected on the right and 2-1/2 cc on the left into the vertebral body without extravasation, good fill superior to inferior endplates and from right to left sides along the inferior endplate.  After the cement had set the  trochar was removed and permanent C-arm views obtained.  The wound was closed with Dermabond followed by [place]:  Discharged home after recovery room   PATIENT DISPOSITION:  PACU - hemodynamically stable.

## 2019-10-01 NOTE — Anesthesia Preprocedure Evaluation (Signed)
Anesthesia Evaluation  Patient identified by MRN, date of birth, ID band Patient awake    Reviewed: Allergy & Precautions, NPO status , Patient's Chart, lab work & pertinent test results  History of Anesthesia Complications Negative for: history of anesthetic complications  Airway Mallampati: II  TM Distance: >3 FB Neck ROM: Full    Dental  (+) Upper Dentures, Lower Dentures, Dental Advidsory Given   Pulmonary shortness of breath and with exertion, asthma , sleep apnea and Continuous Positive Airway Pressure Ventilation , COPD,  COPD inhaler, neg recent URI, former smoker,    breath sounds clear to auscultation- rhonchi (-) wheezing      Cardiovascular hypertension, + CAD (hx of balloon angioplasty, no stents) and + Peripheral Vascular Disease  (-) Cardiac Stents and (-) CABG + dysrhythmias Atrial Fibrillation + pacemaker  Rhythm:Regular Rate:Normal - Systolic murmurs and - Diastolic murmurs    Neuro/Psych neg Seizures PSYCHIATRIC DISORDERS Anxiety Depression CVA (L sided paralysis), Residual Symptoms    GI/Hepatic Neg liver ROS, GERD  ,  Endo/Other  diabetes, Insulin DependentHypothyroidism   Renal/GU Renal disease (hx of nephrolithiasis)     Musculoskeletal  (+) Arthritis ,   Abdominal (+) - obese,   Peds  Hematology negative hematology ROS (+)   Anesthesia Other Findings Past Medical History: No date: Anxiety No date: Arthritis No date: Atrial fibrillation (HCC) 02/13/2017: Carotid stenosis, symptomatic, with infarction (HCC) No date: COPD (chronic obstructive pulmonary disease) (HCC) No date: Diabetes mellitus without complication (HCC) No date: GERD (gastroesophageal reflux disease) 04/11/2017: HTN (hypertension) 02/18/2009: Hyperlipidemia No date: Hypertension No date: Kidney stones 04/25/2017: Left hemiplegia (HCC) No date: Stroke (HCC) 07/12/2016: Tremor   Reproductive/Obstetrics                              Anesthesia Physical  Anesthesia Plan  ASA: III  Anesthesia Plan: General   Post-op Pain Management:    Induction: Intravenous  PONV Risk Score and Plan: 1 and Propofol infusion and TIVA  Airway Management Planned: Natural Airway  Additional Equipment:   Intra-op Plan:   Post-operative Plan:   Informed Consent: I have reviewed the patients History and Physical, chart, labs and discussed the procedure including the risks, benefits and alternatives for the proposed anesthesia with the patient or authorized representative who has indicated his/her understanding and acceptance.     Dental advisory given  Plan Discussed with: CRNA and Anesthesiologist  Anesthesia Plan Comments:         Anesthesia Quick Evaluation

## 2019-10-03 LAB — SURGICAL PATHOLOGY

## 2019-10-05 DIAGNOSIS — D6869 Other thrombophilia: Secondary | ICD-10-CM | POA: Insufficient documentation

## 2019-10-08 ENCOUNTER — Encounter: Payer: Self-pay | Admitting: Orthopedic Surgery

## 2019-11-04 ENCOUNTER — Telehealth (INDEPENDENT_AMBULATORY_CARE_PROVIDER_SITE_OTHER): Payer: Medicare Other | Admitting: Gastroenterology

## 2019-11-04 DIAGNOSIS — K5903 Drug induced constipation: Secondary | ICD-10-CM | POA: Diagnosis not present

## 2019-11-04 DIAGNOSIS — K3184 Gastroparesis: Secondary | ICD-10-CM

## 2019-11-04 NOTE — Progress Notes (Signed)
[name] , MD [address]  [address]  [place], [place] [address]  Main: [phone]  Fax: [phone]   Primary Care Physician: Mickey Farber, MD  Virtual Visit via Telephone Note  I connected with patient on 11/04/19 at  2:30 PM EDT by telephone and verified that I am speaking with the correct person using two identifiers.   I discussed the limitations, risks, security and privacy concerns of performing an evaluation and management service by telephone and the availability of in person appointments. I also discussed with the patient that there may be a patient responsible charge related to this service. The patient expressed understanding and agreed to proceed.  Location of Patient: Home Location of Provider: Home Persons involved: Patient and provider only   History of Present Illness: Chief Complaint  Patient presents with  . Follow-up    HPI: Oscar Brock is a 79 y.o. male   Summary of history :   Recent ER visit for abdominal painHe had a CT angiogram of the chest abdomen and pelvis to rule out dissection and was found to have persistent retroperitoneal and mesenteric adenopathy this is of unknown clinical significance lymphoma not excluded. Multiple stones in the urinary bladder.On 05/15/2019 he had a CT scan of the abdomen showed mildly thickened appearance of the gastroesophageal junction which may be related to underdistention. Mild haziness in the mesentery with multiple mildly enlarged lymph nodes.  Labs on 05/20/2019 for a lipase was normal, troponin normal, CBC normal, CMP elevated glucose low chloride of 97 but creatinine of 0.84.Abdominal pain ongoing for 4 weeks. Lower abdomen in nature. Not had a bowel movement for 4 days at least. On tramadol as well as laxatives. He had  been on OxyContin in the past. Likely contributed to his constipation. Denies any fevers but has had occasional night sweats. No loss of weight. Denies any dysphagia but  has had heartburn. Takes Pepcid daily. Admit to the hospital in May 2021 with tachycardia. 06/11/2019 EGD large amount of food seen in the stomach otherwise evaluation was normal Repeat CT scan on 08/05/2019 showed no abnormalities and plan was to repeat imaging in 1 years time. Moderate-sized hiatal hernia also noted.  Compression fracture of L2 noted  Interval history 08/19/2019-11/03/2089   Spoke to patient and his daughter.  Not having much nausea.  Limiting the use of narcotics.  Not having any significant constipation.  Takes Mylanta and Tums at times for epigastric discomfort.   Current Outpatient Medications  Medication Sig Dispense Refill  . acetaminophen (TYLENOL) 500 MG tablet Take 500-1,000 mg by mouth 4 (four) times daily as needed for mild pain or fever.     Marland Kitchen albuterol (PROVENTIL) (2.5 MG/3ML) 0.083% nebulizer solution Take 2.5 mg by nebulization every 6 (six) hours as needed for wheezing or shortness of breath.     Marland Kitchen albuterol (VENTOLIN HFA) 108 (90 Base) MCG/ACT inhaler Inhale 2 puffs into the lungs every 4 (four) hours as needed for wheezing or shortness of breath.    . ALPRAZolam (XANAX) 0.25 MG tablet Take 0.25 mg by mouth 3 (three) times daily as needed for anxiety.     Marland Kitchen alum & mag hydroxide-simeth (MAALOX/MYLANTA) 200-200-20 MG/5ML suspension Take 15 mLs by mouth every 4 (four) hours as needed for indigestion or heartburn.    Marland Kitchen apixaban (ELIQUIS) 5 MG TABS tablet Take 5 mg by mouth 2 (two) times daily.    . Artificial Saliva (BIOTENE DRY MOUTH MOISTURIZING) SOLN Take 1 spray  by mouth every 2 (two) hours as needed (dry mouth).     Marland Kitchen aspirin EC 81 MG tablet Take 81 mg by mouth daily.     Marland Kitchen atorvastatin (LIPITOR) 40 MG tablet Take 40 mg by mouth daily.     Marland Kitchen bismuth subsalicylate (KAOPECTATE) 262 MG/15ML suspension Take 30 mLs by mouth every 6 (six) hours as needed for indigestion. kaopectate    . cholecalciferol (VITAMIN D3) 25 MCG (1000 UNIT) tablet Take 1,000 Units by  mouth daily.    . diclofenac Sodium (VOLTAREN) 1 % GEL Apply 2 g topically daily as needed (Shoulder pain).     Marland Kitchen docusate sodium (COLACE) 100 MG capsule Take 100 mg by mouth at bedtime.     . famotidine (PEPCID) 20 MG tablet Take 20 mg by mouth 2 (two) times daily.    . finasteride (PROSCAR) 5 MG tablet Take 5 mg by mouth daily.     . folic acid (FOLVITE) 1 MG tablet Take 1 mg by mouth daily.    Marland Kitchen gabapentin (NEURONTIN) 300 MG capsule Take 300 mg by mouth at bedtime.     Marland Kitchen HUMALOG KWIKPEN 100 UNIT/ML KiwkPen Inject 1-3 Units into the skin at bedtime as needed (high blood sugar). Sliding scale    . Insulin Detemir (LEVEMIR FLEXTOUCH) 100 UNIT/ML Pen Inject 20 Units into the skin daily.     Marland Kitchen Ketotifen Fumarate (ITCHY EYE DROPS OP) Place 1 drop into both eyes daily as needed (burning / itching eyes).    Marland Kitchen levothyroxine (SYNTHROID) 125 MCG tablet Take 125 mcg by mouth daily.     . Liniments (BLUE-EMU SUPER STRENGTH) CREA Apply 1 application topically at bedtime. To feet    . magnesium hydroxide (MILK OF MAGNESIA) 400 MG/5ML suspension Take 30 mLs by mouth daily as needed for mild constipation.    . melatonin 5 MG TABS Take 5 mg by mouth at bedtime as needed (sleep).     . Methotrexate Sodium (METHOTREXATE, PF,) 50 MG/2ML injection Inject 15 mg into the vein every Sunday.    . metoprolol succinate (TOPROL-XL) 50 MG 24 hr tablet Take 50 mg by mouth 2 (two) times daily. Take with or immediately following a meal.    . mirtazapine (REMERON) 15 MG tablet Take 30 mg by mouth at bedtime.     . polyethylene glycol (MIRALAX / GLYCOLAX) 17 g packet Take 17 g by mouth daily as needed for mild constipation.    . Sennosides 25 MG TABS Take 50 mg by mouth at bedtime.    . traMADol (ULTRAM) 50 MG tablet Take 1 tablet (50 mg total) by mouth 3 (three) times daily as needed for moderate pain or severe pain. 30 tablet 1  . trolamine salicylate (ASPERCREME) 10 % cream Apply 1 application topically as needed for muscle  pain.     No current facility-administered medications for this visit.    Allergies as of 11/04/2019 - Review Complete 11/04/2019  Allergen Reaction Noted  . Effexor xr [venlafaxine hcl er] Shortness Of Breath 05/22/2016  . Lisinopril  05/22/2016  . Oxycodone-acetaminophen Other (See Comments) 02/13/2017  . Adhesive [tape] Rash 09/27/2019  . Budesonide-formoterol fumarate  02/21/2014  . Fluticasone  02/21/2014  . Propoxyphene Other (See Comments) 07/13/2012    Review of Systems:    All systems reviewed and negative except where noted in HPI.   Observations/Objective:  Labs: CMP     Component Value Date/Time   NA 139 08/13/2019 1051   NA 143 09/20/2011  0433   K 4.0 08/13/2019 1051   K 4.4 09/20/2011 0433   CL 102 08/13/2019 1051   CL 108 (H) 09/20/2011 0433   CO2 26 08/13/2019 1051   CO2 25 09/20/2011 0433   GLUCOSE 137 (H) 08/13/2019 1051   GLUCOSE 106 (H) 09/20/2011 0433   BUN 11 08/13/2019 1051   BUN 13 09/20/2011 0433   CREATININE 0.82 08/13/2019 1051   CREATININE 0.87 09/20/2011 0433   CALCIUM 8.7 (L) 08/13/2019 1051   CALCIUM 8.6 09/20/2011 0433   PROT 7.2 08/13/2019 1051   PROT 7.4 09/19/2011 1405   ALBUMIN 3.7 08/13/2019 1051   ALBUMIN 3.9 09/19/2011 1405   AST 40 08/13/2019 1051   AST 29 09/19/2011 1405   ALT 38 08/13/2019 1051   ALT 41 09/19/2011 1405   ALKPHOS 88 08/13/2019 1051   ALKPHOS 69 09/19/2011 1405   BILITOT 0.6 08/13/2019 1051   BILITOT 0.9 09/19/2011 1405   GFRNONAA >60 08/13/2019 1051   GFRNONAA >60 09/20/2011 0433   GFRAA >60 08/13/2019 1051   GFRAA >60 09/20/2011 0433   Lab Results  Component Value Date   WBC 9.9 08/13/2019   HGB 13.3 08/13/2019   HCT 40.1 08/13/2019   MCV 96.9 08/13/2019   PLT 273 08/13/2019    Imaging Studies: No results found.  Assessment and Plan:   Ezio Wieck is a 79 y.o. y/o male here to follow up  to follow-up for abdominal pain.  Earlier this yearCT scan of the abdomen showed mesenteric  lymphadenopathy as well as thickening of his esophagus. Has also had zoster affecting his abdomen. History of constipation, has been on tramadol recent EGD showed food in the stomach digestive of possible gastroparesis.  Symptoms reasonably well controlled with a gastroparesis diet and limiting the use of narcotics.  He has had extensive orthopedic treatment.  But is well aware that excess narcotics can worsen his GI symptoms.  He has found a reasonable balance at this point of time.  Using Mylanta for epigastric discomfort.  I presume he is having significant acid reflux due to the gastroparesis and is requiring as needed medication.  Plan 1.  Continue gastroparesis diet, use of Mylanta.  If having constipation can take over-the-counter medications including MiraLAX or senna and call me if not any better.  2.  Repeat CT scan of the abdomen in May 2022 to check for lymphadenopathy which had resolved on his last CT scan  Follow-up as needed  I discussed the assessment and treatment plan with the patient. The patient was provided an opportunity to ask questions and all were answered. The patient agreed with the plan and demonstrated an understanding of the instructions.   The patient was advised to call back or seek an in-person evaluation if the symptoms worsen or if the condition fails to improve as anticipated.  I provided 11 minutes of non-face-to-face time during this encounter.  Dr [name] MD,MRCP [facility]) Gastroenterology/Hepatology Pager: [phone]   Speech recognition software was used to dictate this note.

## 2019-11-06 ENCOUNTER — Ambulatory Visit (INDEPENDENT_AMBULATORY_CARE_PROVIDER_SITE_OTHER): Payer: Medicare Other | Admitting: Nurse Practitioner

## 2019-11-06 ENCOUNTER — Encounter (INDEPENDENT_AMBULATORY_CARE_PROVIDER_SITE_OTHER): Payer: Medicare Other

## 2019-11-26 ENCOUNTER — Telehealth: Payer: Self-pay | Admitting: Primary Care

## 2019-11-26 NOTE — Telephone Encounter (Signed)
Spoke with patient's wife, Nile Dear, regarding Palliative services and all questions were answered and she was in agreement with starting Palliative services.  I have scheduled an In-person Consult for 11/28/19 @ 8:30 AM.

## 2019-11-28 ENCOUNTER — Other Ambulatory Visit: Payer: Self-pay

## 2019-11-28 ENCOUNTER — Other Ambulatory Visit: Payer: Medicare Other | Admitting: Primary Care

## 2019-11-28 DIAGNOSIS — J449 Chronic obstructive pulmonary disease, unspecified: Secondary | ICD-10-CM

## 2019-11-28 DIAGNOSIS — Z515 Encounter for palliative care: Secondary | ICD-10-CM

## 2019-11-28 NOTE — Progress Notes (Signed)
[facility] Consult Note Telephone: [phone]  Fax: [phone]  PATIENT NAME: Oscar Brock [address] [place] [address] [phone] (home)  DOB: 01-18-1941 MRN: [id number]  PRIMARY CARE PROVIDER:    Ezequiel Kayser, MD,  [place] [address] [phone]  REFERRING PROVIDER:   Ezequiel Kayser, MD [place] [facility],  [place] [address] [phone]  RESPONSIBLE PARTY:   Extended Emergency Contact Information Primary Emergency Contact: [place], [place] Address: [address]          [place], [place] [address] Johnnette Litter of [place] Phone: [phone] Mobile Phone: [phone] Relation: Spouse Secondary Emergency Contact: QUE, MENEELY Mobile Phone: [phone] Relation: None  I met with the patient and [place] family in their home. 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 He is currently bedbound. One  impetus for calling palliative is that they can no longer get him out to appointments and are debating how to proceed. He goes out for a scheduled reclast for the osteoporosis but can no longer travel. He had a CVA three years ago that left him with left sided weakness, hearing and sight Loss.    ASSESSMENT AND RECOMMENDATIONS:   1. Advance Care Planning/Goals of Care: Goals include to maximize quality of life and symptom management. Our advance care planning conversation included a discussion about:     The value and importance of advance care planning   Experiences with loved ones who have been seriously ill or have died   Exploration of personal, cultural or spiritual beliefs that might influence medical decisions   Exploration of goals of care in the event  of a sudden injury or illness   Identification and preparation of a healthcare agent   Review and updating or creation of an  advance directive document .   Has  A DNR, and left MOST for them to review. Goals of care include deescalation of interventions, de prescribing and comfort measures.  Goals of care. Extensive discussion with family regarding patient status, history and goals. They very much are interested in symptom management and quality of life as a goal. We discussed the onerous process of transporting him to appointments given his left-sided weakness as well as shoulder sub luxation and pain. We discussed futility of treatment concept and syndrome of disease decline. They stated that they wanted to focus on keeping him in place, treating him in place, and addressing symptoms in a conservative manner. We did discuss the hospice program which they were interested in and understand that this is a supportive program that will address pain as well as caregiving strain, constipation, and enable existential review.  2. Symptom Management:   Nutrition: His health weight was 220 lbs and he is likely 185 lbs at this writing. This is a 15% loss in 3 years and 11% loss in 3 months. He has not had a reliable weight in several months but family reports subjective loss. Last recorded wt 190 lbs in 7/21. His appetite is consistent with weight loss and food intake is around 25%. His fluid intake is less than 700 mls/ daily. BMI was 27.5 when healthy, now 23.1. Edema at times on Left side confounds weight  Function: Was able to be transferred with sit to  stand device to chair to go out in a [place] over the summer. Could tolerate sitting up and travelling, 50%. Now has fatigue, weakness and pain such that he is bedbound now, with PPS of 30%  Pain his pain is constant and widespread, due to immobility, recent subluxation injury of left shoulder, allodynia from CVA and rheumatoid arthritis and diabetic  neuropathy. He is currently  managed with gabapentin, which  gives him increased confusion and disorientation, and PRN tramadol. We discussed his pain control as being central to his quality of life and I would recommend much more aggressive pain management in light of goals of care, which include optimizing symptoms. Recommend scheduling tramadol 50 mg qid or considering morphine dosing.  Dyspnea: Standing COPD. Manages with nebulizers but often sob. Moves air well today.H/o OSA which family does observe. Refuses CPAP which he has. Constipation he has a long-standing gastroparesis and constipation history. Review of his current bowel protocol reveals prn use of MiraLAX, Colace, and senna. Education provided that he needed a daily protocol of an aggressive management especially if we increase narcotics. I recommend a daily dose of MiraLAX, not PRN.  He would also benefit from Senna, two tablets twice a day. I discussed titrating all of these bowel preparations to the effect of a soft formed stool a  minimum every other day.   Medication management: Family is very much focused on reducing pill burden and focusing on symptom management only. That said, he does have many medicines that are addressing symptom management. However I did recommend that they consider to discontinue supplements, statin and perhaps the methotrexate depending on how much benefit he is  deriving.   Depression currently treating this with Xanax and narcotic to induce sleep. He may benefit from an SSRI which could improve his pain control, appetite and mood potentially. He did bring up his depression as a bothersome symptom.   Wound management Family has reported skin  breakdown; he has a pressure injury, stage one which includes a 8 to 10 cm area on his sacrum, with stage 2 break down in the center, approx. 1 cm round. They are currently using Aspercreme. I instructed them to obtain foam dressing  with silicone border to provide padding  protection as well as covering of the wound. Family brought to my attention a new wound under his right nares of about 1-2 weeks duration. It has a pus filled center. The history of this wound is that he had an irritation/pimple which they thought was an ingrown hair. His wife attempted to clip the mustache and may have nicked it. Differential includes squamous cell carcinoma which he has a history of but upon examination this area looks to be more infectious. I've called in doxycycline and mupirocin to treat for possible MRSA. No allergies to antibiotics were noted on his chart or reported. Instructed to call me if not resolved or report to PCP.  3. Follow up Palliative Care Visit: Recommend hospice referral.  4. Family /Caregiver/Community Supports:  [place], has family members to help with care, caregiver strain due to increasing needs.   5. Cognitive / Functional decline: A and O x 2-3. Bedbound, painful with movement due to CVA effects and L shoulder injury. Dependent in all adls and iadls.  I spent 175 minutes providing this consultation,  from 0915 to 1210. More than 50% of the time in this consultation was spent coordinating communication.   CHIEF COMPLAINT: pain, skin break down, immobility, wt  loss, constipation  HISTORY OF PRESENT ILLNESS:  Oscar Brock is a 79 y.o. year old male with multiple medical problems including h/o CAD x 30 years, a fib, CVA in 2018 with L sensory and function loss, pacemaker, COPD, DM,  Gastroparesis, neuropathies, constipation. Palliative Care was asked to follow this patient by consultation request of Ezequiel Kayser, MD to help address advance care planning and goals of care. This is the initial visit.  CODE STATUS: DNR  PPS: 30%  HOSPICE ELIGIBILITY/DIAGNOSIS: yes/CAD, COPD, abnormal weight loss, concordant goals of care.  PAST MEDICAL HISTORY:  Active Ambulatory Problems    Diagnosis Date Noted  . HTN (hypertension)  04/11/2017  . Diabetes ([place]) 04/11/2017  . History of stroke 04/11/2017  . COPD (chronic obstructive pulmonary disease) ([place]) 04/11/2017  . Sinoatrial node dysfunction ([place]) 04/11/2017  . Hypoglycemia 04/11/2017  . Depression, major, single episode, moderate ([place]) 04/11/2017  . Coronary artery disease involving native coronary artery of native heart without angina pectoris 02/18/2009  . Benign prostatic hyperplasia 04/24/2017  . Carotid stenosis, symptomatic, with infarction ([place]) 02/13/2017  . Chronic anticoagulation 01/24/2014  . Chronic idiopathic constipation 10/10/2016  . Degenerative arthritis 04/24/2017  . Diabetic polyneuropathy ([place]) 10/10/2016  . Difficulty walking 09/23/2016  . Gastro-esophageal reflux disease without esophagitis 02/18/2009  . High risk medication use 10/16/2013  . Hyperlipidemia 02/18/2009  . 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 Pure hypercholesterolemia 10/13/2013  . Rheumatoid arthritis ([place]) 02/18/2009  . Seropositive rheumatoid arthritis of multiple joints ([place]) 10/13/2013  . Sleep apnea 04/24/2017  . Tremor 07/12/2016  . Unstable gait 07/12/2016  . Left hemiplegia ([place]) 04/25/2017  . Gastrostomy tube in place ([place]) 08/11/2017  . Mechanical complication of gastrostomy ([place])   . 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 Goals of care, counseling/discussion 06/11/2019  . Anxiety disorder 11/23/2017  . Motor restlessness 04/05/2018  . Other insomnia 02/10/2018  . Gastroparesis due to DM ([place]) 06/20/2019  . 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 Intractable back pain 08/05/2019  . Intractable low back pain 08/05/2019  . Back pain 08/06/2019  . Tachycardia   . Compression fracture of L2 lumbar vertebra ([place]) 08/26/2019  . Acquired thrombophilia ([place]) 10/05/2019  . 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 Major depressive disorder, recurrent, moderate ([place])  04/11/2017   Resolved Ambulatory Problems    Diagnosis Date Noted  . No Resolved Ambulatory Problems   Past Medical History:  Diagnosis Date  . Anxiety   . Arthritis   . Asthma   . Atrial fibrillation ([place])   . Dementia ([place])   . Diabetes mellitus without complication ([place])   . Dyspnea   . GERD (gastroesophageal reflux disease)   . Headache   . Hypertension   . Kidney stones   . Paralysis ([place])   . Presence of permanent cardiac pacemaker   . Stroke [facility])      SOCIAL HX:  Social History   Tobacco Use  . Smoking status: Former Smoker    Quit date: 1989    Years since quitting: 32.7  . Smokeless tobacco: Never Used  Substance Use Topics  . Alcohol use: No   FAMILY HX:  Family History  Problem Relation Age of Onset  . Heart failure Mother   . Clotting disorder Mother   . Heart failure Father   . Hypertension Father   . Diabetes Father   . Stroke Brother   . Lung cancer Sister  ALLERGIES:  Allergies  Allergen Reactions  . Effexor Xr [Venlafaxine Hcl Er] Shortness Of Breath  . Lisinopril     unknown  . Oxycodone-Acetaminophen Other (See Comments)    Jitters wild  . Adhesive [Tape] Rash  . Budesonide-Formoterol Fumarate     hoarseness  . Fluticasone     hoarseness  . Propoxyphene Other (See Comments)    Jittery/nervous       PERTINENT MEDICATIONS:  Outpatient Encounter Medications as of 11/28/2019  Medication Sig  . acetaminophen (TYLENOL) 500 MG tablet Take 500-1,000 mg by mouth 4 (four) times daily as needed for mild pain or fever.   Marland Kitchen albuterol (PROVENTIL) (2.5 MG/3ML) 0.083% nebulizer solution Take 2.5 mg by nebulization every 6 (six) hours as needed for wheezing or shortness of breath.   Marland Kitchen albuterol (VENTOLIN HFA) 108 (90 Base) MCG/ACT inhaler Inhale 2 puffs into the lungs every 4 (four) hours as needed for wheezing or shortness of breath.  . ALPRAZolam (XANAX) 0.25 MG tablet Take 0.25 mg by mouth 3 (three) times daily as needed for anxiety.   Marland Kitchen  alum & mag hydroxide-simeth (MAALOX/MYLANTA) 200-200-20 MG/5ML suspension Take 15 mLs by mouth every 4 (four) hours as needed for indigestion or heartburn.  Marland Kitchen apixaban (ELIQUIS) 5 MG TABS tablet Take 5 mg by mouth 2 (two) times daily.  . Artificial Saliva (BIOTENE DRY MOUTH MOISTURIZING) SOLN Take 1 spray by mouth every 2 (two) hours as needed (dry mouth).   Marland Kitchen aspirin EC 81 MG tablet Take 81 mg by mouth daily.   Marland Kitchen atorvastatin (LIPITOR) 40 MG tablet Take 40 mg by mouth daily.   Marland Kitchen bismuth subsalicylate (KAOPECTATE) 262 MG/15ML suspension Take 30 mLs by mouth every 6 (six) hours as needed for indigestion. kaopectate  . cholecalciferol (VITAMIN D3) 25 MCG (1000 UNIT) tablet Take 1,000 Units by mouth daily.  . diclofenac Sodium (VOLTAREN) 1 % GEL Apply 2 g topically daily as needed (Shoulder pain).   Marland Kitchen docusate sodium (COLACE) 100 MG capsule Take 100 mg by mouth at bedtime.   . famotidine (PEPCID) 20 MG tablet Take 20 mg by mouth 2 (two) times daily.  . finasteride (PROSCAR) 5 MG tablet Take 5 mg by mouth daily.   . folic acid (FOLVITE) 1 MG tablet Take 1 mg by mouth daily.  Marland Kitchen gabapentin (NEURONTIN) 300 MG capsule Take 300 mg by mouth at bedtime.   Marland Kitchen HUMALOG KWIKPEN 100 UNIT/ML KiwkPen Inject 1-3 Units into the skin at bedtime as needed (high blood sugar). Sliding scale  . Insulin Detemir (LEVEMIR FLEXTOUCH) 100 UNIT/ML Pen Inject 20 Units into the skin daily.   Marland Kitchen Ketotifen Fumarate (ITCHY EYE DROPS OP) Place 1 drop into both eyes daily as needed (burning / itching eyes).  Marland Kitchen levothyroxine (SYNTHROID) 125 MCG tablet Take 125 mcg by mouth daily.   . Liniments (BLUE-EMU SUPER STRENGTH) CREA Apply 1 application topically at bedtime. To feet  . magnesium hydroxide (MILK OF MAGNESIA) 400 MG/5ML suspension Take 30 mLs by mouth daily as needed for mild constipation.  . melatonin 5 MG TABS Take 5 mg by mouth at bedtime as needed (sleep).   . Methotrexate Sodium (METHOTREXATE, PF,) 50 MG/2ML injection Inject  15 mg into the vein every Sunday.  . metoprolol succinate (TOPROL-XL) 50 MG 24 hr tablet Take 50 mg by mouth 2 (two) times daily. Take with or immediately following a meal.  . mirtazapine (REMERON) 15 MG tablet Take 30 mg by mouth at bedtime.   . polyethylene glycol (  MIRALAX / GLYCOLAX) 17 g packet Take 17 g by mouth daily as needed for mild constipation.  . Sennosides 25 MG TABS Take 50 mg by mouth at bedtime.  . traMADol (ULTRAM) 50 MG tablet Take 1 tablet (50 mg total) by mouth 3 (three) times daily as needed for moderate pain or severe pain.  Marland Kitchen trolamine salicylate (ASPERCREME) 10 % cream Apply 1 application topically as needed for muscle pain.   No facility-administered encounter medications on file as of 11/28/2019.    PHYSICAL EXAM / ROS:  145/83 -66-18  Current and past weights: in health= 220 lbs, recorded as 207 lb in 7/21, 198 lbs now, 11% loss in 2 months, 15% loss over 3 years. Estimate he is 185 lbs. Now, per family. General:reports pain, frail appearing Cardiovascular:, S1S2S4, pacemaker,no chest pain reported, 1+ L pedal edema  Pulmonary: no cough, no increased SOB, room air, 97% oxygen Abdomen: Bowels sounds present and active all quadrants, abdomen soft and non tender, appetite fair=25%, endorses dysgeusia,  endorses constipation, incontinent of bowel GU: denies dysuria, incontinent of urine at times MSK:  ++ joint and ROM abnormalities,  h/o RA with pain and immobility, Osteoporotic spine fx, left shoulder is also subluxed. Left handed dominant, Bedbound Skin: wound at base of R nares, 4 mm circumference, expresses pus, painful to touch, erythema at  Boarder..  Neurological: Weakness, CVA in 2018 x 2. Chronic pain from  L sided Alodynia pain,RA pain, Sensory loss on left for sight/hearing. R arm is non mobile from CVA. Left drop foot., immobility .   Jason Coop, NP , DNP, MPH, [facility]  COVID-19 PATIENT SCREENING TOOL  Person answering questions:  ____________wife______ _____   1.  Is the patient or any family member in the home showing any signs or symptoms regarding respiratory infection?               Person with Symptom- __________NA_________________  a. Fever                                                                          Yes___ No___          ___________________  b. Shortness of breath                                                    Yes___ No___          ___________________ c. Cough/congestion                                       Yes___  No___         ___________________ d. Body aches/pains                                                         Yes___ No___  ____________________ e. Gastrointestinal symptoms (diarrhea, nausea)           Yes___ No___        ____________________  2. Within the past 14 days, has anyone living in the home had any contact with someone with or under investigation for COVID-19?    Yes___ No_X_   Person __________________

## 2020-05-12 DEATH — deceased

## 2020-08-18 ENCOUNTER — Other Ambulatory Visit: Payer: Medicare Other

## 2020-08-18 ENCOUNTER — Ambulatory Visit: Payer: Medicare Other | Admitting: Oncology
